# Patient Record
Sex: Female | Born: 1990 | Race: White | Hispanic: No | Smoking: Former smoker
Health system: Southern US, Community
[De-identification: ages and names within clinical notes are randomized; demographics above are authoritative.]

## PROBLEM LIST (undated history)

## (undated) ENCOUNTER — Inpatient Hospital Stay: Payer: Self-pay

## (undated) DIAGNOSIS — Q513 Bicornate uterus: Secondary | ICD-10-CM

## (undated) DIAGNOSIS — K219 Gastro-esophageal reflux disease without esophagitis: Secondary | ICD-10-CM

## (undated) DIAGNOSIS — O99210 Obesity complicating pregnancy, unspecified trimester: Secondary | ICD-10-CM

## (undated) DIAGNOSIS — Z8489 Family history of other specified conditions: Secondary | ICD-10-CM

## (undated) DIAGNOSIS — G43909 Migraine, unspecified, not intractable, without status migrainosus: Secondary | ICD-10-CM

## (undated) DIAGNOSIS — A6000 Herpesviral infection of urogenital system, unspecified: Secondary | ICD-10-CM

## (undated) DIAGNOSIS — T4145XA Adverse effect of unspecified anesthetic, initial encounter: Secondary | ICD-10-CM

## (undated) DIAGNOSIS — T8859XA Other complications of anesthesia, initial encounter: Secondary | ICD-10-CM

## (undated) HISTORY — PX: STENT PLACEMENT ILIAC (ARMC HX): HXRAD1735

## (undated) HISTORY — PX: TONSILLECTOMY: SUR1361

## (undated) HISTORY — PX: OTHER SURGICAL HISTORY: SHX169

## (undated) HISTORY — DX: Migraine, unspecified, not intractable, without status migrainosus: G43.909

---
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
## 2011-02-28 ENCOUNTER — Emergency Department: Payer: Self-pay | Admitting: *Deleted

## 2011-03-02 ENCOUNTER — Emergency Department: Payer: Self-pay | Admitting: Internal Medicine

## 2011-09-06 ENCOUNTER — Emergency Department: Payer: Self-pay | Admitting: Emergency Medicine

## 2011-09-06 LAB — URINALYSIS, COMPLETE
Bilirubin,UR: NEGATIVE
Ketone: NEGATIVE
Leukocyte Esterase: NEGATIVE
Nitrite: NEGATIVE
Ph: 6 (ref 4.5–8.0)
Protein: NEGATIVE

## 2011-09-06 LAB — CBC
HGB: 12.3 g/dL (ref 12.0–16.0)
MCH: 29.8 pg (ref 26.0–34.0)
Platelet: 190 10*3/uL (ref 150–440)
RBC: 4.12 10*6/uL (ref 3.80–5.20)
WBC: 5.8 10*3/uL (ref 3.6–11.0)

## 2011-09-06 LAB — PREGNANCY, URINE: Pregnancy Test, Urine: NEGATIVE m[IU]/mL

## 2013-09-17 DIAGNOSIS — R1031 Right lower quadrant pain: Secondary | ICD-10-CM | POA: Insufficient documentation

## 2013-11-04 ENCOUNTER — Emergency Department: Payer: Self-pay | Admitting: Emergency Medicine

## 2013-11-04 LAB — URINALYSIS, COMPLETE
Bilirubin,UR: NEGATIVE
GLUCOSE, UR: NEGATIVE mg/dL (ref 0–75)
Ketone: NEGATIVE
Leukocyte Esterase: NEGATIVE
Nitrite: NEGATIVE
Ph: 5 (ref 4.5–8.0)
RBC,UR: 1097 /HPF (ref 0–5)
Specific Gravity: 1.016 (ref 1.003–1.030)

## 2013-11-04 LAB — CBC
HCT: 37.7 % (ref 35.0–47.0)
HGB: 12.3 g/dL (ref 12.0–16.0)
MCH: 24.7 pg — AB (ref 26.0–34.0)
MCHC: 32.7 g/dL (ref 32.0–36.0)
MCV: 76 fL — ABNORMAL LOW (ref 80–100)
Platelet: 217 10*3/uL (ref 150–440)
RBC: 4.99 10*6/uL (ref 3.80–5.20)
RDW: 17.7 % — AB (ref 11.5–14.5)
WBC: 8.1 10*3/uL (ref 3.6–11.0)

## 2014-09-08 ENCOUNTER — Emergency Department
Admission: EM | Admit: 2014-09-08 | Discharge: 2014-09-08 | Disposition: A | Discharge status: Home / Self Care | Payer: 59 | Attending: Student | Admitting: Student

## 2014-09-08 ENCOUNTER — Emergency Department: Payer: 59

## 2014-09-08 ENCOUNTER — Encounter: Payer: Self-pay | Admitting: Urgent Care

## 2014-09-08 DIAGNOSIS — K148 Other diseases of tongue: Secondary | ICD-10-CM

## 2014-09-08 DIAGNOSIS — R220 Localized swelling, mass and lump, head: Secondary | ICD-10-CM | POA: Insufficient documentation

## 2014-09-08 DIAGNOSIS — J029 Acute pharyngitis, unspecified: Secondary | ICD-10-CM | POA: Diagnosis present

## 2014-09-08 LAB — POCT RAPID STREP A: STREPTOCOCCUS, GROUP A SCREEN (DIRECT): NEGATIVE

## 2014-09-08 MED ORDER — KETOROLAC TROMETHAMINE 60 MG/2ML IM SOLN
INTRAMUSCULAR | Status: AC
  Filled 2014-09-08: qty 2

## 2014-09-08 MED ORDER — DEXAMETHASONE SODIUM PHOSPHATE 10 MG/ML IJ SOLN
INTRAMUSCULAR | Status: AC
  Filled 2014-09-08: qty 1

## 2014-09-08 MED ORDER — METHYLPREDNISOLONE 4 MG PO TBPK: ORAL_TABLET | ORAL | Status: DC

## 2014-09-08 MED ORDER — MAGIC MOUTHWASH W/LIDOCAINE: 5.0000 mL | Freq: Four times a day (QID) | ORAL | Status: DC

## 2014-09-08 MED ORDER — DEXAMETHASONE SODIUM PHOSPHATE 10 MG/ML IJ SOLN
10.0000 mg | Freq: Once | INTRAMUSCULAR | Status: AC
  Administered 2014-09-08: 10 mg via INTRAMUSCULAR

## 2014-09-08 MED ORDER — MAGIC MOUTHWASH
10.0000 mL | Freq: Once | ORAL | Status: AC
  Administered 2014-09-08: 10 mL via ORAL
  Filled 2014-09-08: qty 10

## 2014-09-08 MED ORDER — KETOROLAC TROMETHAMINE 60 MG/2ML IM SOLN
60.0000 mg | Freq: Once | INTRAMUSCULAR | Status: AC
  Administered 2014-09-08: 60 mg via INTRAMUSCULAR

## 2014-09-08 NOTE — ED Notes (Signed)
Patient with sore throat and sensation of laryngeal fullness since yesterday. (+) fever with a tmax of 103.

## 2014-09-08 NOTE — ED Provider Notes (Signed)
[facility] Emergency Department Provider Note  ____________________________________________  Time seen: Approximately 7:44 AM  I have reviewed the triage vital signs and the nursing notes.   HISTORY  Chief Complaint Sore Throat    HPI Sandy [name] is a 24 y.o. female she complains of 2 days of sore throat and fever. He states today she is unable to open her mouth wide. Patient states that she is having trouble swallowing her saliva and she feels a fullness in her neck. Patient states her pain is a 9/10 which increased when she tries to swallow or open her mouth. Patient states no provocative palliative measures for her complaint.   History reviewed. No pertinent past medical history.  There are no active problems to display for this patient.   Past Surgical History  Procedure Laterality Date  . Cesarean section      No current outpatient prescriptions on file.  Allergies Percocet  No family history on file.  Social History History  Substance Use Topics  . Smoking status: Never Smoker   . Smokeless tobacco: Not on file  . Alcohol Use: No    Review of Systems Constitutional: No fever/chills Eyes: No visual changes. ENT: Sore throat Cardiovascular: Denies chest pain. Respiratory: Denies shortness of breath. Gastrointestinal: No abdominal pain.  No nausea, no vomiting.  No diarrhea.  No constipation. Genitourinary: Negative for dysuria. Musculoskeletal: Negative for back pain. Skin: Negative for rash. Neurological: Negative for headaches, focal weakness or numbness. {Allergic/Immunilogical: Percocets 10-point ROS otherwise negative.  ____________________________________________   PHYSICAL EXAM:  VITAL SIGNS: ED Triage Vitals  Enc Vitals Group     BP 09/08/14 0620 130/53 mmHg     Pulse Rate 09/08/14 0620 98     Resp 09/08/14 0620 22     Temp 09/08/14 0620 98.9 F (37.2 C)     Temp Source 09/08/14 0620 Oral     SpO2 09/08/14  0620 98 %     Weight 09/08/14 0620 250 lb (113.399 kg)     Height 09/08/14 0620 5\' 6"  (1.676 m)     Head Cir --      Peak Flow --      Pain Score 09/08/14 0634 9     Pain Loc --      Pain Edu? --      Excl. in GC? --     Constitutional: Alert and oriented. Well appearing and in no acute distress. Eyes: Conjunctivae are normal. PERRL. EOMI. Head: Atraumatic. Nose: No congestion/rhinnorhea. Mouth/Throat: Mucous membranes are moist.  Oropharynx non-erythematous. Decreased range of motion of the mouth secondary to complain of pain. Tonsils are absent. Neck: No stridor. No deformity. Nuchal range of motion nontender palpation Hematological/Lymphatic/Immunilogical: No cervical lymphadenopathy. Cardiovascular: Normal rate, regular rhythm. Grossly normal heart sounds.  Good peripheral circulation. Respiratory: Normal respiratory effort.  No retractions. Lungs CTAB. Gastrointestinal: Soft and nontender. No distention. No abdominal bruits. No CVA tenderness. Musculoskeletal: No lower extremity tenderness nor edema.  No joint effusions. Neurologic:  Normal speech and language. No gross focal neurologic deficits are appreciated. Speech is normal. No gait instability. Skin:  Skin is warm, dry and intact. No rash noted. Psychiatric: Mood and affect are normal. Speech and behavior are normal.  ____________________________________________   LABS (all labs ordered are listed, but only abnormal results are displayed)  Labs Reviewed  CULTURE, GROUP A STREP ([facility] ONLY)  POCT RAPID STREP A   ____________________________________________  EKG   ____________________________________________  RADIOLOGY  Soft tissue neck x-ray  unremarkable except for edema at the base of the tongue. ____________________________________________   PROCEDURES  Procedure(s) performed: None  Critical Care performed: No  ____________________________________________   INITIAL IMPRESSION / ASSESSMENT AND PLAN /  ED COURSE  Pertinent labs & imaging results that were available during my care of the patient were reviewed by me and considered in my medical decision making (see chart for details). Swollen tongue ____________________________________________   FINAL CLINICAL IMPRESSION(S) / ED DIAGNOSES  Final diagnoses:  Tongue edema      Joni Reining, PA-C 09/08/14 0845  Gayla Doss, MD 09/08/14 1453

## 2014-09-08 NOTE — ED Notes (Signed)
Past 2 days c/o sorethroat, pt unable to open mouth enough to visualize

## 2014-09-08 NOTE — Discharge Instructions (Signed)
Take medications as directed and follow up with ENT clinic if condition worsen.

## 2014-09-10 LAB — CULTURE, GROUP A STREP (THRC)

## 2014-09-19 ENCOUNTER — Emergency Department
Admission: EM | Admit: 2014-09-19 | Discharge: 2014-09-19 | Disposition: A | Discharge status: Home / Self Care | Payer: 59 | Attending: Emergency Medicine | Admitting: Emergency Medicine

## 2014-09-19 ENCOUNTER — Encounter: Payer: Self-pay | Admitting: *Deleted

## 2014-09-19 DIAGNOSIS — A6009 Herpesviral infection of other urogenital tract: Secondary | ICD-10-CM

## 2014-09-19 DIAGNOSIS — Z79899 Other long term (current) drug therapy: Secondary | ICD-10-CM | POA: Insufficient documentation

## 2014-09-19 DIAGNOSIS — A6004 Herpesviral vulvovaginitis: Secondary | ICD-10-CM | POA: Insufficient documentation

## 2014-09-19 DIAGNOSIS — Z3202 Encounter for pregnancy test, result negative: Secondary | ICD-10-CM | POA: Insufficient documentation

## 2014-09-19 DIAGNOSIS — N898 Other specified noninflammatory disorders of vagina: Secondary | ICD-10-CM | POA: Diagnosis present

## 2014-09-19 LAB — URINALYSIS COMPLETE WITH MICROSCOPIC (ARMC ONLY)
BILIRUBIN URINE: NEGATIVE
GLUCOSE, UA: NEGATIVE mg/dL
Hgb urine dipstick: NEGATIVE
Ketones, ur: NEGATIVE mg/dL
NITRITE: NEGATIVE
Protein, ur: NEGATIVE mg/dL
Specific Gravity, Urine: 1.018 (ref 1.005–1.030)
pH: 7 (ref 5.0–8.0)

## 2014-09-19 LAB — CHLAMYDIA/NGC RT PCR (ARMC ONLY)
Chlamydia Tr: NOT DETECTED
N gonorrhoeae: NOT DETECTED

## 2014-09-19 LAB — HCG, QUANTITATIVE, PREGNANCY: hCG, Beta Chain, Quant, S: <1 m[IU]/mL (ref <5)

## 2014-09-19 LAB — WET PREP, GENITAL
Clue Cells Wet Prep HPF POC: NONE SEEN
TRICH WET PREP: NONE SEEN
YEAST WET PREP: NONE SEEN

## 2014-09-19 LAB — POCT PREGNANCY, URINE: Preg Test, Ur: NEGATIVE

## 2014-09-19 MED ORDER — ACYCLOVIR 200 MG PO CAPS: 200.0000 mg | ORAL_CAPSULE | Freq: Three times a day (TID) | ORAL | Status: AC

## 2014-09-19 NOTE — ED Provider Notes (Signed)
[facility] Emergency Department Provider Note  ____________________________________________  Time seen: Approximately 8:40 PM  I have reviewed the triage vital signs and the nursing notes.   HISTORY  Chief Complaint Back Pain and Vaginal Discharge    HPI Sandy [name] is a 24 y.o. female who presents to the emergency department for lower back pain, vaginal discharge, and to confirm pregnancy. She states that a home pregnancy test was "barely positive". Last menstrual cycle was the end of April into beginning of May. She also states that she is herpes positive and is having an outbreak. She has burning to the lesions with urination.   History reviewed. No pertinent past medical history.  There are no active problems to display for this patient.   Past Surgical History  Procedure Laterality Date  . Cesarean section      Current Outpatient Rx  Name  Route  Sig  Dispense  Refill  . acyclovir (ZOVIRAX) 200 MG capsule   Oral   Take 1 capsule (200 mg total) by mouth 3 (three) times daily.   30 capsule   0   . Alum & Mag Hydroxide-Simeth (MAGIC MOUTHWASH W/LIDOCAINE) SOLN   Oral   Take 5 mLs by mouth 4 (four) times daily.   100 mL   0   . methylPREDNISolone (MEDROL DOSEPAK) 4 MG TBPK tablet      Take Tapered dose as directed   21 tablet   0     Allergies Percocet  No family history on file.  Social History History  Substance Use Topics  . Smoking status: Never Smoker   . Smokeless tobacco: Not on file  . Alcohol Use: No    Review of Systems Constitutional: No fever/chills Cardiovascular: Denies chest pain. Respiratory: Denies shortness of breath or cough. Gastrointestinal: Abdominal pain no., nausea no, vomitingno. Genitourinary: Dysuria no, vaginal discharge yes.. Musculoskeletal: Negative for back pain. Skin: Negative for rash. Neurological: Negative for headaches, focal weakness or numbness.  10-point ROS otherwise  negative.  ____________________________________________   PHYSICAL EXAM:  VITAL SIGNS: ED Triage Vitals  Enc Vitals Group     BP 09/19/14 1959 150/91 mmHg     Pulse Rate 09/19/14 1959 87     Resp 09/19/14 1959 20     Temp 09/19/14 1959 99.1 F (37.3 C)     Temp src --      SpO2 09/19/14 1959 98 %     Weight 09/19/14 1959 260 lb (117.935 kg)     Height 09/19/14 1959 5\' 6"  (1.676 m)     Head Cir --      Peak Flow --      Pain Score 09/19/14 2001 4     Pain Loc --      Pain Edu? --      Excl. in GC? --     Constitutional: Alert and oriented. Well appearing and in no acute distress. Eyes: Conjunctivae are normal. PERRL. EOMI. Head: Atraumatic. Nose: No congestion/rhinnorhea. Mouth/Throat: Mucous membranes are moist.  Oropharynx non-erythematous. Neck: No stridor. Cardiovascular: Good peripheral circulation. Respiratory: Normal respiratory effort.  No retractions. Gastrointestinal: Soft and nontender. No distention. No abdominal bruits. Genitourinary: Pelvic exam: 2 herpetic lesions present to right upper labia majora, thin clear discharge noted on exam. No bleeding in the vaginal vault or cervical bleeding. Cervix is closed. No adnexal tenderness present on exam. Musculoskeletal: No extremity tenderness nor edema.  Neurologic:  Normal speech and language. No gross focal neurologic deficits are appreciated.  Speech is normal. No gait instability. Skin:  Skin is warm, dry and intact. No rash noted. Psychiatric: Mood and affect are normal. Speech and behavior are normal.  ____________________________________________   LABS (all labs ordered are listed, but only abnormal results are displayed)  Labs Reviewed  WET PREP, GENITAL - Abnormal; Notable for the following:    WBC, Wet Prep HPF POC RARE (*)    All other components within normal limits  URINALYSIS COMPLETEWITH MICROSCOPIC ([facility] ONLY) - Abnormal; Notable for the following:    Color, Urine YELLOW (*)    APPearance  CLOUDY (*)    Leukocytes, UA TRACE (*)    Bacteria, UA RARE (*)    Squamous Epithelial / LPF 6-30 (*)    All other components within normal limits  CHLAMYDIA/NGC RT PCR ([facility] ONLY)  HCG, QUANTITATIVE, PREGNANCY  POC URINE PREG, ED  POCT PREGNANCY, URINE   ____________________________________________  RADIOLOGY  Not indicated ____________________________________________   PROCEDURES  Procedure(s) performed: Pelvic exam completed see assessment.  ____________________________________________   INITIAL IMPRESSION / ASSESSMENT AND PLAN / ED COURSE  Pertinent labs & imaging results that were available during my care of the patient were reviewed by me and considered in my medical decision making (see chart for details).  Beta hCG serum testing ordered to confirm pregnancy. GC and chlamydia specimens obtained as well as wet prep. Awaiting results.  Patient was discharged home after having a negative serum beta hCG and a negative wet prep. She was advised that she did have a small amount of bacteria and the wet prep. She was advised that the [facility] and Chlamydia screenings are still in progress and will not be available until tomorrow. She is to follow-up with her gynecologist to discuss management of her herpes outbreak. She received a prescription for acyclovir tonight. ____________________________________________   FINAL CLINICAL IMPRESSION(S) / ED DIAGNOSES  Final diagnoses:  Herpes genitalis in women      [name], FNP 09/19/14 2238  [name], MD 09/20/14 [phone]

## 2014-09-19 NOTE — ED Notes (Signed)
Pt has low back pain.  Pt reports vaginal discharge and positive home pregnancy test today.  No vag bleeding now.  Pt has dysuria.

## 2014-09-19 NOTE — Discharge Instructions (Signed)
Genital Herpes °Genital herpes is a sexually transmitted disease. This means that it is a disease passed by having sex with an infected person. There is no cure for genital herpes. The time between attacks can be months to years. The virus may live in a person but produce no problems (symptoms). This infection can be passed to a baby as it travels down the birth canal (vagina). In a newborn, this can cause central nervous system damage, eye damage, or even death. The virus that causes genital herpes is usually HSV-2 virus. The virus that causes oral herpes is usually HSV-1. The diagnosis (learning what is wrong) is made through culture results. °SYMPTOMS  °Usually symptoms of pain and itching begin a few days to a week after contact. It first appears as small blisters that progress to small painful ulcers which then scab over and heal after several days. It affects the outer genitalia, birth canal, cervix, penis, anal area, buttocks, and thighs. °HOME CARE INSTRUCTIONS  °· Keep ulcerated areas dry and clean. °· Take medications as directed. Antiviral medications can speed up healing. They will not prevent recurrences or cure this infection. These medications can also be taken for suppression if there are frequent recurrences. °· While the infection is active, it is contagious. Avoid all sexual contact during active infections. °· Condoms may help prevent spread of the herpes virus. °· Practice safe sex. °· Wash your hands thoroughly after touching the genital area. °· Avoid touching your eyes after touching your genital area. °· Inform your caregiver if you have had genital herpes and become pregnant. It is your responsibility to insure a safe outcome for your baby in this pregnancy. °· Only take over-the-counter or prescription medicines for pain, discomfort, or fever as directed by your caregiver. °SEEK MEDICAL CARE IF:  °· You have a recurrence of this infection. °· You do not respond to medications and are not  improving. °· You have new sources of pain or discharge which have changed from the original infection. °· You have an oral temperature above 102° F (38.9° C). °· You develop abdominal pain. °· You develop eye pain or signs of eye infection. °Document Released: 03/23/2000 Document Revised: 06/18/2011 Document Reviewed: 04/13/2009 °ExitCare® Patient Information ©2015 [employer]. This information is not intended to replace advice given to you by your health care provider. Make sure you discuss any questions you have with your health care provider. ° °

## 2014-09-19 NOTE — ED Notes (Signed)
Pt c/o low back pain, recent positive pregnancy test, dysuria and recent hx of allergic reaction to abx and uterine fibroids.

## 2015-03-01 ENCOUNTER — Encounter: Payer: Self-pay | Admitting: *Deleted

## 2015-03-01 ENCOUNTER — Emergency Department: Payer: 59

## 2015-03-01 ENCOUNTER — Emergency Department
Admission: EM | Admit: 2015-03-01 | Discharge: 2015-03-01 | Disposition: A | Discharge status: Home / Self Care | Payer: 59 | Attending: Emergency Medicine | Admitting: Emergency Medicine

## 2015-03-01 DIAGNOSIS — Z3202 Encounter for pregnancy test, result negative: Secondary | ICD-10-CM | POA: Insufficient documentation

## 2015-03-01 DIAGNOSIS — R1011 Right upper quadrant pain: Secondary | ICD-10-CM | POA: Diagnosis not present

## 2015-03-01 DIAGNOSIS — R1013 Epigastric pain: Secondary | ICD-10-CM | POA: Insufficient documentation

## 2015-03-01 DIAGNOSIS — R109 Unspecified abdominal pain: Secondary | ICD-10-CM | POA: Diagnosis present

## 2015-03-01 DIAGNOSIS — Z79899 Other long term (current) drug therapy: Secondary | ICD-10-CM | POA: Insufficient documentation

## 2015-03-01 DIAGNOSIS — R110 Nausea: Secondary | ICD-10-CM

## 2015-03-01 LAB — CBC
HCT: 40.2 % (ref 35.0–47.0)
Hemoglobin: 13.4 g/dL (ref 12.0–16.0)
MCH: 25.7 pg — AB (ref 26.0–34.0)
MCHC: 33.4 g/dL (ref 32.0–36.0)
MCV: 76.8 fL — AB (ref 80.0–100.0)
Platelets: 206 10*3/uL (ref 150–440)
RBC: 5.23 MIL/uL — ABNORMAL HIGH (ref 3.80–5.20)
RDW: 16.3 % — AB (ref 11.5–14.5)
WBC: 8 10*3/uL (ref 3.6–11.0)

## 2015-03-01 LAB — URINALYSIS COMPLETE WITH MICROSCOPIC (ARMC ONLY)
Bilirubin Urine: NEGATIVE
GLUCOSE, UA: NEGATIVE mg/dL
HGB URINE DIPSTICK: NEGATIVE
Leukocytes, UA: NEGATIVE
Nitrite: NEGATIVE
PH: 5 (ref 5.0–8.0)
Protein, ur: NEGATIVE mg/dL
Specific Gravity, Urine: 1.04 — ABNORMAL HIGH (ref 1.005–1.030)

## 2015-03-01 LAB — COMPREHENSIVE METABOLIC PANEL
ALBUMIN: 3.8 g/dL (ref 3.5–5.0)
ALT: 14 U/L (ref 14–54)
AST: 18 U/L (ref 15–41)
Alkaline Phosphatase: 89 U/L (ref 38–126)
Anion gap: 6 (ref 5–15)
BUN: 9 mg/dL (ref 6–20)
CHLORIDE: 105 mmol/L (ref 101–111)
CO2: 27 mmol/L (ref 22–32)
Calcium: 9.1 mg/dL (ref 8.9–10.3)
Creatinine, Ser: 0.7 mg/dL (ref 0.44–1.00)
GFR calc Af Amer: >60 mL/min (ref >60)
Glucose, Bld: 103 mg/dL — ABNORMAL HIGH (ref 65–99)
POTASSIUM: 3.5 mmol/L (ref 3.5–5.1)
SODIUM: 138 mmol/L (ref 135–145)
Total Bilirubin: 0.5 mg/dL (ref 0.3–1.2)
Total Protein: 7.3 g/dL (ref 6.5–8.1)

## 2015-03-01 LAB — LIPASE, BLOOD: LIPASE: 23 U/L (ref 11–51)

## 2015-03-01 LAB — POCT PREGNANCY, URINE: PREG TEST UR: NEGATIVE

## 2015-03-01 MED ORDER — PANTOPRAZOLE SODIUM 40 MG PO TBEC: 40.0000 mg | DELAYED_RELEASE_TABLET | Freq: Every day | ORAL | Status: DC

## 2015-03-01 MED ORDER — GI COCKTAIL ~~LOC~~
30.0000 mL | Freq: Once | ORAL | Status: AC
  Administered 2015-03-01: 30 mL via ORAL
  Filled 2015-03-01: qty 30

## 2015-03-01 MED ORDER — ONDANSETRON 4 MG PO TBDP
4.0000 mg | ORAL_TABLET | Freq: Once | ORAL | Status: AC
Start: 2015-03-01 — End: 2015-03-01
  Administered 2015-03-01: 4 mg via ORAL

## 2015-03-01 MED ORDER — GI COCKTAIL ~~LOC~~: 30.0000 mL | Freq: Once | ORAL | Status: DC

## 2015-03-01 MED ORDER — ONDANSETRON 4 MG PO TBDP
ORAL_TABLET | ORAL | Status: AC
  Administered 2015-03-01: 4 mg via ORAL
  Filled 2015-03-01: qty 1

## 2015-03-01 MED ORDER — ONDANSETRON HCL 4 MG PO TABS: 4.0000 mg | ORAL_TABLET | Freq: Every day | ORAL | Status: DC | PRN

## 2015-03-01 MED ORDER — SUCRALFATE 1 G PO TABS: 1.0000 g | ORAL_TABLET | Freq: Four times a day (QID) | ORAL | Status: DC

## 2015-03-01 NOTE — Discharge Instructions (Signed)
Please seek medical attention for any high fevers, chest pain, shortness of breath, change in behavior, persistent vomiting, bloody stool or any other new or concerning symptoms. ° ° °Abdominal Pain, Adult °Many things can cause abdominal pain. Usually, abdominal pain is not caused by a disease and will improve without treatment. It can often be observed and treated at home. Your health care provider will do a physical exam and possibly order blood tests and X-rays to help determine the seriousness of your pain. However, in many cases, more time must pass before a clear cause of the pain can be found. Before that point, your health care provider may not know if you need more testing or further treatment. °HOME CARE INSTRUCTIONS °Monitor your abdominal pain for any changes. The following actions may help to alleviate any discomfort you are experiencing: °· Only take over-the-counter or prescription medicines as directed by your health care provider. °· Do not take laxatives unless directed to do so by your health care provider. °· Try a clear liquid diet (broth, tea, or water) as directed by your health care provider. Slowly move to a bland diet as tolerated. °SEEK MEDICAL CARE IF: °· You have unexplained abdominal pain. °· You have abdominal pain associated with nausea or diarrhea. °· You have pain when you urinate or have a bowel movement. °· You experience abdominal pain that wakes you in the night. °· You have abdominal pain that is worsened or improved by eating food. °· You have abdominal pain that is worsened with eating fatty foods. °· You have a fever. °SEEK IMMEDIATE MEDICAL CARE IF: °· Your pain does not go away within 2 hours. °· You keep throwing up (vomiting). °· Your pain is felt only in portions of the abdomen, such as the right side or the left lower portion of the abdomen. °· You pass bloody or black tarry stools. °MAKE SURE YOU: °· Understand these instructions. °· Will watch your  condition. °· Will get help right away if you are not doing well or get worse. °  °This information is not intended to replace advice given to you by your health care provider. Make sure you discuss any questions you have with your health care provider. °  °Document Released: 01/03/2005 Document Revised: 12/15/2014 Document Reviewed: 12/03/2012 °Elsevier Interactive Patient Education ©2016 [employer] ° °

## 2015-03-01 NOTE — ED Provider Notes (Signed)
[facility] Emergency Department Provider Note    ____________________________________________  Time seen: 1950  I have reviewed the triage vital signs and the nursing notes.   HISTORY  Chief Complaint Abdominal Cramping   History limited by: Not Limited   HPI Sandy [name] is a 24 y.o. female who presents to the emergency department today with concerns for right upper quadrant and abdominal pain. The patient states that the pain has been present for the past 1-2 weeks. She describes it as starting in the epigastric before radiating into the right upper quadrant. I will then sometimes go down her stomach and towards her back. She states it is worse when she eats food in particular she mentioned fried chicken as a trigger. She states it has been intermittent. She denies similar pain prior to 2 weeks ago. She denies any recent alcohol ingestion or over-the-counter pain medication. States her uncle had a history of gallstones. She denies any fevers.   History reviewed. No pertinent past medical history.  There are no active problems to display for this patient.   Past Surgical History  Procedure Laterality Date  . Cesarean section    . Cesarean section      Current Outpatient Rx  Name  Route  Sig  Dispense  Refill  . Alum & Mag Hydroxide-Simeth (MAGIC MOUTHWASH W/LIDOCAINE) SOLN   Oral   Take 5 mLs by mouth 4 (four) times daily.   100 mL   0   . methylPREDNISolone (MEDROL DOSEPAK) 4 MG TBPK tablet      Take Tapered dose as directed   21 tablet   0     Allergies Percocet  No family history on file.  Social History Social History  Substance Use Topics  . Smoking status: Never Smoker   . Smokeless tobacco: None  . Alcohol Use: No    Review of Systems  Constitutional: Negative for fever. Cardiovascular: Negative for chest pain. Respiratory: Negative for shortness of breath. Gastrointestinal: Positive for epigastric right upper  quadrant pain Genitourinary: Negative for dysuria. Musculoskeletal: Negative for back pain. Skin: Negative for rash. Neurological: Negative for headaches, focal weakness or numbness.  10-point ROS otherwise negative.  ____________________________________________   PHYSICAL EXAM:  VITAL SIGNS: ED Triage Vitals  Enc Vitals Group     BP 03/01/15 1903 135/84 mmHg     Pulse Rate 03/01/15 1903 97     Resp 03/01/15 1903 20     Temp 03/01/15 1903 98.7 F (37.1 C)     Temp Source 03/01/15 1903 Oral     SpO2 03/01/15 1903 100 %     Weight 03/01/15 1903 240 lb (108.863 kg)     Height 03/01/15 1903  (1.676 m)     Head Cir --      Peak Flow --      Pain Score 03/01/15 1904 9   Constitutional: Alert and oriented. Well appearing and in no distress. Eyes: Conjunctivae are normal. PERRL. Normal extraocular movements. ENT   Head: Normocephalic and atraumatic.   Nose: No congestion/rhinnorhea.   Mouth/Throat: Mucous membranes are moist.   Neck: No stridor. Hematological/Lymphatic/Immunilogical: No cervical lymphadenopathy. Cardiovascular: Normal rate, regular rhythm.  No murmurs, rubs, or gallops. Respiratory: Normal respiratory effort without tachypnea nor retractions. Breath sounds are clear and equal bilaterally. No wheezes/rales/rhonchi. Gastrointestinal: Soft. Tender to palpation in the epigastric and right upper quadrant. Genitourinary: Deferred Musculoskeletal: Normal range of motion in all extremities. No joint effusions.  No lower extremity tenderness  nor edema. Neurologic:  Normal speech and language. No gross focal neurologic deficits are appreciated.  Skin:  Skin is warm, dry and intact. No rash noted. Psychiatric: Mood and affect are normal. Speech and behavior are normal. Patient exhibits appropriate insight and judgment.  ____________________________________________    LABS (pertinent positives/negatives)  Labs Reviewed  COMPREHENSIVE METABOLIC PANEL -  Abnormal; Notable for the following:    Glucose, Bld 103 (*)    All other components within normal limits  CBC - Abnormal; Notable for the following:    RBC 5.23 (*)    MCV 76.8 (*)    MCH 25.7 (*)    RDW 16.3 (*)    All other components within normal limits  URINALYSIS COMPLETEWITH MICROSCOPIC ([facility] ONLY) - Abnormal; Notable for the following:    Color, Urine YELLOW (*)    APPearance CLEAR (*)    Ketones, ur TRACE (*)    Specific Gravity, Urine 1.040 (*)    Bacteria, UA RARE (*)    Squamous Epithelial / LPF 6-30 (*)    All other components within normal limits  LIPASE, BLOOD  POC URINE PREG, ED  POCT PREGNANCY, URINE     ____________________________________________   EKG  I, Phineas SemenGraydon Brainard Highfill, attending physician, personally viewed and interpreted this EKG  EKG Time: 1930 Rate: 87 Rhythm: Normal sinus rhythm Axis: normal Intervals: qtc 423 QRS: narrow ST changes: no st elevation Impression: normal ekg ____________________________________________    RADIOLOGY  RUQ US  IMPRESSION: No evidence of cholelithiasis or cholecystitis. Probable fatty infiltration in the liver.   ____________________________________________   PROCEDURES  Procedure(s) performed: None  Critical Care performed: No  ____________________________________________   INITIAL IMPRESSION / ASSESSMENT AND PLAN / ED COURSE  Pertinent labs & imaging results that were available during my care of the patient were reviewed by me and considered in my medical decision making (see chart for details).  Patient presented to the emergency department today with right upper quadrant and epigastric pain that was worse after eating. Exam patient was tender in the epigastric right upper quadrant. An ultrasound was obtained however did not show any signs of gallstones. Additionally blood work including lipase and LFTs within normal limits. No leukocytosis. Patient afebrile. This point I think potentially  patient is suffering from gastritis. Will give patient an acids and sucralfate. Additionally did discuss with patient importance of following up with GI given that I do have some thought it could still be biliary in nature.  ____________________________________________   FINAL CLINICAL IMPRESSION(S) / ED DIAGNOSES  Final diagnoses:  RUQ pain  Abdominal pain, unspecified abdominal location  Nausea     Phineas SemenGraydon Zayne Draheim, MD 03/01/15 2141

## 2015-03-01 NOTE — ED Notes (Signed)
Pain across upper abd and down right flank

## 2015-03-28 ENCOUNTER — Encounter: Payer: Self-pay | Admitting: Emergency Medicine

## 2015-03-28 ENCOUNTER — Emergency Department
Admission: EM | Admit: 2015-03-28 | Discharge: 2015-03-28 | Disposition: A | Discharge status: Home / Self Care | Payer: 59 | Attending: Student | Admitting: Student

## 2015-03-28 ENCOUNTER — Emergency Department: Payer: 59

## 2015-03-28 DIAGNOSIS — O039 Complete or unspecified spontaneous abortion without complication: Secondary | ICD-10-CM | POA: Insufficient documentation

## 2015-03-28 DIAGNOSIS — R1031 Right lower quadrant pain: Secondary | ICD-10-CM | POA: Diagnosis not present

## 2015-03-28 DIAGNOSIS — O200 Threatened abortion: Secondary | ICD-10-CM

## 2015-03-28 DIAGNOSIS — R109 Unspecified abdominal pain: Secondary | ICD-10-CM

## 2015-03-28 DIAGNOSIS — Z3A01 Less than 8 weeks gestation of pregnancy: Secondary | ICD-10-CM | POA: Insufficient documentation

## 2015-03-28 DIAGNOSIS — O9989 Other specified diseases and conditions complicating pregnancy, childbirth and the puerperium: Secondary | ICD-10-CM | POA: Diagnosis present

## 2015-03-28 DIAGNOSIS — O26899 Other specified pregnancy related conditions, unspecified trimester: Secondary | ICD-10-CM

## 2015-03-28 DIAGNOSIS — Z79899 Other long term (current) drug therapy: Secondary | ICD-10-CM | POA: Insufficient documentation

## 2015-03-28 DIAGNOSIS — Z7952 Long term (current) use of systemic steroids: Secondary | ICD-10-CM | POA: Insufficient documentation

## 2015-03-28 DIAGNOSIS — R52 Pain, unspecified: Secondary | ICD-10-CM

## 2015-03-28 LAB — COMPREHENSIVE METABOLIC PANEL
ALT: 18 U/L (ref 14–54)
ANION GAP: 6 (ref 5–15)
AST: 16 U/L (ref 15–41)
Albumin: 3.6 g/dL (ref 3.5–5.0)
Alkaline Phosphatase: 66 U/L (ref 38–126)
BILIRUBIN TOTAL: 0.4 mg/dL (ref 0.3–1.2)
BUN: 9 mg/dL (ref 6–20)
CO2: 24 mmol/L (ref 22–32)
Calcium: 8.6 mg/dL — ABNORMAL LOW (ref 8.9–10.3)
Chloride: 107 mmol/L (ref 101–111)
Creatinine, Ser: 0.68 mg/dL (ref 0.44–1.00)
GFR calc Af Amer: >60 mL/min (ref >60)
Glucose, Bld: 91 mg/dL (ref 65–99)
POTASSIUM: 3.6 mmol/L (ref 3.5–5.1)
Sodium: 137 mmol/L (ref 135–145)
TOTAL PROTEIN: 7.1 g/dL (ref 6.5–8.1)

## 2015-03-28 LAB — URINALYSIS COMPLETE WITH MICROSCOPIC (ARMC ONLY)
Bilirubin Urine: NEGATIVE
GLUCOSE, UA: NEGATIVE mg/dL
Hgb urine dipstick: NEGATIVE
NITRITE: NEGATIVE
Protein, ur: NEGATIVE mg/dL
SPECIFIC GRAVITY, URINE: 1.033 — AB (ref 1.005–1.030)
pH: 5 (ref 5.0–8.0)

## 2015-03-28 LAB — CBC WITH DIFFERENTIAL/PLATELET
Basophils Absolute: 0 10*3/uL (ref 0–0.1)
Basophils Relative: 1 %
Eosinophils Absolute: 0.2 10*3/uL (ref 0–0.7)
Eosinophils Relative: 3 %
HEMATOCRIT: 38.5 % (ref 35.0–47.0)
Hemoglobin: 13 g/dL (ref 12.0–16.0)
LYMPHS ABS: 1.8 10*3/uL (ref 1.0–3.6)
LYMPHS PCT: 24 %
MCH: 26.3 pg (ref 26.0–34.0)
MCHC: 33.6 g/dL (ref 32.0–36.0)
MCV: 78.3 fL — AB (ref 80.0–100.0)
MONO ABS: 0.5 10*3/uL (ref 0.2–0.9)
MONOS PCT: 6 %
NEUTROS ABS: 5.1 10*3/uL (ref 1.4–6.5)
Neutrophils Relative %: 66 %
Platelets: 195 10*3/uL (ref 150–440)
RBC: 4.92 MIL/uL (ref 3.80–5.20)
RDW: 16.8 % — AB (ref 11.5–14.5)
WBC: 7.6 10*3/uL (ref 3.6–11.0)

## 2015-03-28 LAB — WET PREP, GENITAL
Clue Cells Wet Prep HPF POC: NONE SEEN
SPERM: NONE SEEN
Trich, Wet Prep: NONE SEEN
YEAST WET PREP: NONE SEEN

## 2015-03-28 LAB — HCG, QUANTITATIVE, PREGNANCY: hCG, Beta Chain, Quant, S: 17111 m[IU]/mL — ABNORMAL HIGH (ref <5)

## 2015-03-28 LAB — LIPASE, BLOOD: LIPASE: 21 U/L (ref 11–51)

## 2015-03-28 LAB — ABO/RH: ABO/RH(D): A POS

## 2015-03-28 LAB — PREGNANCY, URINE: Preg Test, Ur: POSITIVE — AB

## 2015-03-28 NOTE — ED Notes (Signed)
Pt presents with abdominal pain x 2 days. States she was seen here two weeks ago and was given medications for pain and nausea but is now out. Pt alert & oriented with warm, dry skin. NAD noted.

## 2015-03-28 NOTE — ED Notes (Signed)
Developed some abd cramping and some vaginal spotting this am. Positive preg

## 2015-03-28 NOTE — ED Notes (Addendum)
Pt reports being seen here a few weeks ago and given meds that help her abd pain and nausea. States now that the meds are gone the pain is back. States she took a pregnancy test today and it was positive. States she is worried that taking those meds have hurt her baby

## 2015-03-28 NOTE — ED Provider Notes (Signed)
[facility] Emergency Department Provider Note  ____________________________________________  Time seen: Approximately 5:26 PM  I have reviewed the triage vital signs and the nursing notes.   HISTORY  Chief Complaint Abdominal Pain    HPI Sandy [name] is a 24 y.o. female with history of ovarian cysts presents for evaluation of right lower quadrant abdominal pain today as well as some light vaginal spotting, gradual onset today, intermittent, currently mild, no modifying factors. Last menstrual period was in October and she took a pregnancy test today and it was positive. She denies any nausea, vomiting, diarrhea, fevers or chills. She was seen here on 03/01/2015 with right upper quadrant and epigastric pain with a normal ultrasound and was discharged with a diagnosis of gastritis as well as with medications to treat her symptoms. She reports that pain has resolved.   History reviewed. No pertinent past medical history.  There are no active problems to display for this patient.   Past Surgical History  Procedure Laterality Date  . Cesarean section    . Cesarean section      Current Outpatient Rx  Name  Route  Sig  Dispense  Refill  . Alum & Mag Hydroxide-Simeth (MAGIC MOUTHWASH W/LIDOCAINE) SOLN   Oral   Take 5 mLs by mouth 4 (four) times daily.   100 mL   0   . methylPREDNISolone (MEDROL DOSEPAK) 4 MG TBPK tablet      Take Tapered dose as directed   21 tablet   0   . ondansetron (ZOFRAN) 4 MG tablet   Oral   Take 1 tablet (4 mg total) by mouth daily as needed for nausea or vomiting.   20 tablet   0   . pantoprazole (PROTONIX) 40 MG tablet   Oral   Take 1 tablet (40 mg total) by mouth daily.   30 tablet   1   . sucralfate (CARAFATE) 1 G tablet   Oral   Take 1 tablet (1 g total) by mouth 4 (four) times daily.   60 tablet   0     Allergies Percocet  History reviewed. No pertinent family history.  Social History Social History   Substance Use Topics  . Smoking status: Never Smoker   . Smokeless tobacco: None  . Alcohol Use: No    Review of Systems Constitutional: No fever/chills Eyes: No visual changes. ENT: No sore throat. Cardiovascular: Denies chest pain. Respiratory: Denies shortness of breath. Gastrointestinal: + abdominal pain.  No nausea, no vomiting.  No diarrhea.  No constipation. Genitourinary: Negative for dysuria. Musculoskeletal: Negative for back pain. Skin: Negative for rash. Neurological: Negative for headaches, focal weakness or numbness.  10-point ROS otherwise negative.  ____________________________________________   PHYSICAL EXAM:  Filed Vitals:   03/28/15 1958 03/28/15 2056  BP: 118/78   Pulse: 83   Temp:  98.4 F (36.9 C)  TempSrc:  Oral  Resp: 20   SpO2: 98%     VITAL SIGNS: ED Triage Vitals  Enc Vitals Group     BP --      Pulse --      Resp --      Temp --      Temp src --      SpO2 --      Weight --      Height --      Head Cir --      Peak Flow --      Pain Score --  Pain Loc --      Pain Edu? --      Excl. in GC? --     Constitutional: Alert and oriented. Well appearing and in no acute distress. Eyes: Conjunctivae are normal. PERRL. EOMI. Head: Atraumatic. Nose: No congestion/rhinnorhea. Mouth/Throat: Mucous membranes are moist.  Oropharynx non-erythematous. Neck: No stridor. Cardiovascular: Normal rate, regular rhythm. Grossly normal heart sounds.  Good peripheral circulation. Respiratory: Normal respiratory effort.  No retractions. Lungs CTAB. Gastrointestinal: Soft and nontender. No distention.  No CVA tenderness. Pelvic: Scant white non-malodorous vaginal discharge from a closed os. No bimanual tenderness, no cervical motion tenderness. No blood. Musculoskeletal: No lower extremity tenderness nor edema.  No joint effusions. Neurologic:  Normal speech and language. No gross focal neurologic deficits are appreciated. No gait  instability. Skin:  Skin is warm, dry and intact. No rash noted. Psychiatric: Mood and affect are normal. Speech and behavior are normal.  ____________________________________________   LABS (all labs ordered are listed, but only abnormal results are displayed)  Labs Reviewed  WET PREP, GENITAL - Abnormal; Notable for the following:    WBC, Wet Prep HPF POC FEW (*)    All other components within normal limits  URINALYSIS COMPLETEWITH MICROSCOPIC ([facility] ONLY) - Abnormal; Notable for the following:    Color, Urine YELLOW (*)    APPearance HAZY (*)    Ketones, ur TRACE (*)    Specific Gravity, Urine 1.033 (*)    Leukocytes, UA TRACE (*)    Bacteria, UA RARE (*)    Squamous Epithelial / LPF 6-30 (*)    All other components within normal limits  PREGNANCY, URINE - Abnormal; Notable for the following:    Preg Test, Ur POSITIVE (*)    All other components within normal limits  CBC WITH DIFFERENTIAL/PLATELET - Abnormal; Notable for the following:    MCV 78.3 (*)    RDW 16.8 (*)    All other components within normal limits  COMPREHENSIVE METABOLIC PANEL - Abnormal; Notable for the following:    Calcium 8.6 (*)    All other components within normal limits  HCG, QUANTITATIVE, PREGNANCY - Abnormal; Notable for the following:    hCG, Beta Chain, Quant, S 17111 (*)    All other components within normal limits  CHLAMYDIA/NGC RT PCR ([facility] ONLY)  LIPASE, BLOOD  POC URINE PREG, ED  ABO/RH   ____________________________________________  EKG  none ____________________________________________  RADIOLOGY  TVUS  Pulsed Doppler evaluation of both ovaries demonstrates normal appearing low-resistance arterial and venous waveforms.  IMPRESSION: 1. Single intrauterine gestational sac containing a yolk sac and small fetal pole. While there is a suggestion of a flicker indicating cardiac activity, the fetal pole is too small to definitively measure a heart rate. By crown-rump  length, the estimated gestational age is 5 weeks 5 days. 2. 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 The gestational sac is in the left horn of the endometrium.  ____________________________________________   PROCEDURES  Procedure(s) performed: None  Critical Care performed: No  ____________________________________________   INITIAL IMPRESSION / ASSESSMENT AND PLAN / ED COURSE  Pertinent labs & imaging results that were available during my care of the patient were reviewed by me and considered in my medical decision making (see chart for details).  Reece LeaderJasmine M Sollars is a 24 y.o. female with history of ovarian cysts presents for evaluation of right lower quadrant abdominal pain today as well as some light vaginal spotting in the setting of positive pregnancy test.  On exam, she is very well-appearing and in no acute distress. Vital signs stable, she is afebrile. Abdominal exam is benign. Pelvic is unremarkable. She has a positive pregnancy test here. Plan for screening abdominal pain labs, we'll check Rh status, will obtain ultrasound to rule out ectopic.  ----------------------------------------- 8:52 PM on 03/28/2015 ----------------------------------------- Labs reviewed. Unremarkable CBC, CMP, lipase. Urinalysis has is a with infection. Wet prep negative. GC chlamydia pending. Beta hCG is pending but will not change management at this time. Single live intrauterine pregnancy is noted at 5 weeks and 5 days estimated gestational age. I discussed with the patient that she has an abnormal uterine shape which may be arcuate or bicornuate, she has had a successful pregnancy previously but I discussed the need for close OB/GYN follow-up. She is A+ blood type, no RhoGAM needed. We discussed return precautions, need for close follow-up, possibility of early miscarriage, she is comfort with the discharge  plan.  ____________________________________________   FINAL CLINICAL IMPRESSION(S) / ED DIAGNOSES  Final diagnoses:  Pain  Threatened miscarriage in early pregnancy  Abdominal pain in pregnancy      Gayla Doss, MD 03/28/15 2311

## 2015-03-30 ENCOUNTER — Telehealth: Payer: Self-pay | Admitting: Emergency Medicine

## 2015-03-30 NOTE — ED Notes (Signed)
Called patient to inform her that gc chlamydia test was not done due to invalid specimen.  She says she had visit with dr Tiburcio Peaharris already and they have her records, but she will inform them.

## 2015-08-07 ENCOUNTER — Observation Stay
Admission: EM | Admit: 2015-08-07 | Discharge: 2015-08-07 | Disposition: A | Discharge status: Home / Self Care | Payer: 59 | Attending: Certified Nurse Midwife | Admitting: Certified Nurse Midwife

## 2015-08-07 ENCOUNTER — Encounter: Payer: Self-pay | Admitting: Certified Nurse Midwife

## 2015-08-07 DIAGNOSIS — O9989 Other specified diseases and conditions complicating pregnancy, childbirth and the puerperium: Secondary | ICD-10-CM | POA: Diagnosis not present

## 2015-08-07 DIAGNOSIS — Z6841 Body Mass Index (BMI) 40.0 and over, adult: Secondary | ICD-10-CM | POA: Diagnosis not present

## 2015-08-07 DIAGNOSIS — S3991XA Unspecified injury of abdomen, initial encounter: Secondary | ICD-10-CM | POA: Diagnosis not present

## 2015-08-07 DIAGNOSIS — O34219 Maternal care for unspecified type scar from previous cesarean delivery: Secondary | ICD-10-CM | POA: Insufficient documentation

## 2015-08-07 DIAGNOSIS — Y998 Other external cause status: Secondary | ICD-10-CM | POA: Insufficient documentation

## 2015-08-07 DIAGNOSIS — Y9289 Other specified places as the place of occurrence of the external cause: Secondary | ICD-10-CM | POA: Diagnosis not present

## 2015-08-07 DIAGNOSIS — O99212 Obesity complicating pregnancy, second trimester: Secondary | ICD-10-CM | POA: Diagnosis not present

## 2015-08-07 DIAGNOSIS — Y9389 Activity, other specified: Secondary | ICD-10-CM | POA: Diagnosis not present

## 2015-08-07 DIAGNOSIS — E669 Obesity, unspecified: Secondary | ICD-10-CM | POA: Insufficient documentation

## 2015-08-07 DIAGNOSIS — O3402 Maternal care for unspecified congenital malformation of uterus, second trimester: Secondary | ICD-10-CM | POA: Diagnosis not present

## 2015-08-07 DIAGNOSIS — Z3A24 24 weeks gestation of pregnancy: Secondary | ICD-10-CM | POA: Diagnosis not present

## 2015-08-07 DIAGNOSIS — O9A212 Injury, poisoning and certain other consequences of external causes complicating pregnancy, second trimester: Secondary | ICD-10-CM

## 2015-08-07 DIAGNOSIS — B373 Candidiasis of vulva and vagina: Secondary | ICD-10-CM | POA: Diagnosis not present

## 2015-08-07 DIAGNOSIS — Q513 Bicornate uterus: Secondary | ICD-10-CM | POA: Insufficient documentation

## 2015-08-07 DIAGNOSIS — O98812 Other maternal infectious and parasitic diseases complicating pregnancy, second trimester: Secondary | ICD-10-CM | POA: Diagnosis not present

## 2015-08-07 DIAGNOSIS — O98312 Other infections with a predominantly sexual mode of transmission complicating pregnancy, second trimester: Secondary | ICD-10-CM | POA: Insufficient documentation

## 2015-08-07 DIAGNOSIS — A6000 Herpesviral infection of urogenital system, unspecified: Secondary | ICD-10-CM | POA: Diagnosis not present

## 2015-08-07 DIAGNOSIS — R109 Unspecified abdominal pain: Secondary | ICD-10-CM | POA: Diagnosis present

## 2015-08-07 HISTORY — DX: Herpesviral infection of urogenital system, unspecified: A60.00

## 2015-08-07 HISTORY — DX: Obesity complicating pregnancy, unspecified trimester: O99.210

## 2015-08-07 HISTORY — DX: Bicornate uterus: Q51.3

## 2015-08-07 LAB — CBC
HEMATOCRIT: 34.4 % — AB (ref 35.0–47.0)
Hemoglobin: 12.2 g/dL (ref 12.0–16.0)
MCH: 30.8 pg (ref 26.0–34.0)
MCHC: 35.3 g/dL (ref 32.0–36.0)
MCV: 87.2 fL (ref 80.0–100.0)
Platelets: 152 10*3/uL (ref 150–440)
RBC: 3.95 MIL/uL (ref 3.80–5.20)
RDW: 16.1 % — ABNORMAL HIGH (ref 11.5–14.5)
WBC: 7.6 10*3/uL (ref 3.6–11.0)

## 2015-08-07 LAB — KLEIHAUER-BETKE STAIN
# VIALS RHIG: 0
FETAL CELLS %: 0.2 %

## 2015-08-07 LAB — FIBRINOGEN: FIBRINOGEN: 383 mg/dL (ref 210–470)

## 2015-08-07 LAB — PROTIME-INR
INR: 1.11
Prothrombin Time: 14.5 seconds (ref 11.4–15.0)

## 2015-08-07 LAB — APTT: aPTT: 25 seconds (ref 24–36)

## 2015-08-07 LAB — FIBRIN DEGRADATION PROD.(ARMC ONLY): Fibrin Degradation Prod.: <10 (ref <10)
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
## 2015-08-08 ENCOUNTER — Observation Stay
Admission: EM | Admit: 2015-08-08 | Discharge: 2015-08-08 | Disposition: A | Discharge status: Home / Self Care | Payer: 59 | Source: Home / Self Care | Admitting: Obstetrics and Gynecology

## 2015-08-08 ENCOUNTER — Encounter: Payer: Self-pay | Admitting: *Deleted

## 2015-08-08 ENCOUNTER — Telehealth: Payer: Self-pay | Admitting: Obstetrics and Gynecology

## 2015-08-08 ENCOUNTER — Observation Stay
Admission: EM | Admit: 2015-08-08 | Discharge: 2015-08-08 | Disposition: A | Discharge status: Home / Self Care | Payer: 59 | Attending: Obstetrics and Gynecology | Admitting: Obstetrics and Gynecology

## 2015-08-08 DIAGNOSIS — Z3A24 24 weeks gestation of pregnancy: Secondary | ICD-10-CM | POA: Insufficient documentation

## 2015-08-08 DIAGNOSIS — O99212 Obesity complicating pregnancy, second trimester: Secondary | ICD-10-CM | POA: Insufficient documentation

## 2015-08-08 DIAGNOSIS — E669 Obesity, unspecified: Secondary | ICD-10-CM | POA: Diagnosis not present

## 2015-08-08 DIAGNOSIS — B373 Candidiasis of vulva and vagina: Secondary | ICD-10-CM | POA: Diagnosis not present

## 2015-08-08 DIAGNOSIS — N898 Other specified noninflammatory disorders of vagina: Secondary | ICD-10-CM | POA: Diagnosis present

## 2015-08-08 DIAGNOSIS — A6000 Herpesviral infection of urogenital system, unspecified: Secondary | ICD-10-CM | POA: Insufficient documentation

## 2015-08-08 DIAGNOSIS — Q513 Bicornate uterus: Secondary | ICD-10-CM | POA: Insufficient documentation

## 2015-08-08 DIAGNOSIS — O98812 Other maternal infectious and parasitic diseases complicating pregnancy, second trimester: Principal | ICD-10-CM | POA: Insufficient documentation

## 2015-08-08 DIAGNOSIS — Z885 Allergy status to narcotic agent status: Secondary | ICD-10-CM | POA: Insufficient documentation

## 2015-08-08 DIAGNOSIS — O23592 Infection of other part of genital tract in pregnancy, second trimester: Secondary | ICD-10-CM | POA: Diagnosis not present

## 2015-08-08 DIAGNOSIS — O34219 Maternal care for unspecified type scar from previous cesarean delivery: Secondary | ICD-10-CM | POA: Diagnosis not present

## 2015-08-08 DIAGNOSIS — B9689 Other specified bacterial agents as the cause of diseases classified elsewhere: Secondary | ICD-10-CM | POA: Insufficient documentation

## 2015-08-08 DIAGNOSIS — O3402 Maternal care for unspecified congenital malformation of uterus, second trimester: Secondary | ICD-10-CM | POA: Insufficient documentation

## 2015-08-08 DIAGNOSIS — O98312 Other infections with a predominantly sexual mode of transmission complicating pregnancy, second trimester: Secondary | ICD-10-CM | POA: Insufficient documentation

## 2015-08-08 DIAGNOSIS — O4690 Antepartum hemorrhage, unspecified, unspecified trimester: Secondary | ICD-10-CM | POA: Diagnosis present

## 2015-08-08 DIAGNOSIS — Z6841 Body Mass Index (BMI) 40.0 and over, adult: Secondary | ICD-10-CM | POA: Insufficient documentation

## 2015-08-08 MED ORDER — CITRIC ACID-SODIUM CITRATE 334-500 MG/5ML PO SOLN: 30.0000 mL | ORAL | Status: DC | PRN

## 2015-08-08 MED ORDER — LIDOCAINE HCL (PF) 1 % IJ SOLN: 30.0000 mL | INTRAMUSCULAR | Status: DC | PRN
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
## 2015-08-08 NOTE — Discharge Instructions (Signed)
Pt to return or call office/ [facility] [phone]) if continues to develop period like bleeding and/or cramping, or if further concerns/question develop. Pt to keep next appt- which is Monday April 2nd 2017. Pt to further observe c/o's of sore throat re pt questioning strep throat- no other symptoms presently. Pt may take Generic brand of Metamucil 1-2 x a day in regards to constipation.

## 2015-08-08 NOTE — Final Progress Note (Signed)
Obstetrics Admission History & Physical  08/08/2015 - 8:57 PM Primary OBGYN: [facility]  Chief Complaint: pink vagina discharge  History of Present Illness  25 y.o. G2P1001 @ [redacted]w[redacted]d, with the above CC.  See prior notes. Pt asked to come back for formal speculum evaluation. Still sore on the right side but not worse than before and nothing that's regular or consistent or with characteristics like uterine cramping or contractions. Patient notes some light pink discharge with wiping. She has not filled her Rx for terazol for yeast or the flagyl for BV  Review of Systems:  her 12 point review of systems is negative or as noted in the History of Present Illness.   PMHx:  Past Medical History  Diagnosis Date  . Bicornate uterus   . Genital HSV   . Obesity affecting pregnancy     BMI>40   PSHx:  Past Surgical History  Procedure Laterality Date  . Cesarean section      for HSV outbreak   Medications:  Prescriptions prior to admission  Medication Sig Dispense Refill Last Dose  . acetaminophen (TYLENOL) 500 MG tablet Take 1,000 mg by mouth every 6 (six) hours as needed for moderate pain (reports sore throat, similiar to strep upon awakening today).   08/08/2015 at 1600  . Alum & Mag Hydroxide-Simeth (MAGIC MOUTHWASH W/LIDOCAINE) SOLN Take 5 mLs by mouth 4 (four) times daily. (Patient not taking: Reported on 08/07/2015) 100 mL 0 Not Taking at Unknown time  . methylPREDNISolone (MEDROL DOSEPAK) 4 MG TBPK tablet Take Tapered dose as directed (Patient not taking: Reported on 08/07/2015) 21 tablet 0 Not Taking at Unknown time  . ondansetron (ZOFRAN) 4 MG tablet Take 1 tablet (4 mg total) by mouth daily as needed for nausea or vomiting. (Patient not taking: Reported on 08/07/2015) 20 tablet 0 Not Taking at Unknown time  . pantoprazole (PROTONIX) 40 MG tablet Take 1 tablet (40 mg total) by mouth daily. (Patient not taking: Reported on 08/07/2015) 30 tablet 1 Not Taking at Unknown time  . sucralfate (CARAFATE)  1 G tablet Take 1 tablet (1 g total) by mouth 4 (four) times daily. (Patient not taking: Reported on 08/07/2015) 60 tablet 0 Not Taking at Unknown time     Allergies: is allergic to percocet. OBHx:  OB History  Gravida Para Term Preterm AB SAB TAB Ectopic Multiple Living  # Outcome Date GA Lbr Len/2nd Weight Sex Delivery Anes PTL Lv  2 Current           1 Term 06/12/10    M CS-LTranv   Y                 FHx:  Family History  Problem Relation Age of Onset  . Diabetes Maternal Aunt   . Hyperlipidemia Maternal Aunt    Soc Hx:  Social History   Social History  . Marital Status: Married    Spouse Name: N/A  . Number of Children: N/A  . Years of Education: N/A   Occupational History  . Not on file.   Social History Main Topics  . Smoking status: Never Smoker   . Smokeless tobacco: Not on file  . Alcohol Use: No  . Drug Use: No  . Sexual Activity: Yes   Other Topics Concern  . Not on file   Social History Narrative    Objective  AF VS normal from earlier today  FHTs: normal  Toco: negative  General: Well nourished, well developed female in no acute distress.  Skin:  Warm and dry.  Respiratory:   Normal respiratory effort Abdomen: obese, nttp, ND, gravid. Slight 4cm bruise on right side of belly but no uterine tenderness Neuro/Psych:  Normal mood and affect.   SSE: slight pink d/c in vault. No active bleeding. cx visually closed SVE: cl/long/high    Assessment & Plan   25 y.o. G2P1001 @ 7057w6d with pink vaginal discharge; pt doing well S/s, exam and EFM not consistent with abruption. Likely discharge from multiple SSEs and SVEs and yeast and BV infection. Pt advised to let us know if s/s worsen but okay to have pink discharge. She has appt tomorrow. D/c to home.   Cornelia Copaharlie Kelwin Gibler, Jr. MD [facility] Pager [phone]

## 2015-08-08 NOTE — Telephone Encounter (Signed)
Telephone Note Ms. Jacqlyn LarsenSaul called me back and she still has some belly discomfort and the pink/red discharge. I told her that I'd recommend coming back into L&D for evaluation, which she is amenable to. L&D aware.  Cornelia Copaharlie Pavle Wiler, Jr MD [facility]  Pager: [phone]

## 2015-08-09 NOTE — Discharge Summary (Signed)
See FPN  Sandy [name], Jr MD [facility]  Pager: [phone]
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
## 2015-09-19 ENCOUNTER — Encounter: Payer: Self-pay | Admitting: *Deleted

## 2015-09-19 ENCOUNTER — Observation Stay
Admission: EM | Admit: 2015-09-19 | Discharge: 2015-09-19 | Disposition: A | Discharge status: Home / Self Care | Payer: Medicaid Other | Attending: Obstetrics & Gynecology | Admitting: Obstetrics & Gynecology

## 2015-09-19 DIAGNOSIS — O9989 Other specified diseases and conditions complicating pregnancy, childbirth and the puerperium: Secondary | ICD-10-CM | POA: Insufficient documentation

## 2015-09-19 DIAGNOSIS — R109 Unspecified abdominal pain: Secondary | ICD-10-CM

## 2015-09-19 DIAGNOSIS — K529 Noninfective gastroenteritis and colitis, unspecified: Secondary | ICD-10-CM | POA: Diagnosis not present

## 2015-09-19 DIAGNOSIS — O99613 Diseases of the digestive system complicating pregnancy, third trimester: Principal | ICD-10-CM | POA: Insufficient documentation

## 2015-09-19 DIAGNOSIS — Z3A30 30 weeks gestation of pregnancy: Secondary | ICD-10-CM | POA: Diagnosis not present

## 2015-09-19 DIAGNOSIS — E860 Dehydration: Secondary | ICD-10-CM | POA: Insufficient documentation

## 2015-09-19 DIAGNOSIS — O26899 Other specified pregnancy related conditions, unspecified trimester: Secondary | ICD-10-CM

## 2015-09-19 LAB — URINALYSIS COMPLETE WITH MICROSCOPIC (ARMC ONLY)
BACTERIA UA: NONE SEEN
Bilirubin Urine: NEGATIVE
GLUCOSE, UA: NEGATIVE mg/dL
HGB URINE DIPSTICK: NEGATIVE
Ketones, ur: NEGATIVE mg/dL
Nitrite: NEGATIVE
PH: 6 (ref 5.0–8.0)
Protein, ur: 30 mg/dL — AB
Specific Gravity, Urine: 1.025 (ref 1.005–1.030)

## 2015-09-19 MED ORDER — NITROFURANTOIN MONOHYD MACRO 100 MG PO CAPS: 100.0000 mg | ORAL_CAPSULE | Freq: Two times a day (BID) | ORAL | Status: DC

## 2015-09-19 MED ORDER — ACETAMINOPHEN 325 MG PO TABS: 650.0000 mg | ORAL_TABLET | ORAL | Status: DC | PRN

## 2015-09-19 MED ORDER — ONDANSETRON HCL 4 MG/2ML IJ SOLN: 4.0000 mg | Freq: Four times a day (QID) | INTRAMUSCULAR | Status: DC | PRN

## 2015-09-19 MED ORDER — NITROFURANTOIN MONOHYD MACRO 100 MG PO CAPS
100.0000 mg | ORAL_CAPSULE | Freq: Two times a day (BID) | ORAL | Status: DC
  Administered 2015-09-19: 100 mg via ORAL
  Filled 2015-09-19: qty 1

## 2015-09-19 NOTE — Discharge Instructions (Signed)
Finish antibiotic as prescribed.  Call MD office with any concerns.

## 2015-09-19 NOTE — Discharge Summary (Signed)
  See FPN 

## 2015-09-19 NOTE — OB Triage Note (Signed)
Complaints of abdominal cramps at 0400 this morning. Woke patient from sleep. Slight leaking of fluid when she woke up, but thinks that it may be urine. Diarrhea started around 1230 with 3 BM since, but feeling ok now. 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

## 2015-09-19 NOTE — Final Progress Note (Addendum)
Physician Final Progress Note  Patient ID: Sandy LeaderJasmine M Murley MRN: [id number] DOB/AGE: Aug 14, 1990 25 y.o.  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  Today noted lower abd pain, followed by nausea and 3 episodes of diarrhea.  Min po today.  Good FM.  No VB or VD.    Exam- AF, VSS       Chest clear       Heart reg       Abd gravid, Vtx, NT, ND, NABS       FHT 140s       Extr no edema       SVE CL/TH/HI cervix       SSE no pool or nitrazine Findings c/w dehydration likely related to a mild gastroenteritis  Procedures: A NST procedure was performed with FHR monitoring and a normal baseline established, appropriate time of 20-40 minutes of evaluation, and accels >2 seen w 15x15 characteristics.  Results show a REACTIVE NST.   Discharge Condition: good  Disposition: 01-Home or Self Care  Diet: Regular diet  Discharge Activity: Activity as tolerated     Medication List    ASK your doctor about these medications        multivitamin-prenatal 27-0.8 MG Tabs tablet  Take 1 tablet by mouth daily at 12 noon.      Also, Macrobid twice daily for 5 days for antibiotic     Follow-up Information    Follow up with Letitia Libraobert Paul Aleicia Kenagy, MD. Schedule an appointment as soon as possible for a visit in 1 week.   Specialty:  Obstetrics and Gynecology   [facility]  [facility]   Contact information:   [address] [place] [address] [phone]       Total time spent taking care of this patient: 15 minutes  Signed: Letitia Libraobert Paul Anelisse Jacobson 09/19/2015, 12:42 PM

## 2015-09-27 ENCOUNTER — Observation Stay
Admission: EM | Admit: 2015-09-27 | Discharge: 2015-09-27 | Disposition: A | Discharge status: Home / Self Care | Payer: Medicaid Other | Attending: Obstetrics & Gynecology | Admitting: Obstetrics & Gynecology

## 2015-09-27 ENCOUNTER — Encounter: Payer: Self-pay | Admitting: *Deleted

## 2015-09-27 DIAGNOSIS — E860 Dehydration: Secondary | ICD-10-CM | POA: Diagnosis not present

## 2015-09-27 DIAGNOSIS — R109 Unspecified abdominal pain: Secondary | ICD-10-CM

## 2015-09-27 DIAGNOSIS — M549 Dorsalgia, unspecified: Secondary | ICD-10-CM | POA: Diagnosis present

## 2015-09-27 DIAGNOSIS — O2343 Unspecified infection of urinary tract in pregnancy, third trimester: Principal | ICD-10-CM | POA: Insufficient documentation

## 2015-09-27 DIAGNOSIS — O9989 Other specified diseases and conditions complicating pregnancy, childbirth and the puerperium: Secondary | ICD-10-CM | POA: Insufficient documentation

## 2015-09-27 DIAGNOSIS — O26899 Other specified pregnancy related conditions, unspecified trimester: Secondary | ICD-10-CM

## 2015-09-27 DIAGNOSIS — Z3A33 33 weeks gestation of pregnancy: Secondary | ICD-10-CM | POA: Insufficient documentation

## 2015-09-27 DIAGNOSIS — N390 Urinary tract infection, site not specified: Secondary | ICD-10-CM | POA: Diagnosis present

## 2015-09-27 LAB — URINALYSIS COMPLETE WITH MICROSCOPIC (ARMC ONLY)
Bilirubin Urine: NEGATIVE
Glucose, UA: NEGATIVE mg/dL
HGB URINE DIPSTICK: NEGATIVE
NITRITE: NEGATIVE
PROTEIN: 30 mg/dL — AB
SPECIFIC GRAVITY, URINE: 1.023 (ref 1.005–1.030)
pH: 6 (ref 5.0–8.0)

## 2015-09-27 LAB — FETAL FIBRONECTIN: Fetal Fibronectin: NEGATIVE

## 2015-09-27 MED ORDER — CEPHALEXIN 500 MG PO CAPS: 500.0000 mg | ORAL_CAPSULE | Freq: Four times a day (QID) | ORAL | Status: DC

## 2015-09-27 MED ORDER — ACETAMINOPHEN 325 MG PO TABS: 650.0000 mg | ORAL_TABLET | ORAL | Status: DC | PRN

## 2015-09-27 MED ORDER — ONDANSETRON HCL 4 MG/2ML IJ SOLN: 4.0000 mg | Freq: Four times a day (QID) | INTRAMUSCULAR | Status: DC | PRN

## 2015-09-27 NOTE — OB Triage Note (Signed)
Presents with complaint of constant pressure in vaginal area and some pain on R side that radiates into back. Pt recently finished antibiotic for UTI. Denies and burning with urination. Also denies any vaginal bleeding or cramping.

## 2015-09-27 NOTE — Final Progress Note (Signed)
Physician Final Progress Note  Patient ID: Sandy [name] MRN: [id number] DOB/AGE: 09/12/1990 25 y.o.  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  Today noted lower abd pain. Good FM.  No VB or VD.    Exam- AF, VSS       Chest clear       Heart reg       Abd gravid, Vtx, NT, ND, NABS       FHT 140s       Extr no edema       SVE CL/TH/HI cervix       SSE no pool or nitrazine Findings c/w dehydration  Also, UA c/w UTI (recent Macrobid yet no change in sx's or UA results)  Procedures: A NST procedure was performed with FHR monitoring and a normal baseline established, appropriate time of 20-40 minutes of evaluation, and accels >2 seen w 15x15 characteristics.  Results show a REACTIVE NST.   Discharge Condition: good  Disposition: 01-Home or Self Care  Diet: Regular diet  Discharge Activity: Activity as tolerated     Medication List    ASK your doctor about these medications        multivitamin-prenatal 27-0.8 MG Tabs tablet  Take 1 tablet by mouth daily at 12 noon.    CHANGE TO KEFLEX 500 mg po 4 times daily for UTI.  Total time spent taking care of this patient: 15 minutes  Signed: Letitia Libraobert Paul Griffin Dewilde 09/27/2015, 2:16 PM

## 2015-10-16 ENCOUNTER — Observation Stay
Admission: EM | Admit: 2015-10-16 | Discharge: 2015-10-16 | Disposition: A | Discharge status: Home / Self Care | Payer: Medicaid Other | Attending: Obstetrics & Gynecology | Admitting: Obstetrics & Gynecology

## 2015-10-16 ENCOUNTER — Encounter: Payer: Self-pay | Admitting: Emergency Medicine

## 2015-10-16 DIAGNOSIS — Q513 Bicornate uterus: Secondary | ICD-10-CM | POA: Insufficient documentation

## 2015-10-16 DIAGNOSIS — Z6839 Body mass index (BMI) 39.0-39.9, adult: Secondary | ICD-10-CM | POA: Diagnosis not present

## 2015-10-16 DIAGNOSIS — R42 Dizziness and giddiness: Secondary | ICD-10-CM

## 2015-10-16 DIAGNOSIS — R197 Diarrhea, unspecified: Secondary | ICD-10-CM | POA: Diagnosis not present

## 2015-10-16 DIAGNOSIS — R55 Syncope and collapse: Secondary | ICD-10-CM | POA: Insufficient documentation

## 2015-10-16 DIAGNOSIS — Z3A35 35 weeks gestation of pregnancy: Secondary | ICD-10-CM | POA: Diagnosis not present

## 2015-10-16 DIAGNOSIS — O99213 Obesity complicating pregnancy, third trimester: Secondary | ICD-10-CM | POA: Insufficient documentation

## 2015-10-16 DIAGNOSIS — E669 Obesity, unspecified: Secondary | ICD-10-CM | POA: Diagnosis not present

## 2015-10-16 DIAGNOSIS — A6000 Herpesviral infection of urogenital system, unspecified: Secondary | ICD-10-CM | POA: Insufficient documentation

## 2015-10-16 DIAGNOSIS — O98313 Other infections with a predominantly sexual mode of transmission complicating pregnancy, third trimester: Secondary | ICD-10-CM | POA: Insufficient documentation

## 2015-10-16 DIAGNOSIS — O3403 Maternal care for unspecified congenital malformation of uterus, third trimester: Secondary | ICD-10-CM | POA: Insufficient documentation

## 2015-10-16 DIAGNOSIS — Z885 Allergy status to narcotic agent status: Secondary | ICD-10-CM | POA: Insufficient documentation

## 2015-10-16 DIAGNOSIS — O9989 Other specified diseases and conditions complicating pregnancy, childbirth and the puerperium: Secondary | ICD-10-CM | POA: Diagnosis not present

## 2015-10-16 LAB — COMPREHENSIVE METABOLIC PANEL
ALBUMIN: 2.9 g/dL — AB (ref 3.5–5.0)
ALT: 15 U/L (ref 14–54)
ANION GAP: 6 (ref 5–15)
AST: 15 U/L (ref 15–41)
Alkaline Phosphatase: 86 U/L (ref 38–126)
BUN: 7 mg/dL (ref 6–20)
CHLORIDE: 108 mmol/L (ref 101–111)
CO2: 22 mmol/L (ref 22–32)
Calcium: 8.6 mg/dL — ABNORMAL LOW (ref 8.9–10.3)
Creatinine, Ser: 0.46 mg/dL (ref 0.44–1.00)
GFR calc Af Amer: >60 mL/min (ref >60)
GFR calc non Af Amer: >60 mL/min (ref >60)
GLUCOSE: 78 mg/dL (ref 65–99)
POTASSIUM: 3.6 mmol/L (ref 3.5–5.1)
SODIUM: 136 mmol/L (ref 135–145)
TOTAL PROTEIN: 6 g/dL — AB (ref 6.5–8.1)
Total Bilirubin: 0.7 mg/dL (ref 0.3–1.2)

## 2015-10-16 LAB — URINALYSIS COMPLETE WITH MICROSCOPIC (ARMC ONLY)
Bilirubin Urine: NEGATIVE
Glucose, UA: NEGATIVE mg/dL
Hgb urine dipstick: NEGATIVE
KETONES UR: NEGATIVE mg/dL
NITRITE: NEGATIVE
PH: 6 (ref 5.0–8.0)
PROTEIN: NEGATIVE mg/dL
SPECIFIC GRAVITY, URINE: 1.017 (ref 1.005–1.030)

## 2015-10-16 LAB — URINE DRUG SCREEN, QUALITATIVE (ARMC ONLY)
Amphetamines, Ur Screen: NOT DETECTED
BARBITURATES, UR SCREEN: NOT DETECTED
BENZODIAZEPINE, UR SCRN: NOT DETECTED
CANNABINOID 50 NG, UR ~~LOC~~: NOT DETECTED
COCAINE METABOLITE, UR ~~LOC~~: NOT DETECTED
MDMA (Ecstasy)Ur Screen: NOT DETECTED
METHADONE SCREEN, URINE: NOT DETECTED
OPIATE, UR SCREEN: NOT DETECTED
Phencyclidine (PCP) Ur S: NOT DETECTED
Tricyclic, Ur Screen: NOT DETECTED

## 2015-10-16 LAB — CBC
HEMATOCRIT: 34.2 % — AB (ref 35.0–47.0)
HEMOGLOBIN: 12.1 g/dL (ref 12.0–16.0)
MCH: 30.2 pg (ref 26.0–34.0)
MCHC: 35.4 g/dL (ref 32.0–36.0)
MCV: 85.3 fL (ref 80.0–100.0)
Platelets: 158 10*3/uL (ref 150–440)
RBC: 4.01 MIL/uL (ref 3.80–5.20)
RDW: 15 % — AB (ref 11.5–14.5)
WBC: 7.5 10*3/uL (ref 3.6–11.0)

## 2015-10-16 LAB — TYPE AND SCREEN
ABO/RH(D): A POS
ANTIBODY SCREEN: NEGATIVE

## 2015-10-16 LAB — TSH: TSH: 4.106 u[IU]/mL (ref 0.350–4.500)

## 2015-10-16 LAB — MAGNESIUM: MAGNESIUM: 1.9 mg/dL (ref 1.7–2.4)

## 2015-10-16 MED ORDER — LACTATED RINGERS IV SOLN
Freq: Once | INTRAVENOUS | Status: AC
  Administered 2015-10-16: 1000 mL/h via INTRAVENOUS

## 2015-10-16 NOTE — ED Notes (Signed)
Pt going to LD floor- OBS 1 Dr. Elesa MassedWard.

## 2015-10-16 NOTE — ED Notes (Signed)
Patient was using restroom with her 24 year old son when she sat down to use the restroom. Patient recalls "waking up" to her son saying "mommy, you cant sleep in here". Patient can not definitively state that she lost consciousness.  Patient states that she has not been feeling well and has had recent episodes of dizziness and weakness. Pt contacted OBGYN and was instructed to report to ER

## 2015-10-16 NOTE — Progress Notes (Addendum)
Pt. resting with IV bolus going at 999 for one hour, bolus almost complete. Pt, to receive one liter of LR.

## 2015-10-16 NOTE — Progress Notes (Signed)
Rn to the bedside to review discharge instructions with patient.  Pt. Will follow up on Wednesday at regular scheduled appt. (ultrasound appt.). Copy of discharge instructions signed and given to patient.

## 2015-10-16 NOTE — ED Provider Notes (Signed)
[facility] Emergency Department Provider Note  ____________________________________________  Time seen: 6:45 PM  I have reviewed the triage vital signs and the nursing notes.   HISTORY  Chief Complaint Loss of Consciousness    HPI Sandy [name] is a 25 y.o. female who is [redacted] weeks pregnant and reports having a episode of dizziness while at the [employer] today with her 24-year-old son. She does report that she's had diarrhea for the past 2 days, and yesterday did not take a lot of fluids. May be dehydrated. After walking around the [place] aquarium, she suddenly felt very lightheaded and hot and flushed. She went to the bathroom female would help, but while she was on the toilet she leaned to the side and close her eyes. She doesn't think she passed out. Her son told her to wake up and had said that she had fallen asleep. She did not fall off the toilet, no head injury or other injuries. She did not hit her abdomen. Report that yesterday it appeared that she passed her cervical mucous plug. No contractions vaginal bleeding or leakage of fluid since then. Normal fetal movements.  Patient reports that she has spoken with her obstetrician who told her to come to labor and delivery for monitoring and fetal evaluation. However, they did want her to be cleared by the ER first.     Past Medical History  Diagnosis Date  . Bicornate uterus   . Genital HSV   . Obesity affecting pregnancy     BMI>40     Patient Active Problem List   Diagnosis Date Noted  . Back pain 09/27/2015  . UTI (lower urinary tract infection) 09/27/2015  . Abdominal pain affecting pregnancy 09/19/2015  . Vaginal discharge 08/08/2015  . Vaginal bleeding in pregnancy 08/08/2015  . Traumatic injury during pregnancy in second trimester 08/07/2015     Past Surgical History  Procedure Laterality Date  . Cesarean section      for HSV outbreak  . Tonsillectomy    . Ears Bilateral    tubes     Current Outpatient Rx  Name  Route  Sig  Dispense  Refill  . cephALEXin (KEFLEX) 500 MG capsule   Oral   Take 1 capsule (500 mg total) by mouth 4 (four) times daily.   28 capsule   0   . Prenatal Vit-Fe Fumarate-FA (MULTIVITAMIN-PRENATAL) 27-0.8 MG TABS tablet   Oral   Take 1 tablet by mouth daily at 12 noon.            Allergies Percocet   Family History  Problem Relation Age of Onset  . Diabetes Maternal Aunt   . Hyperlipidemia Maternal Aunt     Social History Social History  Substance Use Topics  . Smoking status: Never Smoker   . Smokeless tobacco: None  . Alcohol Use: No    Review of Systems  Constitutional:   No fever or chills.  ENT:   No sore throat. No rhinorrhea. Cardiovascular:   No chest pain. Respiratory:   No dyspnea or cough. Gastrointestinal:   Negative for abdominal pain, vomiting And positive diarrhea 2 days  Genitourinary:   Negative for dysuria or difficulty urinating. Musculoskeletal:   Negative for focal pain or swelling Neurological:   Negative for headaches 10-point ROS otherwise negative.  ____________________________________________   PHYSICAL EXAM:  VITAL SIGNS: ED Triage Vitals  Enc Vitals Group     BP 10/16/15 1733 126/59 mmHg     Pulse --  Resp 10/16/15 1733 18     Temp 10/16/15 1733 98.2 F (36.8 C)     Temp Source 10/16/15 1733 Oral     SpO2 10/16/15 1733 98 %     Weight 10/16/15 1733 236 lb (107.049 kg)     Height 10/16/15 1733  (1.651 m)     Head Cir --      Peak Flow --      Pain Score 10/16/15 1823 0     Pain Loc --      Pain Edu? --      Excl. in GC? --     Vital signs reviewed, nursing assessments reviewed.   Constitutional:   Alert and oriented. Well appearing and in no distress. Eyes:   No scleral icterus. No conjunctival pallor. PERRL. EOMI.  No nystagmus. ENT   Head:   Normocephalic and atraumatic.   Nose:   No congestion/rhinnorhea. No septal hematoma    Mouth/Throat:   MMM, no pharyngeal erythema. No peritonsillar mass.    Neck:   No stridor. No SubQ emphysema. No meningismus. Hematological/Lymphatic/Immunilogical:   No cervical lymphadenopathy. Cardiovascular:   RRR. Symmetric bilateral radial and DP pulses.  No murmurs.  Respiratory:   Normal respiratory effort without tachypnea nor retractions. Breath sounds are clear and equal bilaterally. No wheezes/rales/rhonchi. Gastrointestinal:   Soft And gravid size consistent with dates, nontender. No rigidity or guarding. Genitourinary:   deferred Musculoskeletal:   Nontender with normal range of motion in all extremities. No joint effusions.  No lower extremity tenderness.  No edema. Neurologic:   Normal speech and language.  CN 2-10 normal. Motor grossly intact. No gross focal neurologic deficits are appreciated.  Skin:    Skin is warm, dry and intact. No rash noted.  No petechiae, purpura, or bullae.  ____________________________________________    LABS (pertinent positives/negatives) (all labs ordered are listed, but only abnormal results are displayed) Labs Reviewed - No data to display ____________________________________________   EKG  Interpreted by me  Date: 10/16/2015  Rate: 76  Rhythm: normal sinus rhythm  QRS Axis: normal  Intervals: normal  ST/T Wave abnormalities: normal  Conduction Disutrbances: none  Narrative Interpretation: unremarkable      ____________________________________________    RADIOLOGY    ____________________________________________   PROCEDURES   ____________________________________________   INITIAL IMPRESSION / ASSESSMENT AND PLAN / ED COURSE  Pertinent labs & imaging results that were available during my care of the patient were reviewed by me and considered in my medical decision making (see chart for details).  Patient presents with presyncope. Well appearing no acute distress. EKG unremarkable. Likely related to  decreased oral intake and mild dehydration in the setting of the chemistry pregnancy. Per patient Dr. Elesa Massed from obstetrics is expecting her to come to labor and delivery after his being cleared from emergency Department for fetal NST. Stable for discharge.   ____________________________________________   FINAL CLINICAL IMPRESSION(S) / ED DIAGNOSES  Final diagnoses:  Dizziness       Portions of this note were generated with dragon dictation software. Dictation errors may occur despite best attempts at proofreading.   Sharman Cheek, MD 10/16/15 [phone]

## 2015-10-16 NOTE — ED Notes (Signed)
Pt is [redacted] weeks pregnant.  Had possible syncopal episode while in the [facility] at [facility].  Pt c/o feeling dizzy.  EDD  11/22/15.  Pt states she called Dr. Elesa MassedWard who is on call and was told to come in and have a stress test on the baby.

## 2015-10-16 NOTE — OB Triage Note (Signed)
Pt. here in BP with c/o of loss of consciousness, 1st episode yesterday around 6:00 p.m. or 7:00 p.m. and today around 1:00 p.m. or 2:00 p.m. while at [facility] ; pt. States she's feeling a little "off". Positive for fetal movement, feels little she loss her mucus plug on Thursday. Denies vaginal bleeding and denies gush of flush.

## 2015-10-16 NOTE — Discharge Summary (Signed)
Sandy [name] is a 25 y.o. female. She is at [redacted]w[redacted]d gestation.  Chief Complaint: syncope, general unwell feeling  S: Resting comfortably. no CTX, no VB.no LOF,  Active fetal movement.   Yesterday patient says she was shopping in an air-conditioned store and suddenly became hot and flushed/clammy and felt lightheaded/dizzy with tracers in her vision.  She sat down and was able to recover.  She felt off the rest of the day, and then today went to an aquarium with her son, went to the bathroom while suddenly feeling the same as the day before, and then next thing she knew her 5yo son was telling her she wasn't supposed to sleep in the bathroom.  She did not lose her bladder function, wasn't shaking per her son, and did not hit her head or fall.  She recovered, drove herself home, and although didn't feel 100%, still felt better.  She called the triage line and was advised to come in for evaluation. She was first seen by the ED, and and EKG was performed that was WNL.   Patient endorses recent diarrhea over the last few days.  No nausea or vomiting.  Denies LOF, VB, CTX, +FM   Maternal Medical History:   Past Medical History  Diagnosis Date  . Bicornate uterus   . Genital HSV   . Obesity affecting pregnancy     BMI>40    Past Surgical History  Procedure Laterality Date  . Cesarean section      for HSV outbreak  . Tonsillectomy    . Ears Bilateral     tubes    Allergies  Allergen Reactions  . Percocet [Oxycodone-Acetaminophen] Itching    Prior to Admission medications   Medication Sig Start Date End Date Taking? Authorizing Provider  Prenatal Vit-Fe Fumarate-FA (MULTIVITAMIN-PRENATAL) 27-0.8 MG TABS tablet Take 1 tablet by mouth daily at 12 noon.   Yes Historical Provider, MD  cephALEXin (KEFLEX) 500 MG capsule Take 1 capsule (500 mg total) by mouth 4 (four) times daily. Patient not taking: Reported on 10/16/2015 09/27/15   Nadara Mustard, MD     Prenatal care site: [facility]   Social History: She  reports that she has never smoked. She does not have any smokeless tobacco history on file. She reports that she does not drink alcohol or use illicit drugs.  Family History: family history includes Diabetes in her maternal aunt; Hyperlipidemia in her maternal aunt.   Review of Systems: A full review of systems was performed and negative except as noted in the HPI.     O:  BP 120/68 mmHg  Pulse 74  Temp(Src) 98.1 F (36.7 C) (Oral)  Resp 18  Ht 5\' 5"  (1.651 m)  Wt 107.049 kg (236 lb)  BMI 39.27 kg/m2  SpO2 98%  LMP  (LMP Unknown) Results for orders placed or performed during the hospital encounter of 10/16/15 (from the past 48 hour(s))  Type and screen [facility]   Collection Time: 10/16/15  8:34 PM  Result Value Ref Range   ABO/RH(D) A POS    Antibody Screen NEG    Sample Expiration 10/19/2015   Comprehensive metabolic panel   Collection Time: 10/16/15  8:35 PM  Result Value Ref Range   Sodium 136 135 - 145 mmol/L   Potassium 3.6 3.5 - 5.1 mmol/L   Chloride 108 101 - 111 mmol/L   CO2 22 22 - 32 mmol/L   Glucose, Bld 78 65 - 99 mg/dL  BUN 7 6 - 20 mg/dL   Creatinine, Ser 1.61 0.44 - 1.00 mg/dL   Calcium 8.6 (L) 8.9 - 10.3 mg/dL   Total Protein 6.0 (L) 6.5 - 8.1 g/dL   Albumin 2.9 (L) 3.5 - 5.0 g/dL   AST 15 15 - 41 U/L   ALT 15 14 - 54 U/L   Alkaline Phosphatase 86 38 - 126 U/L   Total Bilirubin 0.7 0.3 - 1.2 mg/dL   GFR calc non Af Amer >60 >60 mL/min   GFR calc Af Amer >60 >60 mL/min   Anion gap 6 5 - 15  CBC   Collection Time: 10/16/15  8:35 PM  Result Value Ref Range   WBC 7.5 3.6 - 11.0 K/uL   RBC 4.01 3.80 - 5.20 MIL/uL   Hemoglobin 12.1 12.0 - 16.0 g/dL   HCT 09.6 (L) 04.5 - 40.9 %   MCV 85.3 80.0 - 100.0 fL   MCH 30.2 26.0 - 34.0 pg   MCHC 35.4 32.0 - 36.0 g/dL   RDW 81.1 (H) 91.4 - 78.2 %   Platelets 158 150 - 440 K/uL  Urinalysis complete, with microscopic ([facility] only)   Collection Time: 10/16/15   8:35 PM  Result Value Ref Range   Color, Urine YELLOW (A) YELLOW   APPearance CLEAR (A) CLEAR   Glucose, UA NEGATIVE NEGATIVE mg/dL   Bilirubin Urine NEGATIVE NEGATIVE   Ketones, ur NEGATIVE NEGATIVE mg/dL   Specific Gravity, Urine 1.017 1.005 - 1.030   Hgb urine dipstick NEGATIVE NEGATIVE   pH 6.0 5.0 - 8.0   Protein, ur NEGATIVE NEGATIVE mg/dL   Nitrite NEGATIVE NEGATIVE   Leukocytes, UA TRACE (A) NEGATIVE   RBC / HPF 0-5 0 - 5 RBC/hpf   WBC, UA 0-5 0 - 5 WBC/hpf   Bacteria, UA RARE (A) NONE SEEN   Squamous Epithelial / LPF 0-5 (A) NONE SEEN   Mucous PRESENT    Ca Oxalate Crys, UA PRESENT   Magnesium   Collection Time: 10/16/15  8:35 PM  Result Value Ref Range   Magnesium 1.9 1.7 - 2.4 mg/dL  TSH   Collection Time: 10/16/15  8:35 PM  Result Value Ref Range   TSH 4.106 0.350 - 4.500 uIU/mL  Urine Drug Screen, Qualitative ([facility] only)   Collection Time: 10/16/15  8:35 PM  Result Value Ref Range   Tricyclic, Ur Screen NONE DETECTED NONE DETECTED   Amphetamines, Ur Screen NONE DETECTED NONE DETECTED   MDMA (Ecstasy)Ur Screen NONE DETECTED NONE DETECTED   Cocaine Metabolite,Ur [place] NONE DETECTED NONE DETECTED   Opiate, Ur Screen NONE DETECTED NONE DETECTED   Phencyclidine (PCP) Ur S NONE DETECTED NONE DETECTED   Cannabinoid 50 Ng, Ur [place] NONE DETECTED NONE DETECTED   Barbiturates, Ur Screen NONE DETECTED NONE DETECTED   Benzodiazepine, Ur Scrn NONE DETECTED NONE DETECTED   Methadone Scn, Ur NONE DETECTED NONE DETECTED     Constitutional: NAD, AAOx3  HE/ENT: extraocular movements grossly intact, moist mucous membranes CV: RRR PULM: nl respiratory effort, CTABL     Abd: gravid, non-tender, non-distended, soft      Ext: Non-tender, Nonedmeatous   Psych: mood appropriate, speech normal Pelvic:  FHT: 140 mod + accels no decels TOCO: quiet    A/P:  25yo G2P1001 @ 34.5 with syncopal or near-syncopal episode.  Labor: not present.   Fetal Wellbeing: Reassuring Cat 1  tracing.  Syncope: unrelated to drug use, likely due to dehydration.  Patient feels better after fluid bolus.  Suggested she notify someone if she again has prodromal symptoms. Gatorade for hydration/electrolytes/sugar throughout the day.  Increase fluid intake altogether.    D/c home stable, precautions reviewed, follow-up as scheduled.  Has appointment Wednesday.  ----- Ranae Plumberhelsea Dani Danis, MD Attending Obstetrician and Gynecologist [facility]

## 2015-10-16 NOTE — Discharge Instructions (Signed)
Go directly to labor and delivery upon leaving the emergency department for further evaluation.  Dizziness Dizziness is a common problem. It is a feeling of unsteadiness or light-headedness. You may feel like you are about to faint. Dizziness can lead to injury if you stumble or fall. Anyone can become dizzy, but dizziness is more common in older adults. This condition can be caused by a number of things, including medicines, dehydration, or illness. HOME CARE INSTRUCTIONS Taking these steps may help with your condition: Eating and Drinking  Drink enough fluid to keep your urine clear or pale yellow. This helps to keep you from becoming dehydrated. Try to drink more clear fluids, such as water.  Do not drink alcohol.  Limit your caffeine intake if directed by your health care provider.  Limit your salt intake if directed by your health care provider. Activity  Avoid making quick movements.  Rise slowly from chairs and steady yourself until you feel okay.  In the morning, first sit up on the side of the bed. When you feel okay, stand slowly while you hold onto something until you know that your balance is fine.  Move your legs often if you need to stand in one place for a long time. Tighten and relax your muscles in your legs while you are standing.  Do not drive or operate heavy machinery if you feel dizzy.  Avoid bending down if you feel dizzy. Place items in your home so that they are easy for you to reach without leaning over. Lifestyle  Do not use any tobacco products, including cigarettes, chewing tobacco, or electronic cigarettes. If you need help quitting, ask your health care provider.  Try to reduce your stress level, such as with yoga or meditation. Talk with your health care provider if you need help. General Instructions  Watch your dizziness for any changes.  Take medicines only as directed by your health care provider. Talk with your health care provider if you  think that your dizziness is caused by a medicine that you are taking.  Tell a friend or a family member that you are feeling dizzy. If he or she notices any changes in your behavior, have this person call your health care provider.  Keep all follow-up visits as directed by your health care provider. This is important. SEEK MEDICAL CARE IF:  Your dizziness does not go away.  Your dizziness or light-headedness gets worse.  You feel nauseous.  You have reduced hearing.  You have new symptoms.  You are unsteady on your feet or you feel like the room is spinning. SEEK IMMEDIATE MEDICAL CARE IF:  You vomit or have diarrhea and are unable to eat or drink anything.  You have problems talking, walking, swallowing, or using your arms, hands, or legs.  You feel generally weak.  You are not thinking clearly or you have trouble forming sentences. It may take a friend or family member to notice this.  You have chest pain, abdominal pain, shortness of breath, or sweating.  Your vision changes.  You notice any bleeding.  You have a headache.  You have neck pain or a stiff neck.  You have a fever.   This information is not intended to replace advice given to you by your health care provider. Make sure you discuss any questions you have with your health care provider.   Document Released: 09/19/2000 Document Revised: 08/10/2014 Document Reviewed: 03/22/2014 Elsevier Interactive Patient Education 2016 [employer]  Near-Syncope Near-syncope (commonly  known as near fainting) is sudden weakness, dizziness, or feeling like you might pass out. During an episode of near-syncope, you may also develop pale skin, have tunnel vision, or feel sick to your stomach (nauseous). Near-syncope may occur when getting up after sitting or while standing for a long time. It is caused by a sudden decrease in blood flow to the brain. This decrease can result from various causes or triggers, most of which are  not serious. However, because near-syncope can sometimes be a sign of something serious, a medical evaluation is required. The specific cause is often not determined. HOME CARE INSTRUCTIONS  Monitor your condition for any changes. The following actions may help to alleviate any discomfort you are experiencing:  Have someone stay with you until you feel stable.  Lie down right away and prop your feet up if you start feeling like you might faint. Breathe deeply and steadily. Wait until all the symptoms have passed. Most of these episodes last only a few minutes. You may feel tired for several hours.   Drink enough fluids to keep your urine clear or pale yellow.   If you are taking blood pressure or heart medicine, get up slowly when seated or lying down. Take several minutes to sit and then stand. This can reduce dizziness.  Follow up with your health care provider as directed. SEEK IMMEDIATE MEDICAL CARE IF:   You have a severe headache.   You have unusual pain in the chest, abdomen, or back.   You are bleeding from the mouth or rectum, or you have black or tarry stool.   You have an irregular or very fast heartbeat.   You have repeated fainting or have seizure-like jerking during an episode.   You faint when sitting or lying down.   You have confusion.   You have difficulty walking.   You have severe weakness.   You have vision problems.  MAKE SURE YOU:   Understand these instructions.  Will watch your condition.  Will get help right away if you are not doing well or get worse.   This information is not intended to replace advice given to you by your health care provider. Make sure you discuss any questions you have with your health care provider.   Document Released: 03/26/2005 Document Revised: 03/31/2013 Document Reviewed: 08/29/2012 Elsevier Interactive Patient Education [employer]

## 2015-10-16 NOTE — ED Notes (Signed)
Pt states she constantly feels pressure in her lower abdomen from the baby. Pt states she lost her mucus plug yesterday and started leaking breastmilk.

## 2015-11-11 ENCOUNTER — Observation Stay
Admission: EM | Admit: 2015-11-11 | Discharge: 2015-11-11 | Disposition: A | Discharge status: Home / Self Care | Payer: Medicaid Other | Attending: Obstetrics and Gynecology | Admitting: Obstetrics and Gynecology

## 2015-11-11 ENCOUNTER — Encounter: Payer: Self-pay | Admitting: *Deleted

## 2015-11-11 DIAGNOSIS — R109 Unspecified abdominal pain: Secondary | ICD-10-CM

## 2015-11-11 DIAGNOSIS — R1030 Lower abdominal pain, unspecified: Secondary | ICD-10-CM | POA: Diagnosis not present

## 2015-11-11 DIAGNOSIS — Z3A39 39 weeks gestation of pregnancy: Secondary | ICD-10-CM | POA: Insufficient documentation

## 2015-11-11 DIAGNOSIS — O9989 Other specified diseases and conditions complicating pregnancy, childbirth and the puerperium: Principal | ICD-10-CM | POA: Insufficient documentation

## 2015-11-11 DIAGNOSIS — O26899 Other specified pregnancy related conditions, unspecified trimester: Secondary | ICD-10-CM

## 2015-11-11 NOTE — Discharge Summary (Signed)
Pt presented for c/o low abdominal pressure. No c/o ctx, LOF, VB or decreased FM.  NST reactive with accelerations and no decelerations No ctx per toco. Cervix evaluated by RN, 1 cm.  Pt discharged home with labor precautions, to f/u at office as scheduled

## 2015-11-11 NOTE — OB Triage Note (Signed)
Pt recvd by ED. Feeling baby move ok. Pt C/o one episode of abdominal discomfort several hours ago and her boyfriend wanted her to get checked out. Pt states she is fine and in no pain. No vaginal bleeding, discharge and does not feel any contractions.

## 2015-11-11 NOTE — Discharge Instructions (Signed)
Come back if:  Big gush of fluids Temp over 100.4 Heavy vaginal bleeding Decreased fetal movement Contractions every 3-5 min lasting at least one hour  Get plenty of rest and drink plenty of fluids!

## 2015-11-28 ENCOUNTER — Encounter
Admission: RE | Admit: 2015-11-28 | Discharge: 2015-11-28 | Disposition: A | Discharge status: Home / Self Care | Payer: Medicaid Other | Source: Ambulatory Visit | Attending: Obstetrics and Gynecology | Admitting: Obstetrics and Gynecology

## 2015-11-28 HISTORY — DX: Family history of other specified conditions: Z84.89

## 2015-11-28 LAB — CBC
HCT: 35.7 % (ref 35.0–47.0)
HEMOGLOBIN: 12.5 g/dL (ref 12.0–16.0)
MCH: 28.9 pg (ref 26.0–34.0)
MCHC: 35.1 g/dL (ref 32.0–36.0)
MCV: 82.4 fL (ref 80.0–100.0)
Platelets: 167 10*3/uL (ref 150–440)
RBC: 4.33 MIL/uL (ref 3.80–5.20)
RDW: 15.7 % — AB (ref 11.5–14.5)
WBC: 7.2 10*3/uL (ref 3.6–11.0)

## 2015-11-28 LAB — DIFFERENTIAL
BASOS ABS: 0 10*3/uL (ref 0–0.1)
BASOS PCT: 1 %
EOS ABS: 0.1 10*3/uL (ref 0–0.7)
Eosinophils Relative: 1 %
Lymphocytes Relative: 19 %
Lymphs Abs: 1.4 10*3/uL (ref 1.0–3.6)
Monocytes Absolute: 0.3 10*3/uL (ref 0.2–0.9)
Monocytes Relative: 4 %
NEUTROS PCT: 75 %
Neutro Abs: 5.4 10*3/uL (ref 1.4–6.5)

## 2015-11-28 LAB — TYPE AND SCREEN
ABO/RH(D): A POS
ANTIBODY SCREEN: NEGATIVE
Extend sample reason: UNDETERMINED

## 2015-11-28 MED ORDER — LACTATED RINGERS IV SOLN: INTRAVENOUS | Status: DC

## 2015-11-28 MED ORDER — LACTATED RINGERS IV SOLN
Freq: Once | INTRAVENOUS | Status: DC
Start: 2015-11-28 — End: 2015-11-29
  Filled 2015-11-28: qty 1000

## 2015-11-28 NOTE — Pre-Procedure Instructions (Signed)
Pt reports a suicide attempt at age 25 after both grandparents died and pt went to live with her aunt.

## 2015-11-28 NOTE — Patient Instructions (Addendum)
  Your procedure is scheduled ZO:XWRUEAVon:Tuesday August 22 , 2017. Report to [facility] thru Emergency at 5:00 am.   Remember: Instructions that are not followed completely may result in serious medical risk, up to and including death, or upon the discretion of your surgeon and anesthesiologist your surgery may need to be rescheduled.    _x___ 1. Do not eat food or drink liquids after midnight. No gum chewing or hard candies.     ____ 2. No Alcohol for 24 hours before or after surgery.   ____ 3. Bring all medications with you on the day of surgery if instructed.    __x__ 4. Notify your doctor if there is any change in your medical condition     (cold, fever, infections).     Do not wear jewelry, make-up, hairpins, clips or nail polish.  Do not wear lotions, powders, or perfumes. You may wear deodorant.  Do not shave 48 hours prior to surgery. Men may shave face and neck.  Do not bring valuables to the hospital.    [facility] any belongings or valuables.               Contacts, dentures or bridgework may not be worn into surgery.  Leave your suitcase in the car. After surgery it may be brought to your room.  For patients admitted to the hospital, discharge time is determined by your treatment team.   Patients discharged the day of surgery will not be allowed to drive home.    Please read over the following fact sheets that you were given:   [facility]  ____ Take these medicines the morning of surgery with A SIP OF WATER: NONE    ____ Fleet Enema (as directed)   __x__ Use SAGE wipes as directed on instruction sheet  ____ Use inhalers on the day of surgery and bring to hospital day of surgery  ____ Stop metformin 2 days prior to surgery    ____ Take 1/2 of usual insulin dose the night before surgery and none on the morning of  surgery.   ____ Stop Coumadin/Plavix/aspirin on does not apply.  ____ Stop Anti-inflammatories such as Advil,  Aleve, Ibuprofen, Motrin, Naproxen,  Naprosyn, Goodies powders or aspirin products. OK to take Tylenol.   ____ Stop supplements until after surgery.    ____ Bring C-Pap to the hospital.

## 2015-11-28 NOTE — Pre-Procedure Instructions (Signed)
Patient and chart sent with volunteer to [facility].

## 2015-11-29 ENCOUNTER — Inpatient Hospital Stay: Payer: Medicaid Other | Admitting: Anesthesiology

## 2015-11-29 ENCOUNTER — Inpatient Hospital Stay
Admission: RE | Admit: 2015-11-29 | Discharge: 2015-12-01 | DRG: 766 | Disposition: A | Discharge status: Home / Self Care | Payer: Medicaid Other | Source: Ambulatory Visit | Attending: Obstetrics and Gynecology | Admitting: Obstetrics and Gynecology

## 2015-11-29 ENCOUNTER — Encounter: Payer: Self-pay | Admitting: *Deleted

## 2015-11-29 ENCOUNTER — Encounter
Admission: RE | Disposition: A | Discharge status: Home / Self Care | Payer: Self-pay | Source: Ambulatory Visit | Attending: Obstetrics and Gynecology

## 2015-11-29 DIAGNOSIS — O480 Post-term pregnancy: Secondary | ICD-10-CM | POA: Diagnosis present

## 2015-11-29 DIAGNOSIS — O34593 Maternal care for other abnormalities of gravid uterus, third trimester: Secondary | ICD-10-CM | POA: Diagnosis present

## 2015-11-29 DIAGNOSIS — Z98891 History of uterine scar from previous surgery: Secondary | ICD-10-CM

## 2015-11-29 DIAGNOSIS — Q513 Bicornate uterus: Secondary | ICD-10-CM

## 2015-11-29 DIAGNOSIS — O99214 Obesity complicating childbirth: Secondary | ICD-10-CM | POA: Diagnosis present

## 2015-11-29 DIAGNOSIS — Z888 Allergy status to other drugs, medicaments and biological substances status: Secondary | ICD-10-CM

## 2015-11-29 DIAGNOSIS — O34211 Maternal care for low transverse scar from previous cesarean delivery: Secondary | ICD-10-CM | POA: Diagnosis present

## 2015-11-29 DIAGNOSIS — Z79899 Other long term (current) drug therapy: Secondary | ICD-10-CM | POA: Diagnosis not present

## 2015-11-29 DIAGNOSIS — Z3A41 41 weeks gestation of pregnancy: Secondary | ICD-10-CM | POA: Diagnosis not present

## 2015-11-29 LAB — RPR: RPR Ser Ql: NONREACTIVE

## 2015-11-29 LAB — CHLAMYDIA/NGC RT PCR (ARMC ONLY)
CHLAMYDIA TR: NOT DETECTED
N GONORRHOEAE: NOT DETECTED

## 2015-11-29 LAB — HIV ANTIBODY (ROUTINE TESTING W REFLEX): HIV Screen 4th Generation wRfx: NONREACTIVE

## 2015-11-29 SURGERY — Surgical Case
Anesthesia: Spinal

## 2015-11-29 MED ORDER — MORPHINE SULFATE-NACL 0.5-0.9 MG/ML-% IV SOSY
PREFILLED_SYRINGE | INTRAVENOUS | Status: DC | PRN
  Administered 2015-11-29: .1 mg via INTRATHECAL

## 2015-11-29 MED ORDER — NALBUPHINE HCL 10 MG/ML IJ SOLN
5.0000 mg | INTRAMUSCULAR | Status: DC | PRN
  Administered 2015-11-29 (×3): 5 mg via INTRAVENOUS
  Filled 2015-11-29 (×2): qty 1

## 2015-11-29 MED ORDER — NALBUPHINE HCL 10 MG/ML IJ SOLN
INTRAMUSCULAR | Status: AC
  Administered 2015-11-29: 5 mg via INTRAVENOUS
  Filled 2015-11-29: qty 1

## 2015-11-29 MED ORDER — SENNOSIDES-DOCUSATE SODIUM 8.6-50 MG PO TABS
2.0000 | ORAL_TABLET | ORAL | Status: DC
  Administered 2015-11-29 – 2015-11-30 (×2): 2 via ORAL
  Filled 2015-11-29 (×2): qty 2

## 2015-11-29 MED ORDER — PRENATAL MULTIVITAMIN CH
1.0000 | ORAL_TABLET | Freq: Every day | ORAL | Status: DC
  Administered 2015-11-30 – 2015-12-01 (×2): 1 via ORAL
  Filled 2015-11-29 (×2): qty 1

## 2015-11-29 MED ORDER — NALBUPHINE HCL 10 MG/ML IJ SOLN: 5.0000 mg | INTRAMUSCULAR | Status: DC | PRN

## 2015-11-29 MED ORDER — MENTHOL 3 MG MT LOZG
1.0000 | LOZENGE | OROMUCOSAL | Status: DC | PRN
  Filled 2015-11-29: qty 9

## 2015-11-29 MED ORDER — SIMETHICONE 80 MG PO CHEW: 80.0000 mg | CHEWABLE_TABLET | ORAL | Status: DC | PRN

## 2015-11-29 MED ORDER — DEXTROSE 5 % IV SOLN
1.0000 ug/kg/h | INTRAVENOUS | Status: DC | PRN
  Filled 2015-11-29: qty 2

## 2015-11-29 MED ORDER — SCOPOLAMINE 1 MG/3DAYS TD PT72: 1.0000 | MEDICATED_PATCH | Freq: Once | TRANSDERMAL | Status: DC

## 2015-11-29 MED ORDER — SIMETHICONE 80 MG PO CHEW
80.0000 mg | CHEWABLE_TABLET | Freq: Three times a day (TID) | ORAL | Status: DC
  Administered 2015-11-30 – 2015-12-01 (×5): 80 mg via ORAL
  Filled 2015-11-29 (×5): qty 1

## 2015-11-29 MED ORDER — OXYTOCIN BOLUS FROM INFUSION: 500.0000 mL | Freq: Once | INTRAVENOUS | Status: DC

## 2015-11-29 MED ORDER — ACETAMINOPHEN 325 MG PO TABS
650.0000 mg | ORAL_TABLET | Freq: Four times a day (QID) | ORAL | Status: AC
  Administered 2015-11-29 – 2015-11-30 (×4): 650 mg via ORAL
  Filled 2015-11-29: qty 2

## 2015-11-29 MED ORDER — BUPIVACAINE 0.25 % ON-Q PUMP DUAL CATH 400 ML: 400.0000 mL | INJECTION | Status: DC

## 2015-11-29 MED ORDER — DIPHENHYDRAMINE HCL 50 MG/ML IJ SOLN: 12.5000 mg | INTRAMUSCULAR | Status: DC | PRN

## 2015-11-29 MED ORDER — BUPIVACAINE IN DEXTROSE 0.75-8.25 % IT SOLN
INTRATHECAL | Status: DC | PRN
  Administered 2015-11-29: 1.8 mL via INTRATHECAL

## 2015-11-29 MED ORDER — LACTATED RINGERS IV SOLN
INTRAVENOUS | Status: DC | PRN
Start: 2015-11-29 — End: 2015-11-29
  Administered 2015-11-29: 08:00:00 via INTRAVENOUS

## 2015-11-29 MED ORDER — IBUPROFEN 600 MG PO TABS
600.0000 mg | ORAL_TABLET | Freq: Four times a day (QID) | ORAL | Status: DC
  Administered 2015-11-30 – 2015-12-01 (×5): 600 mg via ORAL
  Filled 2015-11-29 (×5): qty 1

## 2015-11-29 MED ORDER — KETOROLAC TROMETHAMINE 30 MG/ML IJ SOLN
30.0000 mg | Freq: Four times a day (QID) | INTRAMUSCULAR | Status: AC
  Administered 2015-11-29 – 2015-11-30 (×4): 30 mg via INTRAVENOUS
  Filled 2015-11-29 (×3): qty 1

## 2015-11-29 MED ORDER — FENTANYL CITRATE (PF) 100 MCG/2ML IJ SOLN
INTRAMUSCULAR | Status: DC | PRN
  Administered 2015-11-29: 15 ug via INTRATHECAL

## 2015-11-29 MED ORDER — BUPIVACAINE HCL 0.5 % IJ SOLN
INTRAMUSCULAR | Status: DC | PRN
  Administered 2015-11-29: 10 mL

## 2015-11-29 MED ORDER — BUPIVACAINE HCL (PF) 0.5 % IJ SOLN: 10.0000 mL | Freq: Once | INTRAMUSCULAR | Status: DC

## 2015-11-29 MED ORDER — SOD CITRATE-CITRIC ACID 500-334 MG/5ML PO SOLN: 30.0000 mL | ORAL | Status: DC

## 2015-11-29 MED ORDER — OXYTOCIN 40 UNITS IN LACTATED RINGERS INFUSION - SIMPLE MED
2.5000 [IU]/h | INTRAVENOUS | Status: DC
  Administered 2015-11-29: 1000 mL via INTRAVENOUS

## 2015-11-29 MED ORDER — NALBUPHINE HCL 10 MG/ML IJ SOLN: 5.0000 mg | Freq: Once | INTRAMUSCULAR | Status: DC | PRN

## 2015-11-29 MED ORDER — LACTATED RINGERS IV SOLN
INTRAVENOUS | Status: DC
  Administered 2015-11-29: 10:00:00 via INTRAVENOUS

## 2015-11-29 MED ORDER — OXYCODONE HCL 5 MG PO TABS: 5.0000 mg | ORAL_TABLET | ORAL | Status: DC | PRN

## 2015-11-29 MED ORDER — SOD CITRATE-CITRIC ACID 500-334 MG/5ML PO SOLN
ORAL | Status: AC
  Administered 2015-11-29: 30 mL
  Filled 2015-11-29: qty 15

## 2015-11-29 MED ORDER — OXYTOCIN 40 UNITS IN LACTATED RINGERS INFUSION - SIMPLE MED
2.5000 [IU]/h | INTRAVENOUS | Status: AC
  Administered 2015-11-29: 2.5 [IU]/h via INTRAVENOUS

## 2015-11-29 MED ORDER — ACETAMINOPHEN 325 MG PO TABS
650.0000 mg | ORAL_TABLET | ORAL | Status: DC | PRN
  Filled 2015-11-29 (×2): qty 2

## 2015-11-29 MED ORDER — LACTATED RINGERS IV SOLN
INTRAVENOUS | Status: DC | PRN
Start: 2015-11-29 — End: 2015-11-29

## 2015-11-29 MED ORDER — LACTATED RINGERS IV SOLN: 500.0000 mL | INTRAVENOUS | Status: DC | PRN

## 2015-11-29 MED ORDER — KETOROLAC TROMETHAMINE 30 MG/ML IJ SOLN
INTRAMUSCULAR | Status: AC
  Administered 2015-11-29: 30 mg via INTRAVENOUS
  Filled 2015-11-29: qty 1

## 2015-11-29 MED ORDER — MEPERIDINE HCL 25 MG/ML IJ SOLN: 6.2500 mg | INTRAMUSCULAR | Status: DC | PRN

## 2015-11-29 MED ORDER — CHLORHEXIDINE GLUCONATE CLOTH 2 % EX PADS: 6.0000 | MEDICATED_PAD | Freq: Every day | CUTANEOUS | Status: DC

## 2015-11-29 MED ORDER — LACTATED RINGERS IV SOLN: INTRAVENOUS | Status: DC

## 2015-11-29 MED ORDER — NALOXONE HCL 0.4 MG/ML IJ SOLN: 0.4000 mg | INTRAMUSCULAR | Status: DC | PRN

## 2015-11-29 MED ORDER — KETOROLAC TROMETHAMINE 30 MG/ML IJ SOLN: 30.0000 mg | Freq: Four times a day (QID) | INTRAMUSCULAR | Status: AC

## 2015-11-29 MED ORDER — DIPHENHYDRAMINE HCL 25 MG PO CAPS
25.0000 mg | ORAL_CAPSULE | Freq: Four times a day (QID) | ORAL | Status: DC | PRN
  Filled 2015-11-29: qty 1

## 2015-11-29 MED ORDER — CEFAZOLIN SODIUM-DEXTROSE 2-4 GM/100ML-% IV SOLN
INTRAVENOUS | Status: AC
  Filled 2015-11-29: qty 100

## 2015-11-29 MED ORDER — SIMETHICONE 80 MG PO CHEW
80.0000 mg | CHEWABLE_TABLET | ORAL | Status: DC
  Administered 2015-11-29 – 2015-11-30 (×2): 80 mg via ORAL
  Filled 2015-11-29 (×2): qty 1

## 2015-11-29 MED ORDER — CEFAZOLIN SODIUM-DEXTROSE 2-4 GM/100ML-% IV SOLN: 2.0000 g | INTRAVENOUS | Status: DC

## 2015-11-29 MED ORDER — COCONUT OIL OIL: 1.0000 "application " | TOPICAL_OIL | Status: DC | PRN

## 2015-11-29 MED ORDER — ACETAMINOPHEN 325 MG PO TABS
ORAL_TABLET | ORAL | Status: AC
  Administered 2015-11-29: 650 mg via ORAL
  Filled 2015-11-29: qty 2

## 2015-11-29 MED ORDER — ONDANSETRON HCL 4 MG/2ML IJ SOLN: 4.0000 mg | Freq: Four times a day (QID) | INTRAMUSCULAR | Status: DC | PRN

## 2015-11-29 MED ORDER — DIBUCAINE 1 % RE OINT: 1.0000 "application " | TOPICAL_OINTMENT | RECTAL | Status: DC | PRN

## 2015-11-29 MED ORDER — PHENYLEPHRINE HCL 10 MG/ML IJ SOLN
INTRAMUSCULAR | Status: DC | PRN
  Administered 2015-11-29: 50 ug via INTRAVENOUS

## 2015-11-29 MED ORDER — HYDROMORPHONE HCL 2 MG PO TABS
2.0000 mg | ORAL_TABLET | ORAL | Status: DC | PRN
  Administered 2015-11-30 (×3): 2 mg via ORAL
  Administered 2015-11-30: 4 mg via ORAL
  Administered 2015-12-01 (×3): 2 mg via ORAL
  Filled 2015-11-29 (×2): qty 1
  Filled 2015-11-29: qty 2
  Filled 2015-11-29 (×4): qty 1

## 2015-11-29 MED ORDER — WITCH HAZEL-GLYCERIN EX PADS: 1.0000 "application " | MEDICATED_PAD | CUTANEOUS | Status: DC | PRN

## 2015-11-29 MED ORDER — DIPHENHYDRAMINE HCL 25 MG PO CAPS
25.0000 mg | ORAL_CAPSULE | ORAL | Status: DC | PRN
  Administered 2015-11-29 – 2015-11-30 (×3): 25 mg via ORAL
  Filled 2015-11-29 (×2): qty 1

## 2015-11-29 MED ORDER — ONDANSETRON HCL 4 MG/2ML IJ SOLN: 4.0000 mg | Freq: Three times a day (TID) | INTRAMUSCULAR | Status: DC | PRN

## 2015-11-29 MED ORDER — BUPIVACAINE 0.25 % ON-Q PUMP DUAL CATH 400 ML
INJECTION | Status: AC
  Filled 2015-11-29: qty 400

## 2015-11-29 MED ORDER — BUPIVACAINE HCL (PF) 0.5 % IJ SOLN
INTRAMUSCULAR | Status: AC
  Filled 2015-11-29: qty 30

## 2015-11-29 MED ORDER — SODIUM CHLORIDE 0.9% FLUSH: 3.0000 mL | INTRAVENOUS | Status: DC | PRN

## 2015-11-29 SURGICAL SUPPLY — 28 items
BAG COUNTER SPONGE EZ (MISCELLANEOUS) ×2 IMPLANT
CANISTER SUCT 3000ML (MISCELLANEOUS) ×3 IMPLANT
CATH KIT ON-Q SILVERSOAK 5IN (CATHETERS) ×6 IMPLANT
CHLORAPREP W/TINT 26ML (MISCELLANEOUS) ×6 IMPLANT
CLOSURE WOUND 1/2 X4 (GAUZE/BANDAGES/DRESSINGS) ×1
COUNTER SPONGE BAG EZ (MISCELLANEOUS) ×1
DRSG TELFA 3X8 NADH (GAUZE/BANDAGES/DRESSINGS) ×3 IMPLANT
ELECT CAUTERY BLADE 6.4 (BLADE) ×3 IMPLANT
ELECT REM PT RETURN 9FT ADLT (ELECTROSURGICAL) ×3
ELECTRODE REM PT RTRN 9FT ADLT (ELECTROSURGICAL) ×1 IMPLANT
GAUZE SPONGE 4X4 12PLY STRL (GAUZE/BANDAGES/DRESSINGS) ×3 IMPLANT
GLOVE BIO SURGEON STRL SZ7 (GLOVE) ×12 IMPLANT
GLOVE INDICATOR 7.5 STRL GRN (GLOVE) ×12 IMPLANT
GOWN STRL REUS W/ TWL LRG LVL3 (GOWN DISPOSABLE) ×3 IMPLANT
GOWN STRL REUS W/TWL LRG LVL3 (GOWN DISPOSABLE) ×6
LIQUID BAND (GAUZE/BANDAGES/DRESSINGS) ×3 IMPLANT
NS IRRIG 1000ML POUR BTL (IV SOLUTION) ×3 IMPLANT
PACK C SECTION AR (MISCELLANEOUS) ×3 IMPLANT
PAD OB MATERNITY 4.3X12.25 (PERSONAL CARE ITEMS) ×3 IMPLANT
PAD PREP 24X41 OB/GYN DISP (PERSONAL CARE ITEMS) ×3 IMPLANT
STAPLER INSORB 30 2030 C-SECTI (MISCELLANEOUS) ×3 IMPLANT
STRIP CLOSURE SKIN 1/2X4 (GAUZE/BANDAGES/DRESSINGS) ×2 IMPLANT
SUT MNCRL AB 4-0 PS2 18 (SUTURE) ×3 IMPLANT
SUT PDS AB 1 TP1 96 (SUTURE) IMPLANT
SUT PLAIN 2 0 XLH (SUTURE) ×3 IMPLANT
SUT VIC AB 0 CTX 36 (SUTURE) ×4
SUT VIC AB 0 CTX36XBRD ANBCTRL (SUTURE) ×2 IMPLANT
SUT VIC AB 2-0 CT1 36 (SUTURE) ×3 IMPLANT

## 2015-11-29 NOTE — Discharge Summary (Signed)
Obstetric Discharge Summary Reason for Admission: cesarean section Prenatal Procedures: none Intrapartum Procedures: cesarean: low cervical, transverse Postpartum Procedures: none Complications-Operative and Postpartum: none Hemoglobin  Date Value Ref Range Status  11/28/2015 12.5 12.0 - 16.0 g/dL Final   HGB  Date Value Ref Range Status  11/04/2013 12.3 12.0 - 16.0 g/dL Final   HCT  Date Value Ref Range Status  11/28/2015 35.7 35.0 - 47.0 % Final  11/04/2013 37.7 35.0 - 47.0 % Final    PP HCT: 32.7  Physical Exam:  Vitals:   11/30/15 2350 12/01/15 0421 12/01/15 0820 12/01/15 1118  BP:   119/72   Pulse:   90   Resp:   20   Temp: 98.1 F (36.7 C) 98.6 F (37 C) 98.4 F (36.9 C) 98.5 F (36.9 C)  TempSrc: Oral Oral Oral Oral  SpO2:   98%   Weight:      Height:       General: alert, appears stated age and no distress Lochia: appropriate Uterine Fundus: firm Incision: healing well DVT Evaluation: No evidence of DVT seen on physical exam.  Discharge Diagnoses: Term Pregnancy-delivered  Discharge Information: Date: 11/29/2015 Activity: pelvic rest and no lifting >10lbs x 6 weeks, no driving first two weeks, no intercourse for 6 weeks Diet: routine Medications: PNV, Ibuprofen and Dilauded Condition: stable Discharge to: home Follow-up Information    Lorrene ReidSTAEBLER, ANDREAS M, MD. Go in 1 week(s).   Specialty:  Obstetrics and Gynecology Why:  wound re-check on Thursday December 08, 2015 at 1:30 PM.  Contact information: [address] [place] [id number] [phone]           Newborn Data: Live born female  Birth Weight: 7 lb 6.2 oz (3350 g) APGAR: 8, 9  Feeding: bottle Contraception: probably OCP, will discuss at 6 wk PP visit  Home with mother.  Lorrene ReidSTAEBLER, ANDREAS M 11/29/2015, 9:14 AM   Updated 12/01/15 11:33 AM Imajean Mcdermid, Delaney Meigsamara, CNM

## 2015-11-29 NOTE — Op Note (Signed)
Preoperative Diagnosis: 1) 25 y.o. [id number] at 5323w0d 2) History of prior cesarean section 3) Bicornuate uterus  Postoperative Diagnosis: 1) 25 y.o. [id number] at 3223w0d 2) History of prior cesarean section 3) Bicornuate uterus  Operation Performed: Repeat low transverse C-section via pfannenstiel skin incision  Indiciation: 41 weeks did not achieve spontaneous labor, history of prior cesarean  Anesthesia: .Choice  Primary Surgeon: Vena AustriaAndreas Tamar Miano, MD   Assistant:Colleen Guitierez, CNM, Tammy Brothers CNM  Preoperative Antibiotics: 2g ancef  Estimated Blood Loss: 700mL  IV Fluids: 1800mL  Urine Output:: 175mL  Drains or Tubes: Foley to gravity drainage, ON-Q catheter system  Implants: none  Specimens Removed: none  Complications: none  Intraoperative Findings:  Normal tubes ovaries and uterus, significant scarring of the rectus fascia.  Delivery resulted in the birth of a liveborn female, APGAR (1 MIN): 8   APGAR (5 MINS): 9, weight pending  Patient Condition:stable  Procedure in Detail:  Patient was taken to the operating room were she was administered regional anesthesia.  She was positioned in the supine position, prepped and draped in the  Usual sterile fashion.  Prior to proceeding with the case a time out was performed and the level of anesthetic was checked and noted to be adequate.  Utilizing the scalpel a pfannenstiel skin incision was made 2cm above the pubic symphysis utilizing the patient's pre-existing scar and carried down sharply to the the level of the rectus fascia.  The fascia was incised in the midline using the scalpel and then extended using mayo scissors.  The superior border of the rectus fascia was grasped with two Kocher clamps and the underlying rectus muscles were dissected of the fascia using blunt dissection.  The median raphae was incised using Mayo scissors.   The inferior border of the rectus fascia was dissected of the rectus muscles in a similar  fashion.  The midline was identified and freed up using Metzenbaum scissors, the peritoneum was entered bluntly and expanded using manual tractions.  The uterus was noted to be in a none rotated position.  Next the bladder blade was placed retracting the bladder caudally.  A bladder flap was created.  The bladder reflection was grasped with a pickup, and Metzenbaum scissors were then used the undermine the bladder reflection.  The bladder flap was developed using digital dissection.  The bladder blade was replaced retracting the bladder caudally out of the operative field. A low transverse incision was scored on the lower uterine segment.  The hysterotomy was entered bluntly using the operators finger.  The hysterotomy incision was extended using manual traction.  The operators hand was placed within the hysterotomy position noting the fetus to be within the OA position.  The vertex was grasped, flexed, brought to the incision, and delivered a traumatically using fundal pressure.  The right arm was splinted across the fetal chest and delivered to allow delivery of the posterior shoulder.  The remainder of the body delivered with ease.  The infant was suctioned, cord was clamped and cut before handing off to the awaiting neonatologist.  The placenta was delivered using manual extraction.  The uterus was exteriorized, wiped clean of clots and debris using two moist laps.  The hysterotomy was closed using a two layer closure of 0 Vicryl, with the first being a running locked, the second a vertical imbricating.  The uterus was returned to the abdomen.  The peritoneal gutters were wiped clean of clots and debris using two moist laps.  The hysterotomy incision was  re-inspected noted to be hemostatic.  TThe superior border of the rectus fascia was grasped with a Kocher clamp.  The ON-Q trocars were then placed 4cm above the superior border of the incision and tunneled subfascially.  The introducers were removed and the  catheters were threaded through the sleeves after which the sleeves were removed.  The fascia was closed using a looped #1 PDS in a running fashion taking 1cm by 1cm bites.  The subcutaneous tissue was irrigated using warm saline, hemostasis achieved using the bovie.  The subcutaneous dead space was greater than 3cm and was closed.  The subcutaneous dead space was obliterated by using a 53-T 0 Chromic in a running fashion.   The sking was closed using 4-0 Monocryl in a subcuticular fashion.  Sponge needle and instrument counts were corrects times two.  The patient tolerated the procedure well and was taken to the recovery room in stable condition.

## 2015-11-29 NOTE — Anesthesia Preprocedure Evaluation (Signed)
Anesthesia Evaluation  Patient identified by MRN, date of birth, ID band Patient awake    Reviewed: Allergy & Precautions, NPO status , Patient's Chart, lab work & pertinent test results  History of Anesthesia Complications Negative for: history of anesthetic complications  Airway Mallampati: III  TM Distance: >3 FB Neck ROM: Full    Dental no notable dental hx.    Pulmonary neg pulmonary ROS, neg sleep apnea, neg COPD,    breath sounds clear to auscultation- rhonchi (-) wheezing      Cardiovascular Exercise Tolerance: Good (-) hypertension(-) CAD and (-) Past MI  Rhythm:Regular Rate:Normal - Systolic murmurs and - Diastolic murmurs    Neuro/Psych negative neurological ROS  negative psych ROS   GI/Hepatic negative GI ROS, Neg liver ROS,   Endo/Other  negative endocrine ROS  Renal/GU negative Renal ROS     Musculoskeletal   Abdominal (+) + obese, Gravid abdomen   Peds  Hematology negative hematology ROS (+)   Anesthesia Other Findings   Reproductive/Obstetrics (+) Pregnancy                             Anesthesia Physical Anesthesia Plan  ASA: II  Anesthesia Plan: Spinal   Post-op Pain Management:    Induction:   Airway Management Planned:   Additional Equipment:   Intra-op Plan:   Post-operative Plan:   Informed Consent: I have reviewed the patients History and Physical, chart, labs and discussed the procedure including the risks, benefits and alternatives for the proposed anesthesia with the patient or authorized representative who has indicated his/her understanding and acceptance.     Plan Discussed with: CRNA and Anesthesiologist  Anesthesia Plan Comments:         Lab Results  Component Value Date   WBC 7.2 11/28/2015   HGB 12.5 11/28/2015   HCT 35.7 11/28/2015   MCV 82.4 11/28/2015   PLT 167 11/28/2015    Anesthesia Quick Evaluation

## 2015-11-29 NOTE — Transfer of Care (Signed)
Immediate Anesthesia Transfer of Care Note  Patient: Sandy LeaderJasmine M Achee  Procedure(s) Performed: Procedure(s): CESAREAN SECTION Baby Girl 7lb., 6oz. (N/A)  Patient Location: PACU  Anesthesia Type:Spinal  Level of Consciousness: awake, alert  and oriented  Airway & Oxygen Therapy: Patient Spontanous Breathing  Post-op Assessment: Report given to RN and Post -op Vital signs reviewed and stable  Post vital signs: Reviewed and stable  Last Vitals:  Vitals:   11/29/15 0926 11/29/15 0930  BP: (!) 104/53 (!) 104/53  Pulse: (!) 54   Resp: 14 16  Temp: 36.8 C     Last Pain:  Vitals:   11/29/15 0926  TempSrc:   PainSc: 0-No pain         Complications: No apparent anesthesia complications

## 2015-11-29 NOTE — Anesthesia Procedure Notes (Signed)
Spinal  Patient location during procedure: OB Start time: 11/29/2015 7:45 AM End time: 11/29/2015 7:56 AM Staffing Anesthesiologist: Randa Lynn AMY Resident/CRNA: Jonna Clark Performed: resident/CRNA  Preanesthetic Checklist Completed: patient identified, site marked, surgical consent, pre-op evaluation, timeout performed, IV checked, risks and benefits discussed and monitors and equipment checked Spinal Block Patient position: sitting Prep: ChloraPrep Patient monitoring: heart rate, continuous pulse ox, blood pressure and cardiac monitor Approach: midline Location: L4-5 Injection technique: single-shot Needle Needle type: Whitacre and Introducer  Needle gauge: 24 G Needle length: 9 cm Assessment Sensory level: T4 Additional Notes Negative paresthesia. Negative blood return. Positive free-flowing CSF. Expiration date of kit checked and confirmed. Patient tolerated procedure well, without complications.

## 2015-11-29 NOTE — Anesthesia Postprocedure Evaluation (Signed)
Anesthesia Post Note  Patient: Sandy [name]  Procedure(s) Performed: Procedure(s) (LRB): CESAREAN SECTION Baby Girl 7lb., 6oz. (N/A)  Patient location during evaluation: PACU Anesthesia Type: Spinal Level of consciousness: oriented and awake and alert Pain management: pain level controlled Vital Signs Assessment: post-procedure vital signs reviewed and stable Respiratory status: spontaneous breathing, respiratory function stable and nonlabored ventilation Cardiovascular status: blood pressure returned to baseline and stable Postop Assessment: no headache, no backache and spinal receding Anesthetic complications: no    Last Vitals:  Vitals:   11/29/15 0930 11/29/15 0956  BP: (!) 104/53 112/68  Pulse:  64  Resp: 16 16  Temp:      Last Pain:  Vitals:   11/29/15 0956  TempSrc:   PainSc: 0-No pain                 Anand Tejada

## 2015-11-29 NOTE — H&P (Signed)
Date of Initial H&P:  11/23/15  History reviewed, patient examined, no change in status, stable for surgery.

## 2015-11-30 LAB — CBC
HCT: 32.7 % — ABNORMAL LOW (ref 35.0–47.0)
HEMOGLOBIN: 11.6 g/dL — AB (ref 12.0–16.0)
MCH: 29.6 pg (ref 26.0–34.0)
MCHC: 35.6 g/dL (ref 32.0–36.0)
MCV: 83.3 fL (ref 80.0–100.0)
Platelets: 122 10*3/uL — ABNORMAL LOW (ref 150–440)
RBC: 3.93 MIL/uL (ref 3.80–5.20)
RDW: 15.8 % — ABNORMAL HIGH (ref 11.5–14.5)
WBC: 7 10*3/uL (ref 3.6–11.0)

## 2015-12-01 ENCOUNTER — Encounter: Payer: Self-pay | Admitting: Advanced Practice Midwife

## 2015-12-01 MED ORDER — IBUPROFEN 600 MG PO TABS: 600.0000 mg | ORAL_TABLET | Freq: Four times a day (QID) | ORAL | 0 refills | Status: DC

## 2015-12-01 MED ORDER — HYDROMORPHONE HCL 2 MG PO TABS: 2.0000 mg | ORAL_TABLET | ORAL | 0 refills | Status: DC | PRN

## 2015-12-01 NOTE — Discharge Instructions (Signed)
Discharge instructions:  ° °Call office if you have any of the following: headache, visual changes, fever >100 F, chills, breast concerns, excessive vaginal bleeding, incision drainage or problems, leg pain or redness, depression or any other concerns.  ° °Activity: Do not lift > 10 lbs for 6 weeks.  °No intercourse or tampons for 6 weeks.  °No driving for 1-2 weeks.  ° °

## 2015-12-01 NOTE — Progress Notes (Signed)
Patient understands all discharge instructions and the need to make follow up appointments. Patient discharge via wheelchair with auxillary. 

## 2015-12-01 NOTE — Progress Notes (Signed)
Discharge instructions, prescriptions and follow up appointment given to and reviewed with patient. Patient verbalized understanding. 

## 2015-12-02 NOTE — Progress Notes (Signed)
Late entry note from visit on 8/23   Subjective:   Pt is doing well on Post Op Day 1. She is ambulating and voiding without difficulty. She is tolerating PO intake and her pain is controlled with PO meds and On Q pump.   Objective:  Blood pressure 119/72, pulse 90, temperature 98.5 F (36.9 C), temperature source Oral, resp. rate 20, height 5\' 5"  (1.651 m), weight 111.1 kg (245 lb), SpO2 98 %.  General: NAD Pulmonary: no increased work of breathing Abdomen: non-distended, non-tender, fundus firm at level of umbilicus Incision: Dressing is C/D/I Extremities: no edema, no erythema, no tenderness   Assessment:   25 y.o. [id number] postoperativeday # 1   Plan:  1) Acute blood loss anemia - hemodynamically stable and asymptomatic - po ferrous sulfate  2) A+, Rubella Immune, Varicella Immune  3) TDAP status: UTD   4) Bottle/Contraception: considering OCPs  5) Disposition: Day of discharge depends on progress   Sandy [name], CNM

## 2015-12-03 ENCOUNTER — Encounter: Payer: Self-pay | Admitting: Emergency Medicine

## 2015-12-03 ENCOUNTER — Emergency Department
Admission: EM | Admit: 2015-12-03 | Discharge: 2015-12-03 | Disposition: A | Discharge status: Home / Self Care | Payer: Medicaid Other | Attending: Emergency Medicine | Admitting: Emergency Medicine

## 2015-12-03 DIAGNOSIS — O9089 Other complications of the puerperium, not elsewhere classified: Secondary | ICD-10-CM | POA: Diagnosis present

## 2015-12-03 DIAGNOSIS — O909 Complication of the puerperium, unspecified: Secondary | ICD-10-CM

## 2015-12-03 NOTE — ED Provider Notes (Signed)
[facility] Emergency Department Provider Note  ____________________________________________  Time seen: Approximately 8:52 PM  I have reviewed the triage vital signs and the nursing notes.   HISTORY  Chief Complaint Post-op Problem    HPI Sandy [name] is a 25 y.o. female 4d s/p c/s presenting for inability to remove a pain pump tube and concern for surrounding infection due to erythema on the surrounding skin. The patient has early been seen by Dr. Tiburcio PeaHarris, her OB/GYN, who has successfully remove the pain pump and evaluated the patient stating that she had some contact reaction to the tape that no evidence of infection at this time. Overall, the patient states that she is feeling well, she does not have any pain, has not had any fever.   Past Medical History:  Diagnosis Date  . Bicornate uterus   . Family history of adverse reaction to anesthesia    uncle has a hard time waking up  . Genital HSV   . Obesity affecting pregnancy    BMI>40    Patient Active Problem List   Diagnosis Date Noted  . H/O: cesarean section 11/29/2015  . Labor and delivery, indication for care 10/16/2015  . Back pain 09/27/2015  . UTI (lower urinary tract infection) 09/27/2015  . Abdominal pain affecting pregnancy 09/19/2015  . Vaginal discharge 08/08/2015  . Vaginal bleeding in pregnancy 08/08/2015  . Traumatic injury during pregnancy in second trimester 08/07/2015    Past Surgical History:  Procedure Laterality Date  . CESAREAN SECTION     for HSV outbreak  . CESAREAN SECTION N/A 11/29/2015   Procedure: CESAREAN SECTION Baby Girl 7lb.Jerrilyn Cairo, 6oz.;  Surgeon: Vena AustriaAndreas Staebler, MD;  Location: [facility] ORS;  Service: Obstetrics;  Laterality: N/A;  . ears Bilateral    tubes  . TONSILLECTOMY      Current Outpatient Rx  . Order #: [id number] Class: Print  . Order #: [id number] Class: Print  . Order #: [id number] Class: Historical Med    Allergies Percocet  [oxycodone-acetaminophen]  Family History  Problem Relation Age of Onset  . Diabetes Maternal Aunt   . Hyperlipidemia Maternal Aunt     Social History Social History  Substance Use Topics  . Smoking status: Never Smoker  . Smokeless tobacco: Never Used  . Alcohol use No    Review of Systems Constitutional: No fever/chills.No lightheadedness or syncope. Eyes: No visual changes. Cardiovascular: Denies chest pain.  Respiratory: Denies shortness of breath.   Gastrointestinal: Mild abdominal discomfort at her low transverse surgical site.  No nausea, no vomiting.  No diarrhea.  No constipation. Genitourinary: Negative for dysuria. Musculoskeletal: Negative for back pain. Skin: Positive for rash. Neurological: Negative for headaches. No difficulty walking   10-point ROS otherwise negative.  ____________________________________________   PHYSICAL EXAM:  VITAL SIGNS: ED Triage Vitals  Enc Vitals Group     BP 12/03/15 1934 119/76     Pulse Rate 12/03/15 1934 83     Resp 12/03/15 1934 18     Temp 12/03/15 1934 98.1 F (36.7 C)     Temp Source 12/03/15 1934 Oral     SpO2 12/03/15 1934 99 %     Weight 12/03/15 1944 225 lb (102.1 kg)     Height 12/03/15 1944 5\' 5"  (1.651 m)     Head Circumference --      Peak Flow --      Pain Score 12/03/15 1944 7     Pain Loc --      Pain Edu? --  Excl. in GC? --     Constitutional: Alert and oriented. Well appearing and in no acute distress. Answers questions appropriately. Eyes: Conjunctivae are normal.  EOMI. No scleral icterus. Head: Atraumatic. Nose: No congestion/rhinnorhea. Mouth/Throat: Mucous membranes are moist.  Neck: No stridor.  Supple.   Cardiovascular: Normal rate Respiratory: Normal respiratory effort.  Gastrointestinal: Obese and postpartum. Soft, minimal tenderness diffusely especially in the lower abdomen and nondistended.  No guarding or rebound.  No peritoneal signs. Low transverse surgical incision which is  well healing without any swelling, erythema or purulent drainage. The patient has a punctate wound in the suprapubic area where her pain pump was that does not have any purulent drainage or focal swelling. The overlying area does have some mild erythema that is in the shape of the overlying tape that was there before. Musculoskeletal: Moves all extremities without pain Neurologic:  A&Ox3.  Speech is clear.  Face and smile are symmetric.  EOMI.  Moves all extremities well. Skin:  Skin is warm, dry. Psychiatric: Mood and affect are normal. Speech and behavior are normal.  Normal judgement.  ____________________________________________   LABS (all labs ordered are listed, but only abnormal results are displayed)  Labs Reviewed - No data to display ____________________________________________  EKG  Not indicated ____________________________________________  RADIOLOGY  No results found.  ____________________________________________   PROCEDURES  Procedure(s) performed: None  Procedures  Critical Care performed: No ____________________________________________   INITIAL IMPRESSION / ASSESSMENT AND PLAN / ED COURSE  Pertinent labs & imaging results that were available during my care of the patient were reviewed by me and considered in my medical decision making (see chart for details).  25 y.o. female 4 days after C-section presenting for retained pain pump and concern for infection. The patient's pain pump has been removed by her primary OB/GYN and she does not have evidence of infection at this time. We did discuss return precautions as well as follow-up instructions. The patient will be discharged in stable condition.  ____________________________________________  FINAL CLINICAL IMPRESSION(S) / ED DIAGNOSES  Final diagnoses:  Cesarean section wound complication    Clinical Course      NEW MEDICATIONS STARTED DURING THIS VISIT:  New Prescriptions   No medications  on file      Rockne Menghini, MD 12/03/15 2056

## 2015-12-03 NOTE — H&P (Signed)
Obstetrics & Gynecology ER/Consult/H&P Note  Date of Consultation: 12/03/2015   [facility]  [facility] [place]  Reason for Consultation: Pain pump concerns  History of Present Illness: Sandy [name] is a 25 y.o. G3P3 s/p CS 4 days ago, with the above CC. She had OnQ pain pump that is due out today, but husband unable (scared) to remove.  Bruising of skin.  No fever, discharge, pain, bleeding (other than lochia).  ROS: A 12-point review of systems was performed and negative, except as stated in the above HPI.  OBGYN History: As per HPI. OB History    Gravida Para Term Preterm AB Living   3 2 2     2    SAB TAB Ectopic Multiple Live Births         0 2      Past Medical History: Past Medical History:  Diagnosis Date  . Bicornate uterus   . Family history of adverse reaction to anesthesia    uncle has a hard time waking up  . Genital HSV   . Obesity affecting pregnancy    BMI>40    Past Surgical History: Past Surgical History:  Procedure Laterality Date  . CESAREAN SECTION     for HSV outbreak  . CESAREAN SECTION N/A 11/29/2015   Procedure: CESAREAN SECTION Baby Girl 7lb.Jerrilyn Cairo, 6oz.;  Surgeon: Vena AustriaAndreas Staebler, MD;  Location: [facility] ORS;  Service: Obstetrics;  Laterality: N/A;  . ears Bilateral    tubes  . TONSILLECTOMY      Family History:  Family History  Problem Relation Age of Onset  . Diabetes Maternal Aunt   . Hyperlipidemia Maternal Aunt    She denies any female cancers, bleeding or blood clotting disorders.   Social History:  Social History   Social History  . Marital status: Legally Separated    Spouse name: N/A  . Number of children: N/A  . Years of education: N/A   Occupational History  . Not on file.   Social History Main Topics  . Smoking status: Never Smoker  . Smokeless tobacco: Never Used  . Alcohol use No  . Drug use: No  . Sexual activity: Yes   Other Topics Concern  . Not on  file   Social History Narrative  . No narrative on file   Allergy: Allergies  Allergen Reactions  . Percocet [Oxycodone-Acetaminophen] Itching    Current Outpatient Medications: none  Hospital Medications: No current facility-administered medications for this encounter.    Current Outpatient Prescriptions  Medication Sig Dispense Refill  . HYDROmorphone (DILAUDID) 2 MG tablet Take 1-2 tablets (2-4 mg total) by mouth every 4 (four) hours as needed for moderate pain or severe pain. 30 tablet 0  . ibuprofen (ADVIL,MOTRIN) 600 MG tablet Take 1 tablet (600 mg total) by mouth every 6 (six) hours. 30 tablet 0  . Prenatal Vit-Fe Fumarate-FA (MULTIVITAMIN-PRENATAL) 27-0.8 MG TABS tablet Take 1 tablet by mouth daily at 12 noon.       Physical Exam: Vitals:   12/03/15 1934 12/03/15 1944  BP: 119/76   Pulse: 83   Resp: 18   Temp: 98.1 F (36.7 C)   TempSrc: Oral   SpO2: 99%   Weight:  225 lb (102.1 kg)  Height:  5\' 5"  (1.651 m)    Temp:  [98.1 F (36.7 C)] 98.1 F (36.7 C) (08/26 1934) Pulse Rate:  [83] 83 (08/26 1934) Resp:  [18] 18 (08/26 1934) BP: (119)/(76)  119/76 (08/26 1934) SpO2:  [99 %] 99 % (08/26 1934) Weight:  [225 lb (102.1 kg)] 225 lb (102.1 kg) (08/26 1944) No intake/output data recorded. No intake/output data recorded. No intake or output data in the 24 hours ending 12/03/15 2029   Current Vital Signs 24h Vital Sign Ranges  T 98.1 F (36.7 C) Temp  Avg: 98.1 F (36.7 C)  Min: 98.1 F (36.7 C)  Max: 98.1 F (36.7 C)  BP 119/76 BP  Min: 119/76  Max: 119/76  HR 83 Pulse  Avg: 83  Min: 83  Max: 83  RR 18 Resp  Avg: 18  Min: 18  Max: 18  SaO2 99 % Not Delivered SpO2  Avg: 99 %  Min: 99 %  Max: 99 %       24 Hour I/O Current Shift I/O  Time Ins Outs No intake/output data recorded. No intake/output data recorded.   Patient Vitals for the past 8 hrs:  BP Temp Temp src Pulse Resp SpO2 Height Weight  12/03/15 1944 - - - - - - 5\' 5"  (1.651 m) 225 lb (102.1  kg)  12/03/15 1934 119/76 98.1 F (36.7 C) Oral 83 18 99 % - -    Body mass index is 37.44 kg/m. General appearance: Well nourished, well developed female in no acute distress.  Neck:  Supple, normal appearance, and no thyromegaly  Cardiovascular:Regular rate and rhythm.  No murmurs, rubs or gallops. Respiratory:  Clear to auscultation bilateral. Normal respiratory effort Abdomen: positive bowel sounds and no masses, hernias; diffusely non tender to palpation, non distended Neuro/Psych:  Normal mood and affect.  Skin:  Warm and dry.      OnQ cathaters easily removed.  Skin inc intact.  Bruising to skin, no erythema, no discharge. Lymphatic:  No inguinal lymphadenopathy.   Assessment: [name] is a 25 y.o. G3P3 who presented to the ED with complaints of difficulty with OnQ Pain Pump and its removal.  Plan: I removed On Q pain pump.  Counseled on care of skin. Pt discharged from ER.  Annamarie Major, MD [facility] OBGYN Pager [phone]

## 2015-12-03 NOTE — Discharge Instructions (Signed)
Please keep your follow-up appointment. Continue to monitor your surgical sites for redness, swelling, or pus; if he develop any of these or fever, seeking immediate medical attention.

## 2015-12-03 NOTE — ED Triage Notes (Signed)
Pt states that she had baby via c-section Tuesday. Pt states that her On-Q pump has lodged in her belly and was supposed to be gently pulled out by this time. Pt is alert and oriented at this time with NAD.

## 2016-03-26 ENCOUNTER — Ambulatory Visit
Admission: EM | Admit: 2016-03-26 | Discharge: 2016-03-26 | Disposition: A | Discharge status: Home / Self Care | Payer: Medicaid Other | Attending: Family Medicine | Admitting: Family Medicine

## 2016-03-26 ENCOUNTER — Encounter: Payer: Self-pay | Admitting: *Deleted

## 2016-03-26 DIAGNOSIS — H1031 Unspecified acute conjunctivitis, right eye: Secondary | ICD-10-CM | POA: Diagnosis not present

## 2016-03-26 MED ORDER — GENTAMICIN SULFATE 0.3 % OP SOLN: 2.0000 [drp] | Freq: Three times a day (TID) | OPHTHALMIC | 0 refills | Status: DC

## 2016-03-26 NOTE — ED Provider Notes (Signed)
[facility]    CSN: [id number] Arrival date & time: 03/26/16  1201     History   Chief Complaint Chief Complaint  Patient presents with  . Eye Pain    HPI Sandy [name] is a 25 y.o. female.   Patient reports having a GI bug Friday this report 11 Sunday she notes the right eye was matted and irritated. It was matted more this morning so she came in to be seen and evaluated. She states she's had pinkeye before and knows the routine. She reports some swelling and pressure around the right eye but otherwise no nasal congestion or cough. Past smoking history she has a bicornate uterus and history of obesity. She also has history of genital HSV only surgery was C-section and tonsillectomy in the past. Family history is positive for diabetes and hyperlipidemia and she's never smoked. She is allergic to Percocet.   The history is provided by the patient. No language interpreter was used.  Eye Pain  This is a new problem. The current episode started yesterday. The problem occurs constantly. The problem has been gradually worsening. Pertinent negatives include no chest pain, no abdominal pain, no headaches and no shortness of breath. Nothing aggravates the symptoms. Nothing relieves the symptoms. She has tried acetaminophen for the symptoms. The treatment provided no relief.    Past Medical History:  Diagnosis Date  . Bicornate uterus   . Family history of adverse reaction to anesthesia    uncle has a hard time waking up  . Genital HSV   . Obesity affecting pregnancy    BMI>40    Patient Active Problem List   Diagnosis Date Noted  . H/O: cesarean section 11/29/2015  . Labor and delivery, indication for care 10/16/2015  . Back pain 09/27/2015  . UTI (lower urinary tract infection) 09/27/2015  . Abdominal pain affecting pregnancy 09/19/2015  . Vaginal discharge 08/08/2015  . Vaginal bleeding in pregnancy 08/08/2015  . Traumatic injury during pregnancy in second  trimester 08/07/2015    Past Surgical History:  Procedure Laterality Date  . CESAREAN SECTION     for HSV outbreak  . CESAREAN SECTION N/A 11/29/2015   Procedure: CESAREAN SECTION Baby Girl 7lb.Jerrilyn Cairo, 6oz.;  Surgeon: Vena AustriaAndreas Staebler, MD;  Location: [facility] ORS;  Service: Obstetrics;  Laterality: N/A;  . ears Bilateral    tubes  . TONSILLECTOMY      OB History    Gravida Para Term Preterm AB Living   3 2 2     2    SAB TAB Ectopic Multiple Live Births         0 2       Home Medications    Prior to Admission medications   Medication Sig Start Date End Date Taking? Authorizing Provider  gentamicin (GARAMYCIN) 0.3 % ophthalmic solution Place 2 drops into the right eye 3 (three) times daily. Next 3-5 days 03/26/16   Hassan RowanEugene Dashanna Kinnamon, MD  HYDROmorphone (DILAUDID) 2 MG tablet Take 1-2 tablets (2-4 mg total) by mouth every 4 (four) hours as needed for moderate pain or severe pain. 12/01/15   Marta Antuamara Brothers, CNM  ibuprofen (ADVIL,MOTRIN) 600 MG tablet Take 1 tablet (600 mg total) by mouth every 6 (six) hours. 12/01/15   Marta Antuamara Brothers, CNM  Prenatal Vit-Fe Fumarate-FA (MULTIVITAMIN-PRENATAL) 27-0.8 MG TABS tablet Take 1 tablet by mouth daily at 12 noon.    Historical Provider, MD    Family History Family History  Problem Relation Age of Onset  .  Diabetes Maternal Aunt   . Hyperlipidemia Maternal Aunt     Social History Social History  Substance Use Topics  . Smoking status: Never Smoker  . Smokeless tobacco: Never Used  . Alcohol use No     Allergies   Percocet [oxycodone-acetaminophen]   Review of Systems Review of Systems  Eyes: Positive for pain, discharge, redness and itching.  Respiratory: Negative for shortness of breath.   Cardiovascular: Negative for chest pain.  Gastrointestinal: Negative for abdominal pain.  Neurological: Negative for headaches.  All other systems reviewed and are negative.    Physical Exam Triage Vital Signs ED Triage Vitals  Enc Vitals Group      BP 03/26/16 1457 122/64     Pulse Rate 03/26/16 1457 85     Resp 03/26/16 1457 16     Temp 03/26/16 1457 98.2 F (36.8 C)     Temp Source 03/26/16 1457 Oral     SpO2 03/26/16 1457 100 %     Weight 03/26/16 1458 220 lb (99.8 kg)     Height 03/26/16 1458 5\' 5"  (1.651 m)     Head Circumference --      Peak Flow --      Pain Score 03/26/16 1503 8     Pain Loc --      Pain Edu? --      Excl. in GC? --    No data found.   Updated Vital Signs BP 122/64 (BP Location: Left Arm)   Pulse 85   Temp 98.2 F (36.8 C) (Oral)   Resp 16   Ht 5\' 5"  (1.651 m)   Wt 220 lb (99.8 kg)   LMP 03/06/2016   SpO2 100%   BMI 36.61 kg/m   Visual Acuity Right Eye Distance:   Left Eye Distance:   Bilateral Distance:    Right Eye Near:   Left Eye Near:    Bilateral Near:     Physical Exam  Constitutional: She is oriented to person, place, and time. She appears well-developed and well-nourished.  HENT:  Head: Normocephalic and atraumatic.  Eyes: EOM and lids are normal. Pupils are equal, round, and reactive to light. Right eye exhibits discharge. Left eye exhibits no discharge and no exudate. No foreign body present in the left eye. Right conjunctiva is injected. Left conjunctiva is not injected.  Neck: Normal range of motion. Neck supple.  Pulmonary/Chest: Effort normal.  Musculoskeletal: Normal range of motion.  Neurological: She is alert and oriented to person, place, and time.  Skin: Skin is warm.  Psychiatric: She has a normal mood and affect.  Vitals reviewed.    UC Treatments / Results  Labs (all labs ordered are listed, but only abnormal results are displayed) Labs Reviewed - No data to display  EKG  EKG Interpretation None       Radiology No results found.  Procedures Procedures (including critical care time)  Medications Ordered in UC Medications - No data to display   Initial Impression / Assessment and Plan / UC Course  I have reviewed the triage vital signs  and the nursing notes.  Pertinent labs & imaging results that were available during my care of the patient were reviewed by me and considered in my medical decision making (see chart for details).  Clinical Course    We'll place patient on gentamicin eyedrops 2 drops in right eye 3 times a day for the next 3-5 days follow-up PCP of choice ophthalmologist choice the problem still persists.  Final Clinical Impressions(s) / UC Diagnoses   Final diagnoses:  Acute bacterial conjunctivitis of right eye    New Prescriptions Discharge Medication List as of 03/26/2016  3:36 PM       Note: This dictation was prepared with Dragon dictation along with smaller phrase technology. Any transcriptional errors that result from this process are unintentional.   Hassan RowanEugene Marline Morace, MD 03/26/16 [phone]

## 2016-03-26 NOTE — ED Triage Notes (Signed)
Patient started having symptom of pink eye yesterday AM which has progressed to right side facial swelling and pain. Patient has tried using eye drops with no resolution of symptoms.

## 2016-04-25 ENCOUNTER — Emergency Department: Payer: Medicaid Other

## 2016-04-25 ENCOUNTER — Emergency Department
Admission: EM | Admit: 2016-04-25 | Discharge: 2016-04-26 | Disposition: A | Discharge status: Home / Self Care | Payer: Medicaid Other | Attending: Student in an Organized Health Care Education/Training Program | Admitting: Student in an Organized Health Care Education/Training Program

## 2016-04-25 ENCOUNTER — Encounter: Payer: Self-pay | Admitting: Emergency Medicine

## 2016-04-25 DIAGNOSIS — R112 Nausea with vomiting, unspecified: Secondary | ICD-10-CM

## 2016-04-25 DIAGNOSIS — R17 Unspecified jaundice: Secondary | ICD-10-CM

## 2016-04-25 DIAGNOSIS — R1013 Epigastric pain: Secondary | ICD-10-CM | POA: Diagnosis not present

## 2016-04-25 DIAGNOSIS — R1011 Right upper quadrant pain: Secondary | ICD-10-CM | POA: Diagnosis present

## 2016-04-25 LAB — COMPREHENSIVE METABOLIC PANEL
ALT: 16 U/L (ref 14–54)
ANION GAP: 7 (ref 5–15)
AST: 24 U/L (ref 15–41)
Albumin: 4.1 g/dL (ref 3.5–5.0)
Alkaline Phosphatase: 80 U/L (ref 38–126)
BUN: 13 mg/dL (ref 6–20)
CO2: 22 mmol/L (ref 22–32)
Calcium: 9 mg/dL (ref 8.9–10.3)
Chloride: 108 mmol/L (ref 101–111)
Creatinine, Ser: 0.7 mg/dL (ref 0.44–1.00)
GFR calc non Af Amer: >60 mL/min (ref >60)
Glucose, Bld: 88 mg/dL (ref 65–99)
POTASSIUM: 4.6 mmol/L (ref 3.5–5.1)
SODIUM: 137 mmol/L (ref 135–145)
Total Bilirubin: 2.8 mg/dL — ABNORMAL HIGH (ref 0.3–1.2)
Total Protein: 7.4 g/dL (ref 6.5–8.1)

## 2016-04-25 LAB — URINALYSIS, COMPLETE (UACMP) WITH MICROSCOPIC
BACTERIA UA: NONE SEEN
Bilirubin Urine: NEGATIVE
GLUCOSE, UA: NEGATIVE mg/dL
Hgb urine dipstick: NEGATIVE
KETONES UR: NEGATIVE mg/dL
Leukocytes, UA: NEGATIVE
Nitrite: NEGATIVE
PROTEIN: NEGATIVE mg/dL
Specific Gravity, Urine: 1.026 (ref 1.005–1.030)
pH: 5 (ref 5.0–8.0)

## 2016-04-25 LAB — CBC
HEMATOCRIT: 43.1 % (ref 35.0–47.0)
HEMOGLOBIN: 14.8 g/dL (ref 12.0–16.0)
MCH: 28.4 pg (ref 26.0–34.0)
MCHC: 34.4 g/dL (ref 32.0–36.0)
MCV: 82.4 fL (ref 80.0–100.0)
Platelets: 172 10*3/uL (ref 150–440)
RBC: 5.23 MIL/uL — AB (ref 3.80–5.20)
RDW: 15.1 % — ABNORMAL HIGH (ref 11.5–14.5)
WBC: 8.2 10*3/uL (ref 3.6–11.0)

## 2016-04-25 LAB — LIPASE, BLOOD: Lipase: 18 U/L (ref 11–51)

## 2016-04-25 LAB — POCT PREGNANCY, URINE: PREG TEST UR: NEGATIVE

## 2016-04-25 MED ORDER — DICYCLOMINE HCL 10 MG PO CAPS
10.0000 mg | ORAL_CAPSULE | Freq: Once | ORAL | Status: AC
  Administered 2016-04-25: 10 mg via ORAL
  Filled 2016-04-25: qty 1

## 2016-04-25 MED ORDER — PROMETHAZINE HCL 25 MG/ML IJ SOLN
12.5000 mg | Freq: Once | INTRAMUSCULAR | Status: AC
  Administered 2016-04-25: 12.5 mg via INTRAVENOUS
  Filled 2016-04-25: qty 1

## 2016-04-25 MED ORDER — METOCLOPRAMIDE HCL 5 MG/ML IJ SOLN
10.0000 mg | Freq: Once | INTRAMUSCULAR | Status: AC
  Administered 2016-04-25: 10 mg via INTRAVENOUS
  Filled 2016-04-25: qty 2

## 2016-04-25 MED ORDER — MORPHINE SULFATE (PF) 4 MG/ML IV SOLN
4.0000 mg | INTRAVENOUS | Status: DC | PRN
  Administered 2016-04-25: 4 mg via INTRAVENOUS
  Filled 2016-04-25: qty 1

## 2016-04-25 MED ORDER — GI COCKTAIL ~~LOC~~
30.0000 mL | Freq: Once | ORAL | Status: AC
  Administered 2016-04-25: 30 mL via ORAL
  Filled 2016-04-25: qty 30

## 2016-04-25 MED ORDER — PROMETHAZINE HCL 12.5 MG PO TABS: 12.5000 mg | ORAL_TABLET | Freq: Four times a day (QID) | ORAL | 0 refills | Status: DC | PRN

## 2016-04-25 MED ORDER — SODIUM CHLORIDE 0.9 % IV BOLUS (SEPSIS)
1000.0000 mL | Freq: Once | INTRAVENOUS | Status: AC
  Administered 2016-04-25: 1000 mL via INTRAVENOUS

## 2016-04-25 MED ORDER — ONDANSETRON HCL 4 MG/2ML IJ SOLN
4.0000 mg | Freq: Once | INTRAMUSCULAR | Status: DC | PRN
  Filled 2016-04-25: qty 2

## 2016-04-25 MED ORDER — DICYCLOMINE HCL 10 MG PO CAPS: 10.0000 mg | ORAL_CAPSULE | Freq: Three times a day (TID) | ORAL | 0 refills | Status: DC | PRN

## 2016-04-25 MED ORDER — RANITIDINE HCL 150 MG PO TABS: 150.0000 mg | ORAL_TABLET | Freq: Two times a day (BID) | ORAL | 0 refills | Status: DC

## 2016-04-25 NOTE — ED Notes (Signed)
Patient transported to Ultrasound 

## 2016-04-25 NOTE — Discharge Instructions (Signed)

## 2016-04-25 NOTE — ED Triage Notes (Signed)
Patient to ER for c/o right upper abd pain. States she has had same issue for last couple of months, but much worse last 3 days. +N/V/D. States pain is much more severe after eating anything.

## 2016-04-25 NOTE — ED Provider Notes (Signed)
[facility] Emergency Department Provider Note    First MD Initiated Contact with Patient 04/25/16 1247     (approximate)  I have reviewed the triage vital signs and the nursing notes.   HISTORY  Chief Complaint Abdominal Pain    HPI Sandy [name] is a 26 y.o. female presents with right upper quadrant pain and epigastric pain that has been intermittent for the past several months. States that it became acutely worse over the past 3 days. States that the pain is worse with eating. States that she has been having nausea vomiting and diarrhea. No blood in her vomit. No blood in her stools. No fevers. States the pain after eating is 1010 in severity but currently is 4 out of 10 in severity. Denies any radiation, chest pain or shortness of breath.   Past Medical History:  Diagnosis Date  . Bicornate uterus   . Family history of adverse reaction to anesthesia    uncle has a hard time waking up  . Genital HSV   . Obesity affecting pregnancy    BMI>40   Family History  Problem Relation Age of Onset  . Diabetes Maternal Aunt   . Hyperlipidemia Maternal Aunt    Past Surgical History:  Procedure Laterality Date  . CESAREAN SECTION     for HSV outbreak  . CESAREAN SECTION N/A 11/29/2015   Procedure: CESAREAN SECTION Baby Girl 7lb.[name];  Surgeon: Vena Austria, MD;  Location: [facility] ORS;  Service: Obstetrics;  Laterality: N/A;  . ears Bilateral    tubes  . TONSILLECTOMY     Patient Active Problem List   Diagnosis Date Noted  . H/O: cesarean section 11/29/2015  . Labor and delivery, indication for care 10/16/2015  . Back pain 09/27/2015  . UTI (lower urinary tract infection) 09/27/2015  . Abdominal pain affecting pregnancy 09/19/2015  . Vaginal discharge 08/08/2015  . Vaginal bleeding in pregnancy 08/08/2015  . Traumatic injury during pregnancy in second trimester 08/07/2015      Prior to Admission medications   Medication Sig Start Date End  Date Taking? Authorizing Provider  dicyclomine (BENTYL) 10 MG capsule Take 1 capsule (10 mg total) by mouth 3 (three) times daily as needed for spasms. 04/25/16 05/09/16  Willy Eddy, MD  HYDROmorphone (DILAUDID) 2 MG tablet Take 1-2 tablets (2-4 mg total) by mouth every 4 (four) hours as needed for moderate pain or severe pain. Patient not taking: Reported on 04/25/2016 12/01/15   Marta Antu, CNM  ibuprofen (ADVIL,MOTRIN) 600 MG tablet Take 1 tablet (600 mg total) by mouth every 6 (six) hours. Patient not taking: Reported on 04/25/2016 12/01/15   Marta Antu, CNM  promethazine (PHENERGAN) 12.5 MG tablet Take 1 tablet (12.5 mg total) by mouth every 6 (six) hours as needed for nausea or vomiting. 04/25/16   Willy Eddy, MD  ranitidine (ZANTAC) 150 MG tablet Take 1 tablet (150 mg total) by mouth 2 (two) times daily. 04/25/16 05/09/16  Willy Eddy, MD    Allergies Percocet [oxycodone-acetaminophen]    Social History Social History  Substance Use Topics  . Smoking status: Never Smoker  . Smokeless tobacco: Never Used  . Alcohol use No    Review of Systems Patient denies headaches, rhinorrhea, blurry vision, numbness, shortness of breath, chest pain, edema, cough, abdominal pain, nausea, vomiting, diarrhea, dysuria, fevers, rashes or hallucinations unless otherwise stated above in HPI. ____________________________________________   PHYSICAL EXAM:  VITAL SIGNS: Vitals:   04/25/16 1226 04/25/16 1754  BP:  131/72 (!) 117/52  Pulse: (!) 118 100  Resp: 18 18  Temp: 97.4 F (36.3 C)     Constitutional: Alert and oriented. Well appearing and in no acute distress. Eyes: Conjunctivae are normal. PERRL. EOMI. Head: Atraumatic. Nose: No congestion/rhinnorhea. Mouth/Throat: Mucous membranes are moist.  Oropharynx non-erythematous. Neck: No stridor. Painless ROM. No cervical spine tenderness to palpation Hematological/Lymphatic/Immunilogical: No cervical  lymphadenopathy. Cardiovascular: Normal rate, regular rhythm. Grossly normal heart sounds.  Good peripheral circulation. Respiratory: Normal respiratory effort.  No retractions. Lungs CTAB. Gastrointestinal: Soft, no peritonitis, RUQ tenderness with mid epigastric ttp. No distention. No abdominal bruits. No CVA tenderness. Genitourinary:  Musculoskeletal: No lower extremity tenderness nor edema.  No joint effusions. Neurologic:  Normal speech and language. No gross focal neurologic deficits are appreciated. No gait instability. Skin:  Skin is warm, dry and intact. No rash noted. Psychiatric: Mood and affect are normal. Speech and behavior are normal.  ____________________________________________   LABS (all labs ordered are listed, but only abnormal results are displayed)  Results for orders placed or performed during the hospital encounter of 04/25/16 (from the past 24 hour(s))  Lipase, blood     Status: None   Collection Time: 04/25/16 12:36 PM  Result Value Ref Range   Lipase 18 11 - 51 U/L  Comprehensive metabolic panel     Status: Abnormal   Collection Time: 04/25/16 12:36 PM  Result Value Ref Range   Sodium 137 135 - 145 mmol/L   Potassium 4.6 3.5 - 5.1 mmol/L   Chloride 108 101 - 111 mmol/L   CO2 22 22 - 32 mmol/L   Glucose, Bld 88 65 - 99 mg/dL   BUN 13 6 - 20 mg/dL   Creatinine, Ser 4.540.70 0.44 - 1.00 mg/dL   Calcium 9.0 8.9 - 09.810.3 mg/dL   Total Protein 7.4 6.5 - 8.1 g/dL   Albumin 4.1 3.5 - 5.0 g/dL   AST 24 15 - 41 U/L   ALT 16 14 - 54 U/L   Alkaline Phosphatase 80 38 - 126 U/L   Total Bilirubin 2.8 (H) 0.3 - 1.2 mg/dL   GFR calc non Af Amer >60 >60 mL/min   GFR calc Af Amer >60 >60 mL/min   Anion gap 7 5 - 15  CBC     Status: Abnormal   Collection Time: 04/25/16 12:36 PM  Result Value Ref Range   WBC 8.2 3.6 - 11.0 K/uL   RBC 5.23 (H) 3.80 - 5.20 MIL/uL   Hemoglobin 14.8 12.0 - 16.0 g/dL   HCT 11.943.1 14.735.0 - 82.947.0 %   MCV 82.4 80.0 - 100.0 fL   MCH 28.4 26.0 -  34.0 pg   MCHC 34.4 32.0 - 36.0 g/dL   RDW 56.215.1 (H) 13.011.5 - 86.514.5 %   Platelets 172 150 - 440 K/uL  Urinalysis, Complete w Microscopic     Status: Abnormal   Collection Time: 04/25/16  4:35 PM  Result Value Ref Range   Color, Urine YELLOW (A) YELLOW   APPearance CLEAR (A) CLEAR   Specific Gravity, Urine 1.026 1.005 - 1.030   pH 5.0 5.0 - 8.0   Glucose, UA NEGATIVE NEGATIVE mg/dL   Hgb urine dipstick NEGATIVE NEGATIVE   Bilirubin Urine NEGATIVE NEGATIVE   Ketones, ur NEGATIVE NEGATIVE mg/dL   Protein, ur NEGATIVE NEGATIVE mg/dL   Nitrite NEGATIVE NEGATIVE   Leukocytes, UA NEGATIVE NEGATIVE   RBC / HPF 0-5 0 - 5 RBC/hpf   WBC, UA 0-5 0 - 5  WBC/hpf   Bacteria, UA NONE SEEN NONE SEEN   Squamous Epithelial / LPF 0-5 (A) NONE SEEN   Mucous PRESENT   Pregnancy, urine POC     Status: None   Collection Time: 04/25/16  4:44 PM  Result Value Ref Range   Preg Test, Ur NEGATIVE NEGATIVE   ____________________________________________  EKG____________________________________________  RADIOLOGY  I personally reviewed all radiographic images ordered to evaluate for the above acute complaints and reviewed radiology reports and findings.  These findings were personally discussed with the patient.  Please see medical record for radiology report.  ____________________________________________   PROCEDURES  Procedure(s) performed:  Procedures    Critical Care performed: no ____________________________________________   INITIAL IMPRESSION / ASSESSMENT AND PLAN / ED COURSE  Pertinent labs & imaging results that were available during my care of the patient were reviewed by me and considered in my medical decision making (see chart for details).  DDX: cholecystitis, cholelithiasis, gastritis, sbo, constipation, biliary colic  Lawren Sexson Dolin is a 26 y.o. who presents to the ED with epigastric and right upper quadrant pain associated with eating as described above. Patient is afebrile,  mildly tachycardic but otherwise well perfused. Her abdominal exam has some tenderness but no peritonitis. Blood work ordered to evaluate for acute process shows no evidence of leukocytosis. She does have a mildly elevated bilirubin but no transaminitis and no acidosis. Based on her pain will order a right upper quadrant ultrasound to evaluate for any biliary obstruction. Patient does have risk factors given multiple family members with cholecystitis and cholelithiasis. We'll provide IV fluids for dehydration.  Clinical Course as of Apr 25 2016  Wed Apr 25, 2016  1525 Patient reassessed to the pain has improved. Abdominal exam benign. Discussed results of ultrasound patient.  [PR]  1708 Ua clear.  U preg negative.    [PR]  1803 KUB without evidnece of obstructive process.  PAtient in no acute distress.  Possibly biliary colick.  Patient tolerating gingerale.  Will DC with antiemetics, pain medications and follow up with surgery.  [PR]    Clinical Course User Index [PR] Willy Eddy, MD     ____________________________________________   FINAL CLINICAL IMPRESSION(S) / ED DIAGNOSES  Final diagnoses:  RUQ pain  Acute epigastric pain  Serum total bilirubin elevated  Non-intractable vomiting with nausea, unspecified vomiting type      NEW MEDICATIONS STARTED DURING THIS VISIT:  New Prescriptions   DICYCLOMINE (BENTYL) 10 MG CAPSULE    Take 1 capsule (10 mg total) by mouth 3 (three) times daily as needed for spasms.   PROMETHAZINE (PHENERGAN) 12.5 MG TABLET    Take 1 tablet (12.5 mg total) by mouth every 6 (six) hours as needed for nausea or vomiting.   RANITIDINE (ZANTAC) 150 MG TABLET    Take 1 tablet (150 mg total) by mouth 2 (two) times daily.     Note:  This document was prepared using Dragon voice recognition software and may include unintentional dictation errors.    Willy Eddy, MD 04/25/16 2018

## 2016-05-01 ENCOUNTER — Other Ambulatory Visit: Payer: Self-pay

## 2016-05-01 ENCOUNTER — Encounter: Payer: Self-pay | Admitting: Gastroenterology

## 2016-05-01 ENCOUNTER — Ambulatory Visit (INDEPENDENT_AMBULATORY_CARE_PROVIDER_SITE_OTHER): Payer: Medicaid Other | Admitting: Gastroenterology

## 2016-05-01 VITALS — BP 114/59 | HR 82 | Temp 98.9°F | Ht 65.5 in | Wt 224.0 lb

## 2016-05-01 DIAGNOSIS — G8929 Other chronic pain: Secondary | ICD-10-CM | POA: Diagnosis not present

## 2016-05-01 DIAGNOSIS — R112 Nausea with vomiting, unspecified: Secondary | ICD-10-CM

## 2016-05-01 DIAGNOSIS — R1013 Epigastric pain: Secondary | ICD-10-CM

## 2016-05-01 MED ORDER — ONDANSETRON 4 MG PO TBDP: 4.0000 mg | ORAL_TABLET | Freq: Three times a day (TID) | ORAL | 1 refills | Status: DC | PRN

## 2016-05-01 NOTE — Progress Notes (Addendum)
Gastroenterology Consultation  Referring Provider:     Dr. Roxan Hockeyobinson Primary Care Physician:  No PCP Per Patient Primary Gastroenterologist:  Dr. Servando SnareWohl     Reason for Consultation:     Nausea and vomiting with abdominal pain        HPI:   Sandy [name] is a 26 y.o. y/o female referred for consultation & management of Nausea vomiting abdominal pain by Dr. Bonnetta BarryNo PCP Per Patient.  This patient comes in today after being seen in the emergency room for nausea vomiting abdominal pain.  The patient states she was given pain medication and she states it made her feel more nauseous.  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  She also reports that the abdominal pain is worse when she strains to have a bowel movement.  She does have nausea and vomiting when she eats and she reports that eating makes her pain worse.  The patient was also started on Zantac which she states has not helped her symptoms at all.  There is no report of any unexplained weight loss because the patient states she has not checked her weight.  There is no report of any black stools or bloody stools.  Past Medical History:  Diagnosis Date  . Bicornate uterus   . Family history of adverse reaction to anesthesia    uncle has a hard time waking up  . Genital HSV   . Obesity affecting pregnancy    BMI>40    Past Surgical History:  Procedure Laterality Date  . CESAREAN SECTION     for HSV outbreak  . CESAREAN SECTION N/A 11/29/2015   Procedure: CESAREAN SECTION Baby Girl 7lb.Jerrilyn Cairo, 6oz.;  Surgeon: Vena AustriaAndreas Staebler, MD;  Location: [facility] ORS;  Service: Obstetrics;  Laterality: N/A;  . ears Bilateral    tubes  . TONSILLECTOMY      Prior to Admission medications   Medication Sig Start Date End Date Taking? Authorizing Provider  ranitidine (ZANTAC)  150 MG tablet Take 1 tablet (150 mg total) by mouth 2 (two) times daily. 04/25/16 05/09/16 Yes Willy EddyPatrick Robinson, MD  acetaminophen (TYLENOL) 500 MG tablet Take by mouth.    Historical Provider, MD  dicyclomine (BENTYL) 10 MG capsule Take 1 capsule (10 mg total) by mouth 3 (three) times daily as needed for spasms. Patient not taking: Reported on 05/01/2016 04/25/16 05/09/16  Willy EddyPatrick Robinson, MD  HYDROmorphone (DILAUDID) 2 MG tablet Take 1-2 tablets (2-4 mg total) by mouth every 4 (four) hours as needed for moderate pain or severe pain. Patient not taking: Reported on 04/25/2016 12/01/15   Marta Antuamara Brothers, CNM  ibuprofen (ADVIL,MOTRIN) 600 MG tablet Take 1 tablet (600 mg total) by mouth every 6 (six) hours. Patient not taking: Reported on 04/25/2016 12/01/15   Marta Antuamara Brothers, CNM  IRON PO Take by mouth.    Historical Provider, MD  metFORMIN (GLUCOPHAGE) 500 MG tablet Take by mouth. 02/18/14   Historical Provider, MD  ondansetron (ZOFRAN ODT) 4 MG disintegrating tablet Take 1 tablet (4 mg total) by mouth every 8 (eight) hours as needed for nausea or vomiting. 05/01/16   Midge Miniumarren Deaisa Merida, MD  promethazine (PHENERGAN) 12.5 MG tablet Take 1 tablet (12.5 mg total) by mouth every 6 (six) hours as needed for nausea or vomiting. Patient  not taking: Reported on 05/01/2016 04/25/16   Willy Eddy, MD  tobramycin (TOBREX) 0.3 % ophthalmic solution PLACE 1 DROP INTO BOTH EYES EVERY 4 HOURS 02/12/16   Historical Provider, MD    Family History  Problem Relation Age of Onset  . Diabetes Maternal Aunt   . Hyperlipidemia Maternal Aunt      Social History  Substance Use Topics  . Smoking status: Never Smoker  . Smokeless tobacco: Never Used  . Alcohol use No    Allergies as of 05/01/2016 - Review Complete 05/01/2016  Allergen Reaction Noted  . Oxycodone-acetaminophen Itching 09/17/2013  . Fluticasone Swelling 05/08/2014  . Percocet [oxycodone-acetaminophen] Itching 09/08/2014    Review of Systems:    All  systems reviewed and negative except where noted in HPI.   Physical Exam:  BP (!) 114/59   Pulse 82   Temp 98.9 F (37.2 C) (Oral)   Ht 5' 5.5" (1.664 m)   Wt 224 lb (101.6 kg)   LMP 04/03/2016 (Exact Date)   BMI 36.71 kg/m  Patient's last menstrual period was 04/03/2016 (exact date). Psych:  Alert and cooperative. Normal mood and affect. General:   Alert,  Well-developed, Morbidly obese, well-nourished, pleasant and cooperative in NAD Head:  Normocephalic and atraumatic. Eyes:  Sclera clear, no icterus.   Conjunctiva pink. Ears:  Normal auditory acuity. Nose:  No deformity, discharge, or lesions. Mouth:  No deformity or lesions,oropharynx pink & moist. Neck:  Supple; no masses or thyromegaly. Lungs:  Respirations even and unlabored.  Clear throughout to auscultation.   No wheezes, crackles, or rhonchi. No acute distress. Heart:  Regular rate and rhythm; no murmurs, clicks, rubs, or gallops. Abdomen:  Normal bowel sounds.  No bruits.  Soft, Positive tenderness to one finger palpation while raising the leg 6 inches above the exam table and non-distended without masses, hepatosplenomegaly or hernias noted.  No guarding or rebound tenderness.  Positive Carnett sign.   Rectal:  Deferred.  Msk:  Symmetrical without gross deformities.  Good, equal movement & strength bilaterally. Pulses:  Normal pulses noted. Extremities:  No clubbing or edema.  No cyanosis. Neurologic:  Alert and oriented x3;  grossly normal neurologically. Skin:  Intact without significant lesions or rashes.  No jaundice. Lymph Nodes:  No significant cervical adenopathy. Psych:  Alert and cooperative. Normal mood and affect.  Imaging Studies: Dg Abdomen 1 View  Result Date: 04/25/2016 CLINICAL DATA:  RIGHT upper quadrant pain, similar symptoms a few months ago, worse for 3 days. Nausea, vomiting and diarrhea. EXAM: ABDOMEN - 1 VIEW COMPARISON:  None. FINDINGS: The bowel gas pattern is normal. No radio-opaque calculi  or other significant radiographic abnormality are seen. Phleboliths project in the pelvis. IMPRESSION: Negative. Electronically Signed   By: Awilda Metro M.D.   On: 04/25/2016 17:59   US Abdomen Limited Ruq  Result Date: 04/25/2016 CLINICAL DATA:  Right upper quadrant pain for 1 week. EXAM: US ABDOMEN LIMITED - RIGHT UPPER QUADRANT COMPARISON:  None. FINDINGS: Gallbladder: No gallstones or wall thickening visualized. No sonographic Murphy sign noted by sonographer. Common bile duct: Diameter: 4 mm, within normal limits. Liver: No focal lesion identified. Within normal limits in parenchymal echogenicity. IMPRESSION: Negative.  No evidence of gallstones or biliary dilatation. Electronically Signed   By: Myles Rosenthal M.D.   On: 04/25/2016 14:07    Assessment and Plan:   Sandy [name] is a 26 y.o. y/o female who comes in for follow-up after being seen in the emergency room for  abdominal pain with nausea vomiting and dyspepsia.  The patient's abdominal pain is clearly musculoskeletal and reproducible with one finger palpation while flexing the abdominal wall muscles.  The patient does have nausea and vomiting for which she will be set up for an upper endoscopy. She will also be given samples of Dexilant to replace her Zantac and she will also be given Zofran to replace her Phenergan. I have discussed risks & benefits which include, but are not limited to, bleeding, infection, perforation & drug reaction.  The patient agrees with this plan & written consent will be obtained.       Midge Minium, MD. Clementeen Graham   Note: This dictation was prepared with Dragon dictation along with smaller phrase technology. Any transcriptional errors that result from this process are unintentional.

## 2016-05-02 ENCOUNTER — Encounter: Payer: Self-pay | Admitting: *Deleted

## 2016-05-03 NOTE — Discharge Instructions (Signed)

## 2016-05-04 ENCOUNTER — Ambulatory Visit: Payer: Medicaid Other | Admitting: Anesthesiology

## 2016-05-04 ENCOUNTER — Ambulatory Visit
Admission: RE | Admit: 2016-05-04 | Discharge: 2016-05-04 | Disposition: A | Discharge status: Home / Self Care | Payer: Medicaid Other | Source: Ambulatory Visit | Attending: Gastroenterology | Admitting: Gastroenterology

## 2016-05-04 ENCOUNTER — Encounter
Admission: RE | Disposition: A | Discharge status: Home / Self Care | Payer: Self-pay | Source: Ambulatory Visit | Attending: Gastroenterology

## 2016-05-04 DIAGNOSIS — Z79899 Other long term (current) drug therapy: Secondary | ICD-10-CM | POA: Insufficient documentation

## 2016-05-04 DIAGNOSIS — Z87891 Personal history of nicotine dependence: Secondary | ICD-10-CM | POA: Insufficient documentation

## 2016-05-04 DIAGNOSIS — K2950 Unspecified chronic gastritis without bleeding: Secondary | ICD-10-CM | POA: Insufficient documentation

## 2016-05-04 DIAGNOSIS — K219 Gastro-esophageal reflux disease without esophagitis: Secondary | ICD-10-CM | POA: Insufficient documentation

## 2016-05-04 DIAGNOSIS — R1013 Epigastric pain: Secondary | ICD-10-CM | POA: Diagnosis not present

## 2016-05-04 DIAGNOSIS — R112 Nausea with vomiting, unspecified: Secondary | ICD-10-CM | POA: Diagnosis not present

## 2016-05-04 DIAGNOSIS — K3189 Other diseases of stomach and duodenum: Secondary | ICD-10-CM | POA: Diagnosis not present

## 2016-05-04 HISTORY — DX: Gastro-esophageal reflux disease without esophagitis: K21.9

## 2016-05-04 HISTORY — DX: Adverse effect of unspecified anesthetic, initial encounter: T41.45XA

## 2016-05-04 HISTORY — PX: ESOPHAGOGASTRODUODENOSCOPY (EGD) WITH PROPOFOL: SHX5813

## 2016-05-04 HISTORY — DX: Other complications of anesthesia, initial encounter: T88.59XA

## 2016-05-04 SURGERY — ESOPHAGOGASTRODUODENOSCOPY (EGD) WITH PROPOFOL
Anesthesia: Monitor Anesthesia Care | Wound class: Clean Contaminated

## 2016-05-04 MED ORDER — LACTATED RINGERS IV SOLN
INTRAVENOUS | Status: DC
  Administered 2016-05-04: 11:00:00 via INTRAVENOUS

## 2016-05-04 MED ORDER — GLYCOPYRROLATE 0.2 MG/ML IJ SOLN
INTRAMUSCULAR | Status: DC | PRN
  Administered 2016-05-04: 0.1 mg via INTRAVENOUS

## 2016-05-04 MED ORDER — PROPOFOL 10 MG/ML IV BOLUS
INTRAVENOUS | Status: DC | PRN
  Administered 2016-05-04: 150 mg via INTRAVENOUS
  Administered 2016-05-04: 50 mg via INTRAVENOUS

## 2016-05-04 MED ORDER — STERILE WATER FOR IRRIGATION IR SOLN
Status: DC | PRN
  Administered 2016-05-04: 11:00:00

## 2016-05-04 MED ORDER — LIDOCAINE HCL (CARDIAC) 20 MG/ML IV SOLN
INTRAVENOUS | Status: DC | PRN
  Administered 2016-05-04: 50 mg via INTRAVENOUS

## 2016-05-04 SURGICAL SUPPLY — 32 items

## 2016-05-04 NOTE — Transfer of Care (Signed)
Immediate Anesthesia Transfer of Care Note  Patient: Sandy [name]  Procedure(s) Performed: Procedure(s): ESOPHAGOGASTRODUODENOSCOPY (EGD) WITH PROPOFOL (N/A)  Patient Location: PACU  Anesthesia Type: MAC  Level of Consciousness: awake, alert  and patient cooperative  Airway and Oxygen Therapy: Patient Spontanous Breathing and Patient connected to supplemental oxygen  Post-op Assessment: Post-op Vital signs reviewed, Patient's Cardiovascular Status Stable, Respiratory Function Stable, Patent Airway and No signs of Nausea or vomiting  Post-op Vital Signs: Reviewed and stable  Complications: No apparent anesthesia complications

## 2016-05-04 NOTE — Op Note (Signed)
[facility] Gastroenterology Patient Name: Sandy [name] Procedure Date: 05/04/2016 11:09 AM MRN: [id number] Account #: [id number] Date of Birth: [birth year] Admit Type: Outpatient Age: 26 Room: [facility] OR ROOM 01 Gender: Female Note Status: Finalized Procedure:            Upper GI endoscopy Indications:          Epigastric abdominal pain, Nausea with vomiting Providers:            Midge Minium MD, MD Medicines:            Propofol per Anesthesia Complications:        No immediate complications. Procedure:            Pre-Anesthesia Assessment:                       - Prior to the procedure, a History and Physical was                        performed, and patient medications and allergies were                        reviewed. The patient's tolerance of previous                        anesthesia was also reviewed. The risks and benefits of                        the procedure and the sedation options and risks were                        discussed with the patient. All questions were                        answered, and informed consent was obtained. Prior                        Anticoagulants: The patient has taken no previous                        anticoagulant or antiplatelet agents. ASA Grade                        Assessment: II - A patient with mild systemic disease.                        After reviewing the risks and benefits, the patient was                        deemed in satisfactory condition to undergo the                        procedure.                       After obtaining informed consent, the endoscope was                        passed under direct vision. Throughout the procedure,                        the  patient's blood pressure, pulse, and oxygen                        saturations were monitored continuously. The Olympus                        GIF H180J Endoscope (M#:[id number](S#:[id number]) was introduced through                        the mouth, and advanced  to the second part of duodenum.                        The upper GI endoscopy was accomplished without                        difficulty. The patient tolerated the procedure well. Findings:      The examined esophagus was normal.      Localized mildly erythematous mucosa without bleeding was found in the       gastric antrum. Biopsies were taken with a cold forceps for histology.      The examined duodenum was normal. Impression:           - Normal esophagus.                       - Erythematous mucosa in the antrum. Biopsied.                       - Normal examined duodenum. Recommendation:       - Await pathology results.                       - Discharge patient to home.                       - Resume previous diet.                       - Continue present medications.                       - Await pathology results. Procedure Code(s):    --- Professional ---                       [phone], Esophagogastroduodenoscopy, flexible, transoral;                        with biopsy, single or multiple Diagnosis Code(s):    --- Professional ---                       R11.2, Nausea with vomiting, unspecified                       K31.89, Other diseases of stomach and duodenum                       R10.13, Epigastric pain CPT copyright 2016 American Medical Association. All rights reserved. The codes documented in this report are preliminary and upon coder review may  be revised to meet current compliance requirements. Midge Miniumarren Hayslee Casebolt MD, MD 05/04/2016 11:24:19 AM This report has been signed electronically. Number of Addenda: 0 Note Initiated On: 05/04/2016 11:09  [facility]

## 2016-05-04 NOTE — Anesthesia Procedure Notes (Signed)
Procedure Name: MAC Date/Time: 05/04/2016 11:14 AM Performed by: Cameron Ali Pre-anesthesia Checklist: Patient identified, Emergency Drugs available, Suction available, Timeout performed and Patient being monitored Patient Re-evaluated:Patient Re-evaluated prior to inductionOxygen Delivery Method: Nasal cannula Placement Confirmation: positive ETCO2

## 2016-05-04 NOTE — Anesthesia Preprocedure Evaluation (Signed)
Anesthesia Evaluation  Patient identified by MRN, date of birth, ID band Patient awake    Reviewed: Allergy & Precautions, H&P , NPO status , Patient's Chart, lab work & pertinent test results, reviewed documented beta blocker date and time   Airway Mallampati: II  TM Distance: >3 FB Neck ROM: full    Dental no notable dental hx.    Pulmonary former smoker,    Pulmonary exam normal breath sounds clear to auscultation       Cardiovascular Exercise Tolerance: Good negative cardio ROS   Rhythm:regular Rate:Normal     Neuro/Psych negative neurological ROS  negative psych ROS   GI/Hepatic Neg liver ROS, GERD  ,  Endo/Other  negative endocrine ROS  Renal/GU negative Renal ROS  negative genitourinary   Musculoskeletal   Abdominal   Peds  Hematology negative hematology ROS (+)   Anesthesia Other Findings   Reproductive/Obstetrics negative OB ROS                             Anesthesia Physical Anesthesia Plan  ASA: II  Anesthesia Plan: MAC   Post-op Pain Management:    Induction:   Airway Management Planned:   Additional Equipment:   Intra-op Plan:   Post-operative Plan:   Informed Consent: I have reviewed the patients History and Physical, chart, labs and discussed the procedure including the risks, benefits and alternatives for the proposed anesthesia with the patient or authorized representative who has indicated his/her understanding and acceptance.   Dental Advisory Given  Plan Discussed with: CRNA  Anesthesia Plan Comments:         Anesthesia Quick Evaluation

## 2016-05-04 NOTE — H&P (Signed)
  Sandy [name] Leaf, MD [facility] [address] [place], [place] [address] Phone: [phone] Fax : [phone]  Primary Care Physician:  No PCP Per Patient Primary Gastroenterologist:  Dr. Servando SnareWohl  Pre-Procedure History & Physical: HPI:  Sandy [name] is a 26 y.o. female is here for an endoscopy.   Past Medical History:  Diagnosis Date  . Bicornate uterus   . Complication of anesthesia    itching after C-Sections  . Family history of adverse reaction to anesthesia    uncle has a hard time waking up  . Genital HSV   . GERD (gastroesophageal reflux disease)   . Obesity affecting pregnancy    BMI>40    Past Surgical History:  Procedure Laterality Date  . CESAREAN SECTION     for HSV outbreak  . CESAREAN SECTION N/A 11/29/2015   Procedure: CESAREAN SECTION Baby Girl 7lb.Jerrilyn Cairo, 6oz.;  Surgeon: Vena AustriaAndreas Staebler, MD;  Location: [facility] ORS;  Service: Obstetrics;  Laterality: N/A;  . ears Bilateral    tubes  . TONSILLECTOMY      Prior to Admission medications   Medication Sig Start Date End Date Taking? Authorizing Provider  dexlansoprazole (DEXILANT) 60 MG capsule Take 60 mg by mouth daily.   Yes Historical Provider, MD  ondansetron (ZOFRAN ODT) 4 MG disintegrating tablet Take 1 tablet (4 mg total) by mouth every 8 (eight) hours as needed for nausea or vomiting. 05/01/16   Sandy [name] Sandy Delone, MD  tobramycin (TOBREX) 0.3 % ophthalmic solution PLACE 1 DROP INTO BOTH EYES EVERY 4 HOURS 02/12/16   Historical Provider, MD    Allergies as of 05/01/2016 - Review Complete 05/01/2016  Allergen Reaction Noted  . Oxycodone-acetaminophen Itching 09/17/2013  . Fluticasone Swelling 05/08/2014  . Percocet [oxycodone-acetaminophen] Itching 09/08/2014    Family History  Problem Relation Age of Onset  . Diabetes Maternal Aunt   . Hyperlipidemia Maternal Aunt     Social History   Social History  . Marital status: Legally Separated    Spouse name: N/A  . Number of children: N/A  . Years of education:  N/A   Occupational History  . Not on file.   Social History Main Topics  . Smoking status: Former Smoker    Quit date: 04/09/2013  . Smokeless tobacco: Never Used  . Alcohol use Yes     Comment: 2x/month  . Drug use: No  . Sexual activity: Yes   Other Topics Concern  . Not on file   Social History Narrative  . No narrative on file    Review of Systems: See HPI, otherwise negative ROS  Physical Exam: Ht 5' 5.5" (1.664 m)   Wt 224 lb (101.6 kg)   LMP 04/03/2016   Breastfeeding? Unknown Comment: preg test neg  BMI 36.71 kg/m  General:   Alert,  pleasant and cooperative in NAD Head:  Normocephalic and atraumatic. Neck:  Supple; no masses or thyromegaly. Lungs:  Clear throughout to auscultation.    Heart:  Regular rate and rhythm. Abdomen:  Soft, nontender and nondistended. Normal bowel sounds, without guarding, and without rebound.   Neurologic:  Alert and  oriented x4;  grossly normal neurologically.  Impression/Plan: Sandy [name] is here for an endoscopy to be performed for Nausea and voimiting  Risks, benefits, limitations, and alternatives regarding  endoscopy have been reviewed with the patient.  Questions have been answered.  All parties agreeable.   Sandy [name] Sandy Klecka, MD  05/04/2016, 9:34 AM

## 2016-05-04 NOTE — Anesthesia Postprocedure Evaluation (Signed)
Anesthesia Post Note  Patient: Sandy [name]  Procedure(s) Performed: Procedure(s) (LRB): ESOPHAGOGASTRODUODENOSCOPY (EGD) WITH PROPOFOL (N/A)  Patient location during evaluation: PACU Anesthesia Type: MAC Level of consciousness: awake and alert Pain management: pain level controlled Vital Signs Assessment: post-procedure vital signs reviewed and stable Respiratory status: spontaneous breathing, nonlabored ventilation, respiratory function stable and patient connected to nasal cannula oxygen Cardiovascular status: stable and blood pressure returned to baseline Anesthetic complications: no    Alisa Graff

## 2016-05-07 ENCOUNTER — Encounter: Payer: Self-pay | Admitting: Gastroenterology

## 2016-05-08 ENCOUNTER — Encounter: Payer: Self-pay | Admitting: Gastroenterology

## 2016-05-09 ENCOUNTER — Encounter: Payer: Self-pay | Admitting: Gastroenterology

## 2016-06-04 DIAGNOSIS — O99213 Obesity complicating pregnancy, third trimester: Secondary | ICD-10-CM | POA: Insufficient documentation

## 2016-06-15 ENCOUNTER — Encounter: Payer: Medicaid Other | Admitting: Obstetrics & Gynecology

## 2016-08-13 ENCOUNTER — Encounter: Payer: Medicaid Other | Admitting: Advanced Practice Midwife

## 2016-09-13 ENCOUNTER — Observation Stay: Payer: Medicaid Other

## 2016-09-13 ENCOUNTER — Observation Stay
Admission: EM | Admit: 2016-09-13 | Discharge: 2016-09-13 | Payer: Medicaid Other | Attending: Obstetrics and Gynecology | Admitting: Obstetrics and Gynecology

## 2016-09-13 ENCOUNTER — Encounter: Payer: Self-pay | Admitting: *Deleted

## 2016-09-13 DIAGNOSIS — O4290 Premature rupture of membranes, unspecified as to length of time between rupture and onset of labor, unspecified weeks of gestation: Secondary | ICD-10-CM | POA: Diagnosis present

## 2016-09-13 DIAGNOSIS — O4702 False labor before 37 completed weeks of gestation, second trimester: Principal | ICD-10-CM | POA: Insufficient documentation

## 2016-09-13 DIAGNOSIS — Z3A23 23 weeks gestation of pregnancy: Secondary | ICD-10-CM | POA: Diagnosis not present

## 2016-09-13 DIAGNOSIS — O4700 False labor before 37 completed weeks of gestation, unspecified trimester: Secondary | ICD-10-CM | POA: Insufficient documentation

## 2016-09-13 LAB — CBC WITH DIFFERENTIAL/PLATELET
BASOS ABS: 0 10*3/uL (ref 0–0.1)
Basophils Relative: 0 %
EOS PCT: 1 %
Eosinophils Absolute: 0.1 10*3/uL (ref 0–0.7)
HEMATOCRIT: 38.4 % (ref 35.0–47.0)
Hemoglobin: 13.4 g/dL (ref 12.0–16.0)
LYMPHS ABS: 1.7 10*3/uL (ref 1.0–3.6)
LYMPHS PCT: 19 %
MCH: 29.7 pg (ref 26.0–34.0)
MCHC: 35 g/dL (ref 32.0–36.0)
MCV: 85 fL (ref 80.0–100.0)
MONO ABS: 0.3 10*3/uL (ref 0.2–0.9)
MONOS PCT: 4 %
Neutro Abs: 6.7 10*3/uL — ABNORMAL HIGH (ref 1.4–6.5)
Neutrophils Relative %: 76 %
Platelets: 156 10*3/uL (ref 150–440)
RBC: 4.52 MIL/uL (ref 3.80–5.20)
RDW: 15.3 % — AB (ref 11.5–14.5)
WBC: 8.9 10*3/uL (ref 3.6–11.0)

## 2016-09-13 LAB — URINE DRUG SCREEN, QUALITATIVE (ARMC ONLY)
AMPHETAMINES, UR SCREEN: NOT DETECTED
BENZODIAZEPINE, UR SCRN: NOT DETECTED
Barbiturates, Ur Screen: POSITIVE — AB
Cannabinoid 50 Ng, Ur ~~LOC~~: NOT DETECTED
Cocaine Metabolite,Ur ~~LOC~~: NOT DETECTED
MDMA (Ecstasy)Ur Screen: NOT DETECTED
Methadone Scn, Ur: NOT DETECTED
Opiate, Ur Screen: NOT DETECTED
PHENCYCLIDINE (PCP) UR S: NOT DETECTED
TRICYCLIC, UR SCREEN: NOT DETECTED

## 2016-09-13 LAB — TYPE AND SCREEN
ABO/RH(D): A POS
Antibody Screen: NEGATIVE

## 2016-09-13 LAB — URINALYSIS, ROUTINE W REFLEX MICROSCOPIC
Bacteria, UA: NONE SEEN
Bilirubin Urine: NEGATIVE
GLUCOSE, UA: NEGATIVE mg/dL
KETONES UR: NEGATIVE mg/dL
Nitrite: NEGATIVE
PROTEIN: NEGATIVE mg/dL
Specific Gravity, Urine: 1.016 (ref 1.005–1.030)
pH: 7 (ref 5.0–8.0)

## 2016-09-13 LAB — CHLAMYDIA/NGC RT PCR (ARMC ONLY)
CHLAMYDIA TR: NOT DETECTED
N gonorrhoeae: NOT DETECTED

## 2016-09-13 LAB — PREGNANCY, URINE: PREG TEST UR: POSITIVE — AB

## 2016-09-13 MED ORDER — MAGNESIUM SULFATE 50 % IJ SOLN: 2.0000 g/h | INTRAVENOUS | Status: DC

## 2016-09-13 MED ORDER — LACTATED RINGERS IV SOLN
INTRAVENOUS | Status: AC
  Filled 2016-09-13: qty 80

## 2016-09-13 MED ORDER — NIFEDIPINE 10 MG PO CAPS
20.0000 mg | ORAL_CAPSULE | Freq: Once | ORAL | Status: AC
  Administered 2016-09-13: 20 mg via ORAL
  Filled 2016-09-13: qty 2

## 2016-09-13 MED ORDER — LACTATED RINGERS IV BOLUS (SEPSIS)
1000.0000 mL | Freq: Once | INTRAVENOUS | Status: AC
  Administered 2016-09-13: 1000 mL via INTRAVENOUS

## 2016-09-13 MED ORDER — SODIUM CHLORIDE 0.9 % IV SOLN
2.0000 g | Freq: Once | INTRAVENOUS | Status: AC
  Administered 2016-09-13: 2 g via INTRAVENOUS
  Filled 2016-09-13: qty 2000

## 2016-09-13 MED ORDER — BETAMETHASONE SOD PHOS & ACET 6 (3-3) MG/ML IJ SUSP
INTRAMUSCULAR | Status: AC
  Administered 2016-09-13: 12 mg via INTRAMUSCULAR
  Filled 2016-09-13: qty 1

## 2016-09-13 MED ORDER — MAGNESIUM SULFATE BOLUS VIA INFUSION
4.0000 g | Freq: Once | INTRAVENOUS | Status: AC
  Administered 2016-09-13: 4 g via INTRAVENOUS
  Filled 2016-09-13: qty 500

## 2016-09-13 MED ORDER — MAGNESIUM SULFATE 4 GM/100ML IV SOLN
INTRAVENOUS | Status: AC
  Filled 2016-09-13: qty 100

## 2016-09-13 MED ORDER — BETAMETHASONE SOD PHOS & ACET 6 (3-3) MG/ML IJ SUSP
12.0000 mg | INTRAMUSCULAR | Status: DC
  Administered 2016-09-13: 12 mg via INTRAMUSCULAR

## 2016-09-13 MED ORDER — SODIUM CHLORIDE 0.9 % IV SOLN
INTRAVENOUS | Status: AC
  Administered 2016-09-13: 2 g via INTRAVENOUS
  Filled 2016-09-13: qty 2000

## 2016-09-13 MED ORDER — CALCIUM CARBONATE ANTACID 500 MG PO CHEW: 2.0000 | CHEWABLE_TABLET | ORAL | Status: DC | PRN

## 2016-09-13 MED ORDER — ACETAMINOPHEN 325 MG PO TABS: 650.0000 mg | ORAL_TABLET | ORAL | Status: DC | PRN

## 2016-09-13 MED ORDER — LACTATED RINGERS IV SOLN: INTRAVENOUS | Status: DC

## 2016-09-13 MED ORDER — CALCIUM GLUCONATE 10 % IV SOLN
INTRAVENOUS | Status: AC
  Filled 2016-09-13: qty 10

## 2016-09-13 NOTE — OB Triage Note (Signed)
Recvd to Obs 3 per wheelchair with c/o leaking fluid since 09/12/2016 at 0400 with fluid running down her legs.  Denies odor of urine.  Today the fluid appears pink.  Talked with her Dr's office.  Instructed to put a pad on and continue to assess.  Started cramping throughout the night with constant back pain.  Denies running fever.   Changed to gown and to bed.  EFM applied.  Plan of care discussed and oriented to room.  Agrees with plan and verbalized understanding.

## 2016-09-13 NOTE — Progress Notes (Signed)
[facility].

## 2016-09-13 NOTE — OB Triage Note (Signed)
     L&D OB Triage Note  SUBJECTIVE Sandy [name] is a 26 y.o. [phone] female at 1942w2d, EDD Estimated Date of Delivery: 01/08/17 who presents with c/o passing gush of fluid 30 hours ago and persistent vaginal discharge with some "pink" in it.  [facility]  She had an uncomplicated delivery 9 months ago with [facility]. Denies h/o STD or partner with history. .   Obstetric History   G4   P2   T2   P0   A0   L2    SAB0   TAB0   Ectopic0   Multiple0   Live Births2     # Outcome Date GA Lbr Len/2nd Weight Sex Delivery Anes PTL Lv  4 Current           3 Term 11/29/15 2251w0d  7 lb 6.2 oz (3.35 kg) F CS-LVertical Spinal  LIV     Name: Sandy [name]     Apgar1:  8                Apgar5: 9  2 Term 06/12/10    Sandy FerdinandM CS-LTranv   LIV     Name: Sandy [name]  1 Gravida               Prescriptions Prior to Admission  Medication Sig Dispense Refill Last Dose  . dexlansoprazole (DEXILANT) 60 MG capsule Take 60 mg by mouth daily.   05/03/2016 at Unknown time  . ondansetron (ZOFRAN ODT) 4 MG disintegrating tablet Take 1 tablet (4 mg total) by mouth every 8 (eight) hours as needed for nausea or vomiting. 20 tablet 1   . tobramycin (TOBREX) 0.3 % ophthalmic solution PLACE 1 DROP INTO BOTH EYES EVERY 4 HOURS  0 Not Taking at Unknown time   I personally saw and evaluated this patient.  DJE  OBJECTIVE  Nursing Evaluation:   BP 117/61 (BP Location: Left Arm)   Pulse 80   Temp 98.7 F (37.1 C) (Oral)   Resp 16   Ht 5\' 5"  (1.651 m)   Wt 230 lb (104.3 kg)   LMP 04/03/2016   BMI 38.27 kg/m    Findings:  Sterile speculum exam reveals thick puss-like vaginal discharge.       NTZ - Negative   Fern Negative     Cervix closed long by spec exam.  NST was performed and has been reviewed by me.  Positive fetal heart tones appropriate for gestational age.   Mode: External Baseline Rate (A): 155 bpm Variability: Moderate Accelerations: 10 x  10 Decelerations: None     Contraction Frequency (min): none  ASSESSMENT Impression:  1. Pregnancy:  [id number] at 3242w2d , EDD Estimated Date of Delivery: 01/08/17 2.  Strongly doubt ROM 3.  GC/CT pending  PLAN 1.   Check GC/CT - if negative consider U/S for AFI and fetal position

## 2016-09-13 NOTE — Discharge Summary (Signed)
Patient ID: Sandy [name], female   DOB: [birth year], 26 y.o.   MRN: [id number]    L&D OB Triage Note  SUBJECTIVE Sandy [name] is a 26 y.o. [id number] female at [redacted]w[redacted]d, EDD Estimated Date of Delivery: 01/08/17 who presented to triage with complaints of back pain, vaginal leakage for 2 days.   Obstetric History   G4   P2   T2   P0   A0   L2    SAB0   TAB0   Ectopic0   Multiple0   Live Births2     # Outcome Date GA Lbr Len/2nd Weight Sex Delivery Anes PTL Lv  4 Current           3 Term 11/29/15 3339w0d  7 lb 6.2 oz (3.35 kg) F CS-LVertical Spinal  LIV     Name: Sandy [name]     Apgar1:  8                Apgar5: 9  2 Term 06/12/10    Sandy [name] CS-LTranv   LIV     Name: Sandy [name]  1 Gravida               Prescriptions Prior to Admission  Medication Sig Dispense Refill Last Dose  . dexlansoprazole (DEXILANT) 60 MG capsule Take 60 mg by mouth daily.   05/03/2016 at Unknown time  . ondansetron (ZOFRAN ODT) 4 MG disintegrating tablet Take 1 tablet (4 mg total) by mouth every 8 (eight) hours as needed for nausea or vomiting. 20 tablet 1   . tobramycin (TOBREX) 0.3 % ophthalmic solution PLACE 1 DROP INTO BOTH EYES EVERY 4 HOURS  0 Not Taking at Unknown time     OBJECTIVE  Nursing Evaluation:   BP 127/68   Pulse 87   Temp 98.7 F (37.1 C) (Oral)   Resp 16   Ht 5\' 5"  (1.651 m)   Wt 230 lb (104.3 kg)   LMP 04/03/2016   BMI 38.27 kg/m    Findings:  Sterile spec exam close cervix     No pooling  Neg fern neg ntz     GC/CT negative     Sheets of WBCs on wet prep     Purulent discharge  U/S = footling breech            Subjectively normal fluid.   Mode: External Baseline Rate (A): 155 bpm Variability: Moderate Accelerations: None - gestational age appropriate Decelerations: Variable     Contraction Frequency (min): 2-3 - pt feeling as back pain  Failed IV hydration and procardia  ASSESSMENT Impression:  1. Pregnancy:  [id number] at [redacted]w[redacted]d , EDD Estimated Date of Delivery: 01/08/17 2.  Regular contractions 3.  No evidence of ROM but still concerning picture  PLAN 1. Discussed with [facility]  Beta meth  Ampicillin  Magnesium 2. [facility]

## 2016-09-13 NOTE — Progress Notes (Signed)
Transport present

## 2016-09-14 ENCOUNTER — Telehealth: Payer: Self-pay | Admitting: Obstetrics & Gynecology

## 2016-09-14 LAB — RPR: RPR: NONREACTIVE

## 2016-09-14 NOTE — Telephone Encounter (Signed)
Pt is being discharge today from [facility]. Herbert SetaHeather is calling to set up an appointment for Patient to follow up care . Heather states Per Dr. Maryclare BeanHeine patient needs an follow up with in a week and patient willn't be release from [facility] until patient has a follow up schedule.Herbert SetaHeather is also aware patient has schedule with us twice and No showed for her appointments. Herbert SetaHeather is faxing over records from patients visit.We schedule patient for 09/21/16 with Dr. Tiburcio PeaHarris. Pending Dr. Tiburcio PeaHarris reviewing records on patient.

## 2016-09-15 LAB — CULTURE, BETA STREP (GROUP B ONLY)

## 2016-09-15 NOTE — Telephone Encounter (Signed)
We will see her if she comes

## 2016-09-21 ENCOUNTER — Encounter: Payer: Self-pay | Admitting: Obstetrics & Gynecology

## 2016-09-21 ENCOUNTER — Ambulatory Visit (INDEPENDENT_AMBULATORY_CARE_PROVIDER_SITE_OTHER): Payer: Medicaid Other | Admitting: Obstetrics & Gynecology

## 2016-09-21 VITALS — BP 110/70 | Wt 235.0 lb

## 2016-09-21 DIAGNOSIS — Z3A24 24 weeks gestation of pregnancy: Secondary | ICD-10-CM

## 2016-09-21 DIAGNOSIS — Z8751 Personal history of pre-term labor: Secondary | ICD-10-CM

## 2016-09-21 NOTE — Patient Instructions (Signed)

## 2016-09-21 NOTE — Progress Notes (Signed)
09/21/2016   Chief Complaint: Missed period  Transfer of Care Patient: yes  History of Present Illness: Sandy [name] is a 26 y.o. [id number] 10343w3d based on Patient's last menstrual period was 04/03/2016. with an Estimated Date of Delivery: 01/08/17, with the above CC.  [facility] due to change in residence/job.   PTL at 23 weeks this pregnancy treated with meds and BMZ x2.  [facility].  No fuirther sx's. Prior CS x2.    ROS: A 12-point review of systems was performed and negative, except as stated in the above HPI.  OBGYN History: As per HPI. OB History  Gravida Para Term Preterm AB Living  4 2 2     2   SAB TAB Ectopic Multiple Live Births        0 2    # Outcome Date GA Lbr Len/2nd Weight Sex Delivery Anes PTL Lv  4 Current           3 Term 11/29/15 3270w0d  7 lb 6.2 oz (3.35 kg) F CS-LVertical Spinal  LIV  2 Term 06/12/10    M CS-LTranv   LIV  1 Gravida               Any issues with any prior pregnancies: yes Any prior children are healthy, doing well, without any problems or issues: yes History of pap smears: No. Last pap smear 3 mos. Abnormal: no  History of STIs: No   Past Medical History: Past Medical History:  Diagnosis Date  . Bicornate uterus   . Complication of anesthesia    itching after C-Sections  . Family history of adverse reaction to anesthesia    uncle has a hard time waking up  . Genital HSV   . GERD (gastroesophageal reflux disease)   . Obesity affecting pregnancy    BMI>40    Past Surgical History: Past Surgical History:  Procedure Laterality Date  . CESAREAN SECTION     for HSV outbreak  . CESAREAN SECTION N/A 11/29/2015   Procedure: CESAREAN SECTION Baby Girl 7lb.Jerrilyn Cairo, 6oz.;  Surgeon: Vena AustriaAndreas Staebler, MD;  Location: [facility] ORS;  Service: Obstetrics;  Laterality: N/A;  . ears Bilateral    tubes  . ESOPHAGOGASTRODUODENOSCOPY (EGD) WITH PROPOFOL N/A 05/04/2016   Procedure: ESOPHAGOGASTRODUODENOSCOPY (EGD) WITH PROPOFOL;  Surgeon:  Midge Miniumarren Wohl, MD;  [facility];  Service: Endoscopy;  Laterality: N/A;  . TONSILLECTOMY      Family History:  Family History  Problem Relation Age of Onset  . Diabetes Maternal Aunt   . Hyperlipidemia Maternal Aunt    She denies any female cancers, bleeding or blood clotting disorders.  She denies any history of mental retardation, birth defects or genetic disorders in her or the FOB's history  Social History:  Social History   Social History  . Marital status: Legally Separated    Spouse name: N/A  . Number of children: N/A  . Years of education: N/A   Occupational History  . Not on file.   Social History Main Topics  . Smoking status: Former Smoker    Quit date: 04/09/2013  . Smokeless tobacco: Never Used  . Alcohol use Yes     Comment: 2x/month  . Drug use: No  . Sexual activity: Yes   Other Topics Concern  . Not on file   Social History Narrative  . No narrative on file   Any pets in the household: no  Allergy: Allergies  Allergen Reactions  .  Oxycodone-Acetaminophen Itching  . Fluticasone Swelling    Mouth swelling  . Percocet [Oxycodone-Acetaminophen] Itching  . Tape     Some tapes cause blisters    Current Outpatient Medications:  Current Outpatient Prescriptions:  .  butalbital-acetaminophen-caffeine (FIORICET, ESGIC) 50-325-40 MG tablet, butalbital-acetaminophen-caffeine 50 mg-325 mg-40 mg tablet  Take 1 tablet every 4 hours by oral route for 5 days., Disp: , Rfl:  .  dexlansoprazole (DEXILANT) 60 MG capsule, Take 60 mg by mouth daily., Disp: , Rfl:  .  ondansetron (ZOFRAN ODT) 4 MG disintegrating tablet, Take 1 tablet (4 mg total) by mouth every 8 (eight) hours as needed for nausea or vomiting., Disp: 20 tablet, Rfl: 1 .  tobramycin (TOBREX) 0.3 % ophthalmic solution, PLACE 1 DROP INTO BOTH EYES EVERY 4 HOURS, Disp: , Rfl: 0   Physical Exam:   BP 110/70   Wt 235 lb (106.6 kg)   LMP 04/03/2016   BMI 39.11 kg/m  Body mass  index is 39.11 kg/m. Constitutional: Well nourished, well developed female in no acute distress.  Neck:  Supple, normal appearance, and no thyromegaly  Cardiovascular: S1, S2 normal, no murmur, rub or gallop, regular rate and rhythm Respiratory:  Clear to auscultation bilateral. Normal respiratory effort Abdomen: positive bowel sounds and no masses, hernias; diffusely non tender to palpation, non distended Breasts: breasts appear normal, no suspicious masses, no skin or nipple changes or axillary nodes. Neuro/Psych:  Normal mood and affect.  Skin:  Warm and dry.  Lymphatic:  No inguinal lymphadenopathy.  FHT 140s  Assessment: Ms. Denise is a 26 y.o. [id number] [redacted]w[redacted]d based on Patient's last menstrual period was 04/03/2016. with an Estimated Date of Delivery: 01/08/17,  for prenatal care.  Plan:  1) Avoid alcoholic beverages. 2) Patient encouraged not to smoke.  3) Discontinue the use of all non-medicinal drugs and chemicals.  4) Take prenatal vitamins daily.  5) Seatbelt use advised 6) Nutrition, food safety (fish, cheese advisories, and high nitrite foods) and exercise discussed. 7) Hospital and practice style delivering at [facility] discussed  8) Patient is asked about travel to areas at risk for the Zika virus, and counseled to avoid travel and exposure to mosquitoes or sexual partners who may have themselves been exposed to the virus. Testing is discussed, and will be ordered as appropriate.  9) Childbirth classes at [facility] advised 10) Genetic Screening, such as with 1st Trimester Screening, cell free fetal DNA, AFP testing, and Ultrasound, as well as with amniocentesis and CVS as appropriate, is discussed with patient. She plans to have not genetic testing this pregnancy. 11) Monitor for PTL, counseled as to the risks and warning signs 12) CS planned 39 weeks 13) BC- Pill  Problem list reviewed and updated.  Annamarie Major, MD, [facility], [facility] 09/21/2016  3:33  PM

## 2016-10-05 ENCOUNTER — Ambulatory Visit (INDEPENDENT_AMBULATORY_CARE_PROVIDER_SITE_OTHER): Payer: Medicaid Other | Admitting: Advanced Practice Midwife

## 2016-10-05 VITALS — BP 130/80 | Wt 237.0 lb

## 2016-10-05 DIAGNOSIS — Z3A26 26 weeks gestation of pregnancy: Secondary | ICD-10-CM

## 2016-10-05 NOTE — Progress Notes (Signed)
Doing well. Has had some BH ctx. Nothing crampy or regular. No LOF, VB. Has appointment scheduled for 2 weeks with growth scan and 28 wk labs.

## 2016-10-18 ENCOUNTER — Other Ambulatory Visit: Payer: Self-pay | Admitting: Advanced Practice Midwife

## 2016-10-18 DIAGNOSIS — O09219 Supervision of pregnancy with history of pre-term labor, unspecified trimester: Principal | ICD-10-CM

## 2016-10-18 DIAGNOSIS — O09899 Supervision of other high risk pregnancies, unspecified trimester: Secondary | ICD-10-CM

## 2016-10-22 ENCOUNTER — Ambulatory Visit: Payer: Medicaid Other

## 2016-10-22 ENCOUNTER — Ambulatory Visit (INDEPENDENT_AMBULATORY_CARE_PROVIDER_SITE_OTHER): Payer: Medicaid Other | Admitting: Advanced Practice Midwife

## 2016-10-22 VITALS — BP 132/68 | Wt 239.0 lb

## 2016-10-22 DIAGNOSIS — Z8751 Personal history of pre-term labor: Secondary | ICD-10-CM

## 2016-10-22 DIAGNOSIS — Z3A28 28 weeks gestation of pregnancy: Secondary | ICD-10-CM

## 2016-10-22 DIAGNOSIS — O09899 Supervision of other high risk pregnancies, unspecified trimester: Secondary | ICD-10-CM

## 2016-10-22 DIAGNOSIS — O09219 Supervision of pregnancy with history of pre-term labor, unspecified trimester: Secondary | ICD-10-CM

## 2016-10-22 NOTE — Progress Notes (Signed)
Growth scan today 2# 5oz, 27% with AC 7%, Femur 5.5%, HC <2%. Discussed with Dr Tiburcio PeaHarris- recommended patient have repeat growth scan with UA dopplers at 32 weeks. Patient had a few painful ctx's yesterday and felt better after resting. Good fetal movement. 28 week labs today. No LOF, VB.

## 2016-10-22 NOTE — Progress Notes (Signed)
Growth U/S today 28 week labs Vaginal pressure/ctx

## 2016-10-23 ENCOUNTER — Ambulatory Visit (INDEPENDENT_AMBULATORY_CARE_PROVIDER_SITE_OTHER): Payer: Medicaid Other | Admitting: Obstetrics & Gynecology

## 2016-10-23 ENCOUNTER — Telehealth: Payer: Self-pay

## 2016-10-23 VITALS — BP 120/80 | Wt 239.0 lb

## 2016-10-23 DIAGNOSIS — O4690 Antepartum hemorrhage, unspecified, unspecified trimester: Secondary | ICD-10-CM

## 2016-10-23 DIAGNOSIS — N898 Other specified noninflammatory disorders of vagina: Secondary | ICD-10-CM | POA: Diagnosis not present

## 2016-10-23 NOTE — Progress Notes (Signed)
  HPI:      Ms. Reece LeaderJasmine M [name] is a 26 y.o. [id number] who LMP was Patient's last menstrual period was 04/03/2016., presents today for a problem visit.  She complains of d/c and bleeding at 29 weeks.  Sex 2 days ago.  No recent trauma or infection.  Vaginitis: Patient complains of an abnormal vaginal discharge for 1 day. Vaginal symptoms include abnormal bleeding: flow is light and discharge described as pink.Vulvar symptoms include none.STI Risk: Very low risk of STD exposureDischarge described as: scant and pink.Other associated symptoms: none.Menstrual pattern: She had been bleeding usually just w sex during this pregnancy. Contraception: currently pregnant  PMHx: She  has a past medical history of Bicornate uterus; Complication of anesthesia; Family history of adverse reaction to anesthesia; Genital HSV; GERD (gastroesophageal reflux disease); and Obesity affecting pregnancy. Also,  has a past surgical history that includes Cesarean section; Tonsillectomy; ears (Bilateral); Cesarean section (N/A, 11/29/2015); and Esophagogastroduodenoscopy (egd) with propofol (N/A, 05/04/2016)., family history includes Diabetes in her maternal aunt; Hyperlipidemia in her maternal aunt.,  reports that she quit smoking about 3 years ago. She has never used smokeless tobacco. She reports that she drinks alcohol. She reports that she does not use drugs.  She currently has no medications in their medication list. Also, is allergic to oxycodone-acetaminophen; fluticasone; percocet [oxycodone-acetaminophen]; and tape.  Review of Systems  All other systems reviewed and are negative.   Objective: BP 120/80   Wt 239 lb (108.4 kg)   LMP 04/03/2016   BMI 39.77 kg/m  Physical Exam  Constitutional: She is oriented to person, place, and time. She appears well-developed and well-nourished. No distress.  Genitourinary: Vagina normal and uterus normal. Pelvic exam was performed with patient supine. There is no rash, tenderness or  lesion on the right labia. There is no rash, tenderness or lesion on the left labia. No erythema or bleeding in the vagina. Right adnexum does not display mass and does not display tenderness. Left adnexum does not display mass and does not display tenderness. Cervix does not exhibit motion tenderness, discharge, polyp or nabothian cyst.   Uterus is mobile and midaxial. Uterus is not enlarged or exhibiting a mass.  Genitourinary Comments: Cervix soft not dilated Contact bleeding Scant d/c  Abdominal: Soft. She exhibits no distension. There is no tenderness.  FHT 140s  Musculoskeletal: Normal range of motion.  Neurological: She is alert and oriented to person, place, and time. No cranial nerve deficit.  Skin: Skin is warm and dry.  Psychiatric: She has a normal mood and affect.    ASSESSMENT/PLAN:    Problem List Items Addressed This Visit      Genitourinary   Vaginal bleeding during pregnancy   Relevant Orders   Fetal fibronectin   NuSwab Vaginitis Plus (VG+)     Other   Vaginal discharge - Primary   Relevant Orders   Fetal fibronectin   NuSwab Vaginitis Plus (VG+)    Pelvic rest, no sex, monitor for worsening sx's as we await tests  Annamarie MajorPaul Harris, MD, [facility]AC[facility], [facility] 10/23/2016  11:34 AM

## 2016-10-23 NOTE — Telephone Encounter (Signed)
Pt c/o slight pink d/c at 29wks.  No pain other than a really bad belly ache she woke up with this am.  Has not had IC and was not ck'd yesterday at appt. Adv per protocol appt at 10:40 c PH.

## 2016-10-24 LAB — 28 WEEK RH+PANEL
BASOS ABS: 0 10*3/uL (ref 0.0–0.2)
Basos: 1 %
EOS (ABSOLUTE): 0.1 10*3/uL (ref 0.0–0.4)
Eos: 2 %
Gestational Diabetes Screen: 126 mg/dL (ref 65–139)
HEMOGLOBIN: 11.7 g/dL (ref 11.1–15.9)
HIV SCREEN 4TH GENERATION: NONREACTIVE
Hematocrit: 34.4 % (ref 34.0–46.6)
Immature Grans (Abs): 0 10*3/uL (ref 0.0–0.1)
Immature Granulocytes: 1 %
LYMPHS ABS: 1.5 10*3/uL (ref 0.7–3.1)
Lymphs: 26 %
MCH: 29 pg (ref 26.6–33.0)
MCHC: 34 g/dL (ref 31.5–35.7)
MCV: 85 fL (ref 79–97)
MONOCYTES: 5 %
MONOS ABS: 0.3 10*3/uL (ref 0.1–0.9)
Neutrophils Absolute: 3.9 10*3/uL (ref 1.4–7.0)
Neutrophils: 65 %
PLATELETS: 149 10*3/uL — AB (ref 150–379)
RBC: 4.04 x10E6/uL (ref 3.77–5.28)
RDW: 15.2 % (ref 12.3–15.4)
RPR Ser Ql: NONREACTIVE
WBC: 5.9 10*3/uL (ref 3.4–10.8)

## 2016-10-24 LAB — FETAL FIBRONECTIN: FETAL FIBRONECTIN: NEGATIVE

## 2016-10-24 LAB — VARICELLA ZOSTER ANTIBODY, IGG: VARICELLA: 2316 {index} (ref >165)

## 2016-11-02 LAB — NUSWAB VAGINITIS PLUS (VG+)
ATOPOBIUM VAGINAE: HIGH {score} — AB
CANDIDA GLABRATA, NAA: NEGATIVE
Candida albicans, NAA: NEGATIVE
Chlamydia trachomatis, NAA: NEGATIVE
Megasphaera 1: HIGH Score — AB
NEISSERIA GONORRHOEAE, NAA: NEGATIVE
Trich vag by NAA: NEGATIVE

## 2016-11-06 ENCOUNTER — Ambulatory Visit (INDEPENDENT_AMBULATORY_CARE_PROVIDER_SITE_OTHER): Payer: Medicaid Other | Admitting: Obstetrics and Gynecology

## 2016-11-06 VITALS — BP 102/52 | Wt 241.0 lb

## 2016-11-06 DIAGNOSIS — O0993 Supervision of high risk pregnancy, unspecified, third trimester: Secondary | ICD-10-CM

## 2016-11-06 DIAGNOSIS — Z6841 Body Mass Index (BMI) 40.0 and over, adult: Secondary | ICD-10-CM

## 2016-11-06 DIAGNOSIS — Z3A31 31 weeks gestation of pregnancy: Secondary | ICD-10-CM

## 2016-11-06 DIAGNOSIS — O99213 Obesity complicating pregnancy, third trimester: Secondary | ICD-10-CM

## 2016-11-06 NOTE — Progress Notes (Signed)
TDAP n/v (out of stock)

## 2016-11-06 NOTE — Progress Notes (Signed)
Growth scan next visit

## 2016-11-20 DIAGNOSIS — Q513 Bicornate uterus: Secondary | ICD-10-CM | POA: Insufficient documentation

## 2016-11-21 ENCOUNTER — Ambulatory Visit (INDEPENDENT_AMBULATORY_CARE_PROVIDER_SITE_OTHER): Payer: Medicaid Other

## 2016-11-21 ENCOUNTER — Ambulatory Visit (INDEPENDENT_AMBULATORY_CARE_PROVIDER_SITE_OTHER): Payer: Medicaid Other | Admitting: Obstetrics and Gynecology

## 2016-11-21 VITALS — BP 118/76 | Wt 244.0 lb

## 2016-11-21 DIAGNOSIS — Z6841 Body Mass Index (BMI) 40.0 and over, adult: Secondary | ICD-10-CM

## 2016-11-21 DIAGNOSIS — O0993 Supervision of high risk pregnancy, unspecified, third trimester: Secondary | ICD-10-CM

## 2016-11-21 DIAGNOSIS — Z362 Encounter for other antenatal screening follow-up: Secondary | ICD-10-CM | POA: Diagnosis not present

## 2016-11-21 DIAGNOSIS — Z98891 History of uterine scar from previous surgery: Secondary | ICD-10-CM

## 2016-11-21 DIAGNOSIS — O99213 Obesity complicating pregnancy, third trimester: Secondary | ICD-10-CM

## 2016-11-21 DIAGNOSIS — Z3A31 31 weeks gestation of pregnancy: Secondary | ICD-10-CM

## 2016-11-21 DIAGNOSIS — Q513 Bicornate uterus: Secondary | ICD-10-CM

## 2016-11-21 DIAGNOSIS — Z8759 Personal history of other complications of pregnancy, childbirth and the puerperium: Secondary | ICD-10-CM

## 2016-11-21 DIAGNOSIS — B009 Herpesviral infection, unspecified: Secondary | ICD-10-CM

## 2016-11-21 DIAGNOSIS — Z8751 Personal history of pre-term labor: Secondary | ICD-10-CM

## 2016-11-21 NOTE — Progress Notes (Signed)
Routine Prenatal Care Visit  Subjective  Sandy [name] is a 26 y.o. [phone] at [redacted]w[redacted]d being seen today for ongoing prenatal care.  She is currently monitored for the following issues for this high-risk pregnancy and has Traumatic injury during pregnancy in second trimester; Vaginal discharge; Vaginal bleeding during pregnancy; Abdominal pain affecting pregnancy; Back pain; UTI (lower urinary tract infection); History of 2 cesarean sections; RLQ abdominal pain; Nausea and vomiting; Other diseases of stomach and duodenum; Abdominal pain, epigastric; Amniotic fluid leaking; History of preterm delivery; History of preterm labor; HSV-2 (herpes simplex virus 2) infection; Obesity complicating pregnancy, third trimester; Preterm uterine contractions, antepartum; Bicornuate uterus; and BMI 40.0-44.9, adult (HCC) on her problem list.  ----------------------------------------------------------------------------------- Patient reports no bleeding, no contractions and no leaking.   Contractions: Not present. Vag. Bleeding: None.  Movement: Present. Denies leaking of fluid.  Growth u/s today 18th%ile, AFI normal.   ----------------------------------------------------------------------------------- The following portions of the patient's history were reviewed and updated as appropriate: allergies, current medications, past family history, past medical history, past social history, past surgical history and problem list. Problem list updated.   Objective  Blood pressure 118/76, weight 244 lb (110.7 kg), last menstrual period 04/03/2016, unknown if currently breastfeeding. Pregravid weight Pregravid weight not on file Total Weight Gain Not found. Urinalysis: Urine Protein: Negative Urine Glucose: Negative  Fetal Status: Fetal Heart Rate (bpm): 140   Movement: Present     General:  Alert, oriented and cooperative. Patient is in no acute distress.  Skin: Skin is warm and dry. No rash noted.   Cardiovascular:  Normal heart rate noted  Respiratory: Normal respiratory effort, no problems with respiration noted  Abdomen: Soft, gravid, appropriate for gestational age. Pain/Pressure: Absent     Pelvic:  Cervical exam deferred        Extremities: Normal range of motion.     ental Status: Normal mood and affect. Normal behavior. Normal judgment and thought content.     Assessment   26 y.o. [id number] at [redacted]w[redacted]d by  01/08/2017, by Last Menstrual Period presenting for routine prenatal visit  Plan   Due Sept. 28/2018 Problems (from 09/13/16 to present)    Problem Noted Resolved   BMI 40.0-44.9, adult (HCC) 11/21/2016 by Conard Novak, MD No   Overview Signed 11/21/2016  3:56 PM by Conard Novak, MD    BMI >=40 [x]  early 1h gtt -  [x]  u/s for dating [ ]   [ ]  Growth u/s 28 [ ] , 32 [x] , 36 weeks [ ]  [ ]  NST/AFI weekly 36+ weeks (36[] , 37[] , 38[] , 39[] , 40[] ) [ ]  IOL by 41 weeks (scheduled, prn [] )       Obesity complicating pregnancy, third trimester 06/04/2016 by Tresea Mall, CNM No   Overview Signed 10/05/2016  3:18 PM by Tresea Mall, CNM    Overview:  Formatting of this note may be different from the original. Management of Obesity:  BMI 30-40 There is no height or weight on file to calculate BMI.  1st trimester Ordered:                                Result:                                       [ ]  early glucola   [ ]  HgbA1c   [ ]   AST/ALT   [ ]  TSH   [ ]  creatinine/urine p/c ratio   [ ]  1 mg folic acid   [ ]  Nutritional consultation   [ ]  1st trimester dating US   [ ]  81 mg ASA @ 12-16     2nd trimester Ordered:   [ ]  detailed anatomy US   [ ]  monitor maternal weight gain    3rd trimester Ordered: [ ]  repeat glucola @ 28 weeks   [ ]  consider serial growth scans if pannus precludes accurate fundal height (28 wks)   [ ]  consider antenatal testing:  weekly NST/AFI after 36 weeks   [ ]  delivery by EDD            Preterm labor symptoms and general obstetric  precautions including but not limited to vaginal bleeding, contractions, leaking of fluid and fetal movement were reviewed in detail with the patient. Please refer to After Visit Summary for other counseling recommendations.   Return in about 2 weeks (around 12/05/2016) for Routine Prenatal Appointment.  Thomasene MohairStephen Jackson, MD  11/21/2016 4:00 PM

## 2016-11-22 ENCOUNTER — Telehealth: Payer: Self-pay | Admitting: Obstetrics and Gynecology

## 2016-11-22 NOTE — Telephone Encounter (Signed)
Patient is aware of H[facility]P at [facility] on 12/31/16 @ 8:50am w/ Dr. Bonney AidStaebler, Pre-admit Testing afterwards, and OR on 01/01/17.

## 2016-11-22 NOTE — Telephone Encounter (Signed)
-----   Message from Conard NovakStephen D Jackson, MD sent at 11/21/2016  4:08 PM EDT ----- Regarding: Schedule c-section Surgery Booking Request Patient Full Name: Sandy [name]   MRN: [id number]  DOB: April 12, 1990  Surgeon: Thomasene MohairStephen Jackson, MD  Requested Surgery Date and Time: 01/01/17 Primary Diagnosis and Code: history of c-section, desires repeat Secondary Diagnosis and Code:  Surgical Procedure: Cesarean Section L&D Notification: Yes Admission Status: surgery admit Length of Surgery: 60 min Special Case Needs: none H&P: tbd (date) Phone Interview or Office Pre-Admit: pre admit Interpreter: n/a Language: eng Medical Clearance: n/a Special Scheduling Instructions: n/a

## 2016-11-25 ENCOUNTER — Observation Stay: Payer: Medicaid Other

## 2016-11-25 ENCOUNTER — Observation Stay
Admission: EM | Admit: 2016-11-25 | Discharge: 2016-11-26 | Disposition: A | Discharge status: Home / Self Care | Payer: Medicaid Other | Attending: Advanced Practice Midwife | Admitting: Advanced Practice Midwife

## 2016-11-25 ENCOUNTER — Encounter: Payer: Self-pay | Admitting: *Deleted

## 2016-11-25 DIAGNOSIS — O23593 Infection of other part of genital tract in pregnancy, third trimester: Secondary | ICD-10-CM | POA: Diagnosis not present

## 2016-11-25 DIAGNOSIS — Z87891 Personal history of nicotine dependence: Secondary | ICD-10-CM | POA: Diagnosis not present

## 2016-11-25 DIAGNOSIS — O4693 Antepartum hemorrhage, unspecified, third trimester: Principal | ICD-10-CM | POA: Insufficient documentation

## 2016-11-25 DIAGNOSIS — B9689 Other specified bacterial agents as the cause of diseases classified elsewhere: Secondary | ICD-10-CM | POA: Insufficient documentation

## 2016-11-25 DIAGNOSIS — O4690 Antepartum hemorrhage, unspecified, unspecified trimester: Secondary | ICD-10-CM | POA: Diagnosis present

## 2016-11-25 DIAGNOSIS — O99213 Obesity complicating pregnancy, third trimester: Secondary | ICD-10-CM | POA: Insufficient documentation

## 2016-11-25 DIAGNOSIS — Z3A34 34 weeks gestation of pregnancy: Secondary | ICD-10-CM

## 2016-11-25 DIAGNOSIS — Z6841 Body Mass Index (BMI) 40.0 and over, adult: Secondary | ICD-10-CM | POA: Diagnosis not present

## 2016-11-25 DIAGNOSIS — N939 Abnormal uterine and vaginal bleeding, unspecified: Secondary | ICD-10-CM

## 2016-11-25 LAB — FIBRINOGEN: Fibrinogen: 443 mg/dL (ref 210–475)

## 2016-11-25 LAB — CBC
HEMATOCRIT: 37.5 % (ref 35.0–47.0)
Hemoglobin: 13.2 g/dL (ref 12.0–16.0)
MCH: 30.3 pg (ref 26.0–34.0)
MCHC: 35.3 g/dL (ref 32.0–36.0)
MCV: 85.6 fL (ref 80.0–100.0)
PLATELETS: 141 10*3/uL — AB (ref 150–440)
RBC: 4.38 MIL/uL (ref 3.80–5.20)
RDW: 15.8 % — AB (ref 11.5–14.5)
WBC: 7.2 10*3/uL (ref 3.6–11.0)

## 2016-11-25 LAB — APTT: APTT: 28 s (ref 24–36)

## 2016-11-25 LAB — FIBRIN DERIVATIVES D-DIMER (ARMC ONLY): Fibrin derivatives D-dimer (ARMC): 567.74 — ABNORMAL HIGH (ref 0.00–499.00)

## 2016-11-25 LAB — PROTIME-INR
INR: 1.08
PROTHROMBIN TIME: 14 s (ref 11.4–15.2)

## 2016-11-25 MED ORDER — LACTATED RINGERS IV SOLN: 500.0000 mL | INTRAVENOUS | Status: DC | PRN

## 2016-11-25 MED ORDER — LACTATED RINGERS IV SOLN
INTRAVENOUS | Status: DC
  Administered 2016-11-25: 13:00:00 via INTRAVENOUS

## 2016-11-25 NOTE — Progress Notes (Signed)
L&D note   S: Still having bleeding when wiping.  O: BP 106/68   Pulse 81   Temp 98.3 F (36.8 C) (Oral)   Resp 16   Ht 5\' 5"  (1.651 m)   Wt 110.7 kg (244 lb)   LMP 03/31/2016   SpO2 99%   BMI 40.60 kg/m   General: in NAD  Results for orders placed or performed during the hospital encounter of 11/25/16 (from the past 24 hour(s))  Type and screen [facility]     Status: None (Preliminary result)   Collection Time: 11/25/16 12:54 PM  Result Value Ref Range   ABO/RH(D) PENDING    Antibody Screen PENDING    Sample Expiration 11/28/2016   CBC     Status: Abnormal   Collection Time: 11/25/16 12:54 PM  Result Value Ref Range   WBC 7.2 3.6 - 11.0 K/uL   RBC 4.38 3.80 - 5.20 MIL/uL   Hemoglobin 13.2 12.0 - 16.0 g/dL   HCT 16.1 09.6 - 04.5 %   MCV 85.6 80.0 - 100.0 fL   MCH 30.3 26.0 - 34.0 pg   MCHC 35.3 32.0 - 36.0 g/dL   RDW 40.9 (H) 81.1 - 91.4 %   Platelets 141 (L) 150 - 440 K/uL  Protime-INR     Status: None   Collection Time: 11/25/16 12:54 PM  Result Value Ref Range   Prothrombin Time 14.0 11.4 - 15.2 seconds   INR 1.08   APTT     Status: None   Collection Time: 11/25/16 12:54 PM  Result Value Ref Range   aPTT 28 24 - 36 seconds  Fibrinogen     Status: None   Collection Time: 11/25/16 12:54 PM  Result Value Ref Range   Fibrinogen 443 210 - 475 mg/dL  Fibrin derivatives D-Dimer ([facility] only)     Status: Abnormal   Collection Time: 11/25/16 12:54 PM  Result Value Ref Range   Fibrin derivatives D-dimer ([facility]) 567.74 (H) 0.00 - 499.00  Type and screen     Status: None   Collection Time: 11/25/16  1:53 PM  Result Value Ref Range   ABO/RH(D) A POS    Antibody Screen NEG    Sample Expiration 11/28/2016    EXAM: LIMITED OBSTETRIC ULTRASOUND  FINDINGS: Number of Fetuses: 1  Heart Rate:  149 bpm  Movement: Yes  Presentation: Cephalic  Placental Location: Posterior.  No evidence of placental abruption.  Previa: No  Amniotic  Fluid (Subjective):  Within normal limits.  AFI:  14.9 cm  BPD:  8.0cm 32w  MATERNAL FINDINGS:  Cervix:  Closed measuring 3.3 cm in length.  Uterus/Adnexae: No abnormality visualized.  IMPRESSION: 1. Single live intrauterine pregnancy with a measured gestational age is 32 weeks. Normal fetal movement and amniotic fluid. Closed cervix. Posterior placenta is unremarkable. No evidence of a pregnancy complication.  This exam is performed on an emergent basis and does not comprehensively evaluate fetal size, dating, or anatomy; follow-up complete OB US should be considered if further fetal assessment is warranted.   Electronically Signed   By: Amie Portland M.D.   On: 11/25/2016 14:04   FHR: 140s with accelerations to 160s to 170s, no decelerations Toco: no contractions seen since returning from ultrasound  A: IUP at 34wk1d with vaginal bleeding Abruption labs WNL except plt count 141K (was 149K 1 mos ago), no obvious sign of abruption on formal ultrasound FWB: currently Cat 1 tracing  P:Discussed findings with patient. Would feel most  comfortable watching fetus and monitoring bleeding overnight and consulting DP in AM regarding POM for bleeding and lagging growth.  Can't rule out chronic abruption. Patient agrees with POM.  Clear liquids Vs q2 hours  Farrel Conners, CNM

## 2016-11-25 NOTE — OB Triage Note (Signed)
Patient presents to triage for complaints of bleeding. Sometimes bleeding is light pink sometimes BRB with small "booger like clots" she has no other complaints

## 2016-11-25 NOTE — H&P (Signed)
OB History & Physical   History of Present Illness:  Chief Complaint:  My bleeding started last night between 5 and 6 PM and has been intermittent since then with bloody mucous clots. HPI:  Sandy [name] is a 26 y.o. G53P2002 female with EDC=01/05/2017 at [redacted]w[redacted]d dated by LMP =03/31/2016 and confirmed with a 5wk6 day ultrasound at [facility] (-3days/ EDC10/05/2016).  Her pregnancy has been complicated by vaginal bleeding intermittently since [redacted] weeks gestation,  concerns for growth restriction,  history of two prior cesarean sections, close interconceptual spacing (last CS 11/29/2015), obesity, and a bicornate uterus. Also has a history of HSVII (first Cesarean for HSV outbreak). Dating criteria in computer was entered in error as 01/08/2017, but after review of records, EDC changed to reflect correct LMP. Patient began her care in [place], then transferred to [place]. She was hospitalized in June when she presented with bleeding and contractions at 23 weeks and was tocolyzed with magnesium and given betamethasone. Her cervix remained closed.  Since that time she has had intermittent bleeding and spotting. Usually spots after intercourse. No IC in 2 days. Denies cramping, vulvovaginal itching, dysuria. Has had some lower back pain for a couple of months. Baby has been moving well.  She presents to L&D for evaluation of persistent spotting/ bleeding since last night at 5 or 6 PM. Mostly sees the blood when wiping. Has not had to wear a pad.   Ultrasounds at 29 and 33wks EFW at the 27th% and the 18.3% respectively and lag behind the dates 2 weeks. All biometric measurements on most recent ultrasound 8/15 (EFW 3#13oz) are in the 10th% [facility]) or smaller    Maternal Medical History:   Past Medical History:  Diagnosis Date  . Bicornate uterus   . Complication of anesthesia    itching after C-Sections  . Family history of adverse reaction to anesthesia    uncle has a hard time waking up  . Genital HSV   . GERD  (gastroesophageal reflux disease)   . Obesity affecting pregnancy    BMI>40    Past Surgical History:  Procedure Laterality Date  . CESAREAN SECTION     for HSV outbreak  . CESAREAN SECTION N/A 11/29/2015   Procedure: CESAREAN SECTION Baby Girl 7lb.[name];  Surgeon: Vena Austria, MD;  Location: [facility] ORS;  Service: Obstetrics;  Laterality: N/A;  . ears Bilateral    tubes  . ESOPHAGOGASTRODUODENOSCOPY (EGD) WITH PROPOFOL N/A 05/04/2016   Procedure: ESOPHAGOGASTRODUODENOSCOPY (EGD) WITH PROPOFOL;  Surgeon: Midge Minium, MD;  Location: [facility];  Service: Endoscopy;  Laterality: N/A;  . TONSILLECTOMY      Allergies  Allergen Reactions  . Oxycodone-Acetaminophen Itching  . Fluticasone Swelling    Mouth swelling  . Percocet [Oxycodone-Acetaminophen] Itching  . Tape     Some tapes cause blisters    Prior to Admission medications   Medication Sig Start Date End Date Taking? Authorizing Provider  Prenatal Vit-Fe Fumarate-FA (MULTIVITAMIN-PRENATAL) 27-0.8 MG TABS tablet Take 1 tablet by mouth daily at 12 noon.   Yes [provider]          Social History: She  reports that she quit smoking about 3 years ago. She has never used smokeless tobacco. She reports that she drinks alcohol. She reports that she does not use drugs.  Family History: family history includes Diabetes in her maternal aunt; Hyperlipidemia in her maternal aunt.   Review of Systems: Negative x 10 systems reviewed except as noted in the HPI.  Physical Exam:  Vital Signs: BP 111/61   Pulse 84   Temp 97.6 F (36.4 C) (Oral)   Ht 5\' 5"  (1.651 m)   Wt 110.7 kg (244 lb)   LMP 03/31/2016   SpO2 99%   BMI 40.60 kg/m  General: no acute distress.  HEENT: normocephalic, atraumatic Heart: regular rate & rhythm.  No murmurs Lungs: clear to auscultation bilaterally Abdomen: soft, gravid, non-tender;  EFW: 4# Pelvic:   External: Multiple cherry angiomas on labia majora, none  bleeding  Vagina: small amount of blood in vagina  Cervix: appeared inflamed, small trickle of blood from cervix that wells up in posterior fourchette  Cervix: thick/closed/ OOP  Wet [place]: +clue cells; - trichomonas;  - yeast   Extremities: non-tender, symmetric, no edema bilaterally.  DTRs: +2  Neurologic: Alert & oriented x 3.    Pertinent Results:  Prenatal Labs: Blood type/Rh A positive  Antibody screen negative  Rubella Varicella immune immune  RPR Non reactive  HBsAg negative  HIV Non reactive  GC negative  Chlamydia negative  Genetic screening Negative First test  1 hour GTT 115/ 126  3 hour GTT NA  GBS negative on 6/7   Baseline FHR: 145 with accelerations to 160s to 170, moderate variability, occasional decelerations to 120 Toco: irregular mild contractions initially, resolved with rest. Bedside Ultrasound:  Number of Fetus: singleton  Presentation: cephalic  Fluid: subjectively normal  Placental Location: fundal, with some hypoechoic area under posterior portion of placenta  Assessment:  Sandy [name] is a 26 y.o. G63P2002 female at [redacted]w[redacted]d with vaginal bleeding R/O placental abruption FWB-Cat 2 tracing initially, now Cat 1 Prior Cesarean section x 2   Bacterial vaginosis  Plan:  1. Admit to Labor & Delivery for observation   2. CBC, T&S, abruption labs 3. NPO, IV at 149ml/hr 4. Limited ultrasound to check for placenta abruption and AFI. 5. Continuous FHR monitoring 6. Consult Dr Valentino Saxon regarding POM. 7. Treat BV if discharged  Farrel Conners  11/25/2016 12:54 PM

## 2016-11-26 ENCOUNTER — Other Ambulatory Visit: Payer: Self-pay | Admitting: *Deleted

## 2016-11-26 ENCOUNTER — Observation Stay: Payer: Medicaid Other

## 2016-11-26 ENCOUNTER — Telehealth: Payer: Self-pay

## 2016-11-26 DIAGNOSIS — O23593 Infection of other part of genital tract in pregnancy, third trimester: Secondary | ICD-10-CM | POA: Diagnosis not present

## 2016-11-26 DIAGNOSIS — O4693 Antepartum hemorrhage, unspecified, third trimester: Secondary | ICD-10-CM

## 2016-11-26 DIAGNOSIS — Z3A34 34 weeks gestation of pregnancy: Secondary | ICD-10-CM

## 2016-11-26 LAB — TYPE AND SCREEN
ABO/RH(D): A POS
Antibody Screen: NEGATIVE

## 2016-11-26 MED ORDER — BETAMETHASONE SOD PHOS & ACET 6 (3-3) MG/ML IJ SUSP
12.0000 mg | INTRAMUSCULAR | Status: DC
Start: 2016-11-26 — End: 2016-11-26
  Administered 2016-11-26: 12 mg via INTRAMUSCULAR

## 2016-11-26 NOTE — Discharge Summary (Signed)
Physician Final Progress Note  Patient ID: Sandy [name] MRN: [id number] DOB/AGE: 25/24/1992 25 y.o.  Admit date: 11/25/2016 Admitting provider: Hildred Laser, MD Discharge date: 11/26/2016   Admission Diagnoses:  Chief Complaint:  My bleeding started last night between 5 and 6 PM and has been intermittent since then with bloody mucous clots. HPI:  Sandy [name] is a 26 y.o. G37P2002 female with EDC=01/05/2017 at [redacted]w[redacted]d dated by LMP =03/31/2016 and confirmed with a 5wk6 day ultrasound at [facility] (-3days/ EDC10/05/2016).  Her pregnancy has been complicated by vaginal bleeding intermittently since [redacted] weeks gestation,  concerns for growth restriction,  history of two prior cesarean sections, close interconceptual spacing (last CS 11/29/2015), obesity, and a bicornate uterus. Also has a history of HSVII (first Cesarean for HSV outbreak). Dating criteria in computer was entered in error as 01/08/2017, but after review of records, EDC changed to reflect correct LMP. Patient began her care in [place], then transferred to [place]. She was hospitalized in June when she presented with bleeding and contractions at 23 weeks and was tocolyzed with magnesium and given betamethasone. Her cervix remained closed.  Since that time she has had intermittent bleeding and spotting. Usually spots after intercourse. No IC in 2 days. Denies cramping, vulvovaginal itching, dysuria. Has had some lower back pain for a couple of months. Baby has been moving well.  She presents to L&D for evaluation of persistent spotting/ bleeding since last night at 5 or 6 PM. Mostly sees the blood when wiping. Has not had to wear a pad.   Ultrasounds at 29 and 33wks EFW at the 27th% and the 18.3% respectively and lag behind the dates 2 weeks. All biometric measurements on most recent ultrasound 8/15 (EFW 3#13oz) are in the 10th% [facility]) or smaller  Discharge Diagnoses:  Active Problems:   Vaginal bleeding in pregnancy IUP at [redacted]w[redacted]d with reactive NST,  possible placenta mediated disease, fetal growth restriction, BPP 8/8   Past Medical History:  Diagnosis Date  . Bicornate uterus   . Complication of anesthesia    itching after C-Sections  . Family history of adverse reaction to anesthesia    uncle has a hard time waking up  . Genital HSV   . GERD (gastroesophageal reflux disease)   . Obesity affecting pregnancy    BMI>40    Past Surgical History:  Procedure Laterality Date  . CESAREAN SECTION     for HSV outbreak  . CESAREAN SECTION N/A 11/29/2015   Procedure: CESAREAN SECTION Baby Girl 7lb.Sandy [name].;  Surgeon: Vena Austria, MD;  Location: [facility] ORS;  Service: Obstetrics;  Laterality: N/A;  . ears Bilateral    tubes  . ESOPHAGOGASTRODUODENOSCOPY (EGD) WITH PROPOFOL N/A 05/04/2016   Procedure: ESOPHAGOGASTRODUODENOSCOPY (EGD) WITH PROPOFOL;  Surgeon: Midge Minium, MD;  Location: [facility];  Service: Endoscopy;  Laterality: N/A;  . TONSILLECTOMY      No current facility-administered medications on file prior to encounter.    No current outpatient prescriptions on file prior to encounter.    Allergies  Allergen Reactions  . Oxycodone-Acetaminophen Itching  . Fluticasone Swelling    Mouth swelling  . Percocet [Oxycodone-Acetaminophen] Itching  . Tape     Some tapes cause blisters    Social History   Social History  . Marital status: Legally Separated    Spouse name: N/A  . Number of children: N/A  . Years of education: N/A   Occupational History  . Not on file.   Social History Main Topics  .  Smoking status: Former Smoker    Quit date: 04/09/2013  . Smokeless tobacco: Never Used  . Alcohol use Yes     Comment: 2x/month  . Drug use: No  . Sexual activity: Yes   Other Topics Concern  . Not on file   Social History Narrative  . No narrative on file    Physical Exam: BP (!) 90/43 (BP Location: Right Arm)   Pulse 92   Temp 98.1 F (36.7 C) (Oral)   Resp 16   Ht 5\' 5"  (1.651 m)   Wt 244 lb  (110.7 kg)   LMP 03/31/2016   SpO2 99%   BMI 40.60 kg/m   Gen: NAD CV: RRR Pulm: CTAB Pelvic: deferred Toco: Occasional contractions, patient not feeling them, mild to palpation Fetal Well Being: 145 bpm, moderate variability, +accelerations, -decelerations Category I tracing Ext: no evidence of DVT  Consults: [facility]  Significant Findings/ Diagnostic Studies: Ultrasound  OBSTETRICS REPORT                      (Signed Final 11/26/2016 12:29 pm) ---------------------------------------------------------------------- PATIENT INFO:  ID #:       [id number]                          D.O.B.:  07-29-1990 (26 yrs)  Name:       Sandy [name]                  Visit Date: 11/26/2016 12:05 pm ---------------------------------------------------------------------- PERFORMED BY:  Performed By:     Verne Grain            Ref. Address:     [address], [place],                                                             [place] [address]  Referred By:      Farrel Conners CNM ---------------------------------------------------------------------- SERVICE(S) PROVIDED:   [place] MFM OB FOLLOW UP                                  [id number]  ---------------------------------------------------------------------- INDICATIONS:   [redacted] weeks gestation of pregnancy                Z3A.34  ---------------------------------------------------------------------- FETAL EVALUATION:  Num Of Fetuses:     1  Preg. Location:     Mid  Fetal Heart         133  Rate(bpm):  Cardiac Activity:   Present  Presentation:       Vertex  Placenta:           Posterior  P. Cord Insertion:  Normal  Amniotic Fluid  AFI FV:  Normal  AFI Sum(cm)     %Tile       Largest Pocket(cm)  14.7            52          4.8 ---------------------------------------------------------------------- BIOMETRY:  BPD:      78.9  mm     G. Age:  31w 5d          2   %    CI:        70.57   %    70 - 86                                                          FL/HC:      21.8   %    19.4 - 21.8  HC:      299.4  mm     G. Age:  33w 1d          4  %    HC/AC:      1.06        0.96 - 1.11  AC:      282.6  mm     G. Age:  32w 2d          8  %    FL/BPD:     82.6   %    71 - 87  FL:       65.2  mm     G. Age:  33w 4d         24  %    FL/AC:      23.1   %    20 - 24  HUM:      55.8  mm     G. Age:  32w 3d         22  %  Est. FW:    2036  gm      4 lb 8 oz     11  % ---------------------------------------------------------------------- GESTATIONAL AGE:  LMP:           34w 2d        Date:  03/31/16                 EDD:   01/05/17  U/S Today:     32w 5d                                        EDD:   01/16/17  Best:          34w 2d     Det. By:  LMP  (03/31/16)          EDD:   01/05/17 ---------------------------------------------------------------------- ANATOMY:  Cranium:               Within Normal Limits   LVOT:                   Normal appearance  Cavum:                 Within Normal Limits   Aortic Arch:            Normal appearance  Ventricles:  Normal appearance      Ductal Arch:            Normal appearance  Choroid Plexus:        Within Normal Limits   Diaphragm:              Within Normal Limits  Cerebellum:            Within Normal Limits   Stomach:                Seen  Posterior Fossa:       Within Normal Limits   Abdomen:                Normal appearance  Nuchal Fold:           Beyond 22 weeks        Abdominal Wall:         Suboptimal                         gestation  Face:                  Within Normal Limits   Cord Vessels:           Within Normal                                                                        Limits, 3 vessels  Lips:                  Normal appearance      Kidneys:                Normal appearance  Thoracic:              Normal appearance      Bladder:                Seen  Heart:                 Normal  appearance      Spine:                  Normal appearance  RVOT:                  Normal appearance ---------------------------------------------------------------------- DOPPLER - FETAL VESSELS:  Umbilical Artery   S/D     %tile  2.88       67 ---------------------------------------------------------------------- IMPRESSION:  Dear Jobie Quaker,  Thank you for referring your patient for fetal growth  assessment and for placental evaluation due to admission for  bleeding in pregnancy.  Her history is significant for elevated  BMI, previousl c/s x 2 and bicornutate uterus. Her fisrst  pregnancy ended at 40 weeks in the setting of preeclampsia.  She reports episodes of postcoital bleeding, but was noted to  have contractions associated with (resolved) CAT 2 tracing.  She was admitted to [facility] at 23w due to preterm labor and  had detailed anatomic survey, as well as a course of  betamethasone.  Dating is by LMP consistent with US performed at [facility] on  05/14/16; measurements were consistent with 5 weeks 3 days.  There is a singleton gestation  with normal amniotic fluid  volume.  The follow up fetal anatomy is unremarkable. The  placenta appears heterogenous and calcified, however, no  abruption is noted.  The fetal biometry correlates with established dating.  Adequate interval growth noted.  The estimated fetal weight is at the 11   percentile, however  the fetal AC is at the 8th percentile.  The dopplers are normal  and the amnitoic fluid volume is normal.  BPP=8/8.  Given her history of contractions and bleeding, as well as  borderline fetal growth with the fetal AC at the 8th percentile,  I am concerned that she may have placenta mediated disase  and a chronic abruption.  In this setting, I would recommend delivery at [redacted] weeks  gestation.  I recommend twice weekly antenatal testing.  I reveiwed these concerns with Loray.  I reviewed the poor  predicatbility and diagnostic capacity  of ultrasound for  abruption--the diagnosis is clinical. I also recommended she  receive a repeat steroid course given the recommended  timing of her repeat cesarean.  Thank you for allowing [place] to participate in your patient's care.  Please do not hesitate to contact us if we can be of further  assistance. ----------------------------------------------------------------------                   Consuelo Pandy, MD Electronically Signed Final Report   11/26/2016 12:29 pm ----------------------------------------------------------------------  Procedures: NST  Discharge Condition: good  Disposition: 70-Another Health Care Institution Not Defined  Diet: Regular diet  Discharge Activity: Activity as tolerated  Discharge Instructions    Discharge activity:  No Restrictions    Complete by:  As directed    Discharge diet:  No restrictions    Complete by:  As directed    Discharge instructions    Complete by:  As directed    Return in 24 hours for second dose of betamethasone   Fetal Kick Count:  Lie on our left side for one hour after a meal, and count the number of times your baby kicks.  If it is less than 5 times, get up, move around and drink some juice.  Repeat the test 30 minutes later.  If it is still less than 5 kicks in an hour, notify your doctor.    Complete by:  As directed    Notify physician for a general feeling that "something is not right"    Complete by:  As directed    Notify physician for increase or change in vaginal discharge    Complete by:  As directed    Notify physician for intestinal cramps, with or without diarrhea, sometimes described as "gas pain"    Complete by:  As directed    Notify physician for leaking of fluid    Complete by:  As directed    Notify physician for low, dull backache, unrelieved by heat or Tylenol    Complete by:  As directed    Notify physician for menstrual like cramps    Complete by:  As directed    Notify physician for pelvic  pressure    Complete by:  As directed    Notify physician for uterine contractions.  These may be painless and feel like the uterus is tightening or the baby is  "balling up"    Complete by:  As directed    Notify physician for vaginal bleeding    Complete by:  As directed    PRETERM LABOR:  Includes any of the follwing  symptoms that occur between 20 - [redacted] weeks gestation.  If these symptoms are not stopped, preterm labor can result in preterm delivery, placing your baby at risk    Complete by:  As directed    Sexual Activity:      Complete by:  As directed    Restrictions for bleeding     Allergies as of 11/26/2016      Reactions   Oxycodone-acetaminophen Itching   Fluticasone Swelling   Mouth swelling   Percocet [oxycodone-acetaminophen] Itching   Tape    Some tapes cause blisters      Medication List    TAKE these medications   multivitamin-prenatal 27-0.8 MG Tabs tablet Take 1 tablet by mouth daily at 12 noon.      Follow-up Information    [facility] Go on 11/29/2016.   Why:  NST, ROB appointment at 9:30 AM  Return to Labor and delivery Tuesday, 8/21 @ 1:00PM  for Betemethasone injection Contact information: [address] [place] [address] [phone]          Total time spent taking care of this patient: 30 minutes  Signed: Tresea Mall, CNM  11/26/2016, 1:36 PM

## 2016-11-26 NOTE — Telephone Encounter (Signed)
Pt called after hour nurse c/o 34wks c vaginal bleeding since the 18th.  She was adv per protocol to go to L&D where she is currently admitted.

## 2016-11-26 NOTE — Progress Notes (Signed)
L&D Progress Note  S: Intermittent bleeding when wiping during the night. Baby active. Slept a little  O: BP (!) 90/43 (BP Location: Right Arm)   Pulse 92   Temp 98.1 F (36.7 C) (Oral)   Resp 16   Ht 5\' 5"  (1.651 m)   Wt 110.7 kg (244 lb)   LMP 03/31/2016   SpO2 99%   BMI 40.60 kg/m    General: in NAD FHR: 130s with accelerations to 160s to 170s, moderate variability, no decelerations. Toco: no contractions during night, but when toco readjusted this AM, contractions q 3-4 minutes apart, mild  A: IUP at 34wk2d with vaginal bleeding/spotting Contractions this AM FWB: Cat 1 tracing this AM  P: Consult to DP pending regarding plan of management  Farrel Conners, CNM

## 2016-11-26 NOTE — Progress Notes (Signed)
Returned to room from [facility].

## 2016-11-27 ENCOUNTER — Observation Stay
Admission: RE | Admit: 2016-11-27 | Discharge: 2016-11-27 | Disposition: A | Discharge status: Home / Self Care | Payer: Medicaid Other | Attending: Obstetrics & Gynecology | Admitting: Obstetrics & Gynecology

## 2016-11-27 DIAGNOSIS — Z3A34 34 weeks gestation of pregnancy: Secondary | ICD-10-CM | POA: Diagnosis not present

## 2016-11-27 MED ORDER — ONDANSETRON HCL 4 MG/2ML IJ SOLN: 4.0000 mg | Freq: Four times a day (QID) | INTRAMUSCULAR | Status: DC | PRN

## 2016-11-27 MED ORDER — ACETAMINOPHEN 325 MG PO TABS: 650.0000 mg | ORAL_TABLET | ORAL | Status: DC | PRN

## 2016-11-27 MED ORDER — BETAMETHASONE SOD PHOS & ACET 6 (3-3) MG/ML IJ SUSP
12.0000 mg | Freq: Once | INTRAMUSCULAR | Status: AC
  Administered 2016-11-27: 12 mg via INTRAMUSCULAR

## 2016-11-27 NOTE — Discharge Instructions (Signed)

## 2016-11-27 NOTE — Discharge Summary (Signed)
See fpn  

## 2016-11-27 NOTE — Final Progress Note (Signed)
Physician Final Progress Note  Patient ID: Sandy [name] MRN: [id number] DOB/AGE: 25/22/1992 26 y.o.  Admit date: 11/27/2016 Admitting provider: Nadara Mustard, MD Discharge date: 11/27/2016   Admission Diagnoses: Preterm Labor, [redacted] weeks EGA  Discharge Diagnoses:  Active Problems:   Preterm labor  Consults: None  Significant Findings/ Diagnostic Studies: None  Procedures: beta methasone injection  Discharge Condition: good  Disposition: 01-Home or Self Care  Diet: Regular diet  Discharge Activity: Activity as tolerated   Allergies as of 11/27/2016      Reactions   Oxycodone-acetaminophen Itching   Fluticasone Swelling   Mouth swelling   Percocet [oxycodone-acetaminophen] Itching   Tape    Some tapes cause blisters      Medication List    TAKE these medications   multivitamin-prenatal 27-0.8 MG Tabs tablet Take 1 tablet by mouth daily at 12 noon.        Total time spent taking care of this patient: TRIAGE  Signed: Letitia Libra 11/27/2016, 1:13 PM

## 2016-11-27 NOTE — Progress Notes (Signed)
Ms. Dutrow here for second betamethasone injection, reports decreased fetal movement this AM, denies bleeding. Just found out that her husband is in ED with broken leg. Her preference is to go to see him in ED, will drink apple juice provided and sit, do kick count. If no improvement, agrees to return to [facility] for monitoring. Pt advised that she would need to be monitored if she does not feel adequate movements,she agrees to plan.

## 2016-11-28 ENCOUNTER — Telehealth: Payer: Self-pay | Admitting: Obstetrics and Gynecology

## 2016-11-28 ENCOUNTER — Observation Stay
Admission: EM | Admit: 2016-11-28 | Discharge: 2016-11-28 | Disposition: A | Discharge status: Home / Self Care | Payer: Medicaid Other | Attending: Obstetrics and Gynecology | Admitting: Obstetrics and Gynecology

## 2016-11-28 ENCOUNTER — Encounter: Payer: Self-pay | Admitting: *Deleted

## 2016-11-28 ENCOUNTER — Telehealth: Payer: Self-pay

## 2016-11-28 DIAGNOSIS — O4593 Premature separation of placenta, unspecified, third trimester: Principal | ICD-10-CM | POA: Insufficient documentation

## 2016-11-28 DIAGNOSIS — O99213 Obesity complicating pregnancy, third trimester: Secondary | ICD-10-CM | POA: Diagnosis not present

## 2016-11-28 DIAGNOSIS — Z3A34 34 weeks gestation of pregnancy: Secondary | ICD-10-CM | POA: Diagnosis not present

## 2016-11-28 DIAGNOSIS — O34219 Maternal care for unspecified type scar from previous cesarean delivery: Secondary | ICD-10-CM

## 2016-11-28 DIAGNOSIS — Z8759 Personal history of other complications of pregnancy, childbirth and the puerperium: Secondary | ICD-10-CM | POA: Insufficient documentation

## 2016-11-28 DIAGNOSIS — O09299 Supervision of pregnancy with other poor reproductive or obstetric history, unspecified trimester: Secondary | ICD-10-CM

## 2016-11-28 DIAGNOSIS — Z6841 Body Mass Index (BMI) 40.0 and over, adult: Secondary | ICD-10-CM | POA: Insufficient documentation

## 2016-11-28 DIAGNOSIS — O09623 Supervision of young multigravida, third trimester: Secondary | ICD-10-CM

## 2016-11-28 LAB — KLEIHAUER-BETKE STAIN
FETAL CELLS %: 0 %
QUANTITATION FETAL HEMOGLOBIN: 0 mL

## 2016-11-28 NOTE — OB Triage Note (Signed)
Recvd to OBS 2 with complaints of decreased fetal movement, diarrhea, general feeling of malaise. Changed to gown and to bed. EFM applied.  Oriented to room and plan of care discussed.  Verbalized understanding and agrees with plan

## 2016-11-28 NOTE — Telephone Encounter (Signed)
Due to no openings in office today, pt adv to go to L&D.  Carmen notified.

## 2016-11-28 NOTE — Telephone Encounter (Signed)
Some could be form the injections but bleeding should be re-evaluated

## 2016-11-28 NOTE — Telephone Encounter (Signed)
Pt received betamethasone shot Mon and yesterday.  Hasn't felt the baby move near as much as she usually does unless pt puts her hand on her.  Today, stomach is hurting really bad, cramping a little bit c it, bleeding is pinkish lightish tint, and has straight diarrhea.  Is this normal or should she be concerned? [phone]

## 2016-11-28 NOTE — Telephone Encounter (Signed)
Lmtrc

## 2016-11-28 NOTE — Final Progress Note (Signed)
Physician Final Progress Note  Patient ID: Sandy [name] MRN: [id number] DOB/AGE: 10-23-1990 26 y.o.  Admit date: 11/28/2016 Admitting provider: Vena Austria, MD Discharge date: 11/28/2016   Admission Diagnoses: Vaginal bleeding third trimester  Discharge Diagnoses:  Active Problems:   High risk pregnancy in young multigravida, third trimester   Labor and delivery indication for care or intervention   Placental abruption, antepartum, third trimester   Hx of preeclampsia, prior pregnancy, currently pregnant   History of cesarean section complicating pregnancy  26 yo G3P2002 at [redacted]w[redacted]d by LMP=[redacted]w[redacted]d [place] presenting for evaluation of vaginal bleeding.  None noted by nursing staff.  No contractions, no LOF, +FM.  Prolonged monitoring with negative KB  Due Sept. 28/2018 Problems (from 09/13/16 to present)    Problem Noted Resolved   High risk pregnancy in young multigravida, third trimester 11/28/2016 by Vena Austria, MD No   Priority:  High     Overview Addendum 11/28/2016  5:14 PM by Vena Austria, MD    Clinic [facility] Prenatal Labs [phone]/28/18 [facility])  Dating LMP = [redacted]w[redacted]d [place] (01/08/17 by [place]) Blood type: --/--/A POS (08/19 1353)   Genetic Screen 1 Screen: negative Antibody:NEG (08/19 1353)  Anatomic [place] Normal female  Rubella: Immune Varicella: Unknown (Immune 2017 pregnancy)  GTT Early:115 trimester: 126 RPR: Non Reactive (07/16 1455)   Rhogam  HBsAg:   TDaP vaccine                       Flu Shot: HIV:   Baby Food                                GBS: Negative 09/14/16  Contraception  Pap: 02/08/2016 NIL  CBB   BMZ 11/26/16 and 11/27/16 with prior course 09/14/16 and 09/15/16  CS/VBAC Repeat Cesarean   Support Person  Hgb AA    Hep C Ab negative         Placental abruption, antepartum, third trimester 11/28/2016 by Vena Austria, MD No   Overview Signed 11/28/2016  4:32 PM by Vena Austria, MD    Twice weekly fetal monitoring and delivery by 37 weeks per [facility]  perinatal      Hx of preeclampsia, prior pregnancy, currently pregnant 11/28/2016 by Vena Austria, MD No   Overview Addendum 11/28/2016  5:15 PM by Vena Austria, MD    NO ASA because of chronic abruption  Baseline and surveillance labs (pulled in from [employer], refresh links as needed)  Lab Results  Component Value Date   PLT 141 (L) 11/25/2016   CREATININE 0.70 04/25/2016   AST 24 04/25/2016   ALT 16 04/25/2016   LABPROT 14.0 11/25/2016         History of cesarean section complicating pregnancy 11/28/2016 by Vena Austria, MD No   Overview Signed 11/28/2016  5:40 PM by Vena Austria, MD    Repeat at 37 weeks scheduled 12/18/16      BMI 40.0-44.9, adult (HCC) 11/21/2016 by Conard Novak, MD No   Overview Signed 11/21/2016  3:56 PM by Conard Novak, MD    BMI >=40 [ ]  early 1h gtt -  [ ]  u/s for dating [ ]   [ ]  nutritional goals [ ]  folic acid 1mg  [ ]  bASA (>12 weeks) [ ]  consider nutrition consult [ ]  consider maternal EKG 1st trimester [ ]  Growth u/s 28 [ ] , 32 [ ] , 36 weeks [ ]  [ ]   NST/AFI weekly 36+ weeks (36[] , 37[] , 38[] , 39[] , 40[] ) [ ]  IOL by 41 weeks (scheduled, prn [] )       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 by Yvone Neu M No   Obesity complicating pregnancy, third trimester 06/04/2016 by Tresea Mall, CNM No   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 by Tresea Mall, CNM No     Vitals:   11/28/16 1257  BP: 128/76  Pulse: 94  Resp: 16  Temp: 98.9 F (37.2 C)    Consults: none  Significant Findings/ Diagnostic Studies: Results for orders placed or performed during the hospital encounter of 11/28/16 (from the past 24 hour(s))  Kleihauer-Betke stain     Status: None   Collection Time: 11/28/16  2:33 PM  Result Value Ref Range   Fetal Cells % 0 %   Quantitation Fetal Hemoglobin 0.000 mL   # Vials RhIg NOT INDICATED     Procedures:  Baseline: 140 Variability: moderate Accelerations: present Decelerations:  absent Tocometry: irritability The patient was monitored for 3-hr, fetal heart rate tracing was deemed reactive, category I tracing,   Discharge Condition: good  Disposition: 01-Home or Self Care  Diet: Regular diet  Discharge Activity: Activity as tolerated  Discharge Instructions    Discharge activity:  No Restrictions    Complete by:  As directed    Discharge diet:  No restrictions    Complete by:  As directed    Fetal Kick Count:  Lie on our left side for one hour after a meal, and count the number of times your baby kicks.  If it is less than 5 times, get up, move around and drink some juice.  Repeat the test 30 minutes later.  If it is still less than 5 kicks in an hour, notify your doctor.    Complete by:  As directed    LABOR:  When conractions begin, you should start to time them from the beginning of one contraction to the beginning  of the next.  When contractions are 5 - 10 minutes apart or less and have been regular for at least an hour, you should call your health care provider.    Complete by:  As directed    No sexual activity restrictions    Complete by:  As directed    Notify physician for bleeding from the vagina    Complete by:  As directed    Notify physician for blurring of vision or spots before the eyes    Complete by:  As directed    Notify physician for chills or fever    Complete by:  As directed    Notify physician for fainting spells, "black outs" or loss of consciousness    Complete by:  As directed    Notify physician for increase in vaginal discharge    Complete by:  As directed    Notify physician for leaking of fluid    Complete by:  As directed    Notify physician for pain or burning when urinating    Complete by:  As directed    Notify physician for pelvic pressure (sudden increase)    Complete by:  As directed    Notify physician for severe or continued nausea or vomiting    Complete by:  As directed    Notify physician for sudden gushing  of fluid from the vagina (with or without continued leaking)    Complete by:  As directed    Notify physician for sudden, constant, or occasional  abdominal pain    Complete by:  As directed    Notify physician if baby moving less than usual    Complete by:  As directed      Allergies as of 11/28/2016      Reactions   Oxycodone-acetaminophen Itching   Fluticasone Swelling   Mouth swelling   Percocet [oxycodone-acetaminophen] Itching   Tape    Some tapes cause blisters      Medication List    TAKE these medications   multivitamin-prenatal 27-0.8 MG Tabs tablet Take 1 tablet by mouth daily at 12 noon.            Discharge Care Instructions        Start     Ordered   11/28/16 0000  LABOR:  When conractions begin, you should start to time them from the beginning of one contraction to the beginning  of the next.  When contractions are 5 - 10 minutes apart or less and have been regular for at least an hour, you should call your health care provider.  Ambulance person Notify Physician)     11/28/16 1742   11/28/16 0000  Notify physician for bleeding from the vagina  (Labor Notify Physician)     11/28/16 1742   11/28/16 0000  Notify physician for pain or burning when urinating  (Labor Notify Physician)     11/28/16 1742   11/28/16 0000  Notify physician for chills or fever  (Labor Notify Physician)     11/28/16 1742   11/28/16 0000  Notify physician for increase in vaginal discharge  (Labor Notify Physician)     11/28/16 1742   11/28/16 0000  Notify physician for pelvic pressure (sudden increase)  Ambulance person Notify Physician)     11/28/16 1742   11/28/16 0000  Notify physician if baby moving less than usual  (Labor Notify Physician)     11/28/16 1742   11/28/16 0000  Notify physician for sudden, constant, or occasional abdominal pain  (Labor Notify Physician)     11/28/16 1742   11/28/16 0000  Notify physician for sudden gushing of fluid from the vagina (with or without continued leaking)   Ambulance person Notify Physician)     11/28/16 1742   11/28/16 0000  Notify physician for leaking of fluid  (Labor Notify Physician)     11/28/16 1742   11/28/16 0000  Notify physician for fainting spells, "black outs" or loss of consciousness  (Labor Notify Physician)     11/28/16 1742   11/28/16 0000  Notify physician for severe or continued nausea or vomiting  (Labor Notify Physician)     11/28/16 1742   11/28/16 0000  Notify physician for blurring of vision or spots before the eyes  (Labor Notify Physician)     11/28/16 1742   11/28/16 0000  Fetal Kick Count:  Lie on our left side for one hour after a meal, and count the number of times your baby kicks.  If it is less than 5 times, get up, move around and drink some juice.  Repeat the test 30 minutes later.  If it is still less than 5 kicks in an hour, notify your doctor.     11/28/16 1742   11/28/16 0000  Discharge activity:  No Restrictions     11/28/16 1742   11/28/16 0000  No sexual activity restrictions     11/28/16 1742   11/28/16 0000  Discharge diet:  No restrictions     11/28/16 1742  Follow-up Information    [facility] Follow up on 11/29/2016.   Contact information: [address] [place] [address] [phone]          Total time spent taking care of this patient: 45 minutes  Signed: Vena Austria 11/28/2016, 5:42 PM

## 2016-11-28 NOTE — Telephone Encounter (Signed)
Patient returned call and is rescheduled for H&P at [facility] on 12/17/16 @ 8:30am w/ Dr. Jean Rosenthal, Pre-admit Testing afterwards, and OR on 12/18/16.

## 2016-11-28 NOTE — Telephone Encounter (Signed)
-----   Message from Tresea Mall, CNM sent at 11/28/2016 12:23 PM EDT ----- Regarding: c/s date change Surgery Booking Request Patient Full Name: Sandy [name]  MRN: [id number]  DOB: 1990-06-05  Surgeon: Jean Rosenthal Requested Surgery Date and Time: 12/18/16 Primary Diagnosis AND Code: repeat c/s Secondary Diagnosis and Code:  Surgical Procedure: Cesarean Section L&D Notification: Yes Admission Status: surgery admit Length of Surgery: 60 m Special Case Needs: on q pump H&P:  (date) Phone Interview???:  Interpreter: Language:  Medical Clearance:  Special Scheduling Instructions:   She was previously scheduled on 9/25

## 2016-11-29 ENCOUNTER — Ambulatory Visit (INDEPENDENT_AMBULATORY_CARE_PROVIDER_SITE_OTHER): Payer: Medicaid Other | Admitting: Obstetrics & Gynecology

## 2016-11-29 VITALS — BP 110/70 | Wt 241.0 lb

## 2016-11-29 DIAGNOSIS — O09623 Supervision of young multigravida, third trimester: Secondary | ICD-10-CM | POA: Diagnosis not present

## 2016-11-29 DIAGNOSIS — Z3A34 34 weeks gestation of pregnancy: Secondary | ICD-10-CM

## 2016-11-29 DIAGNOSIS — O99213 Obesity complicating pregnancy, third trimester: Secondary | ICD-10-CM

## 2016-11-29 DIAGNOSIS — O34219 Maternal care for unspecified type scar from previous cesarean delivery: Secondary | ICD-10-CM

## 2016-11-29 LAB — FETAL NONSTRESS TEST

## 2016-11-29 NOTE — Progress Notes (Signed)
See notes, no recent pain or bleeding since last L&D visit. A NST procedure was performed with FHR monitoring and a normal baseline established, appropriate time of 20-40 minutes of evaluation, and accels >2 seen w 15x15 characteristics.  Results show a REACTIVE NST.  Plan NST/AFI Mon and continued monitoring CS at 37 weeks due to IUGR, h/o abruption, prior CS.

## 2016-11-29 NOTE — Patient Instructions (Signed)
Third Trimester of Pregnancy The third trimester is from week 28 through week 40 (months 7 through 9). The third trimester is a time when the unborn baby (fetus) is growing rapidly. At the end of the ninth month, the fetus is about 20 inches in length and weighs 6-10 pounds. Body changes during your third trimester Your body will continue to go through many changes during pregnancy. The changes vary from woman to woman. During the third trimester:  Your weight will continue to increase. You can expect to gain 25-35 pounds (11-16 kg) by the end of the pregnancy.  You may begin to get stretch marks on your hips, abdomen, and breasts.  You may urinate more often because the fetus is moving lower into your pelvis and pressing on your bladder.  You may develop or continue to have heartburn. This is caused by increased hormones that slow down muscles in the digestive tract.  You may develop or continue to have constipation because increased hormones slow digestion and cause the muscles that push waste through your intestines to relax.  You may develop hemorrhoids. These are swollen veins (varicose veins) in the rectum that can itch or be painful.  You may develop swollen, bulging veins (varicose veins) in your legs.  You may have increased body aches in the pelvis, back, or thighs. This is due to weight gain and increased hormones that are relaxing your joints.  You may have changes in your hair. These can include thickening of your hair, rapid growth, and changes in texture. Some women also have hair loss during or after pregnancy, or hair that feels dry or thin. Your hair will most likely return to normal after your baby is born.  Your breasts will continue to grow and they will continue to become tender. A yellow fluid (colostrum) may leak from your breasts. This is the first milk you are producing for your baby.  Your belly button may stick out.  You may notice more swelling in your hands,  face, or ankles.  You may have increased tingling or numbness in your hands, arms, and legs. The skin on your belly may also feel numb.  You may feel short of breath because of your expanding uterus.  You may have more problems sleeping. This can be caused by the size of your belly, increased need to urinate, and an increase in your body's metabolism.  You may notice the fetus "dropping," or moving lower in your abdomen (lightening).  You may have increased vaginal discharge.  You may notice your joints feel loose and you may have pain around your pelvic bone.  What to expect at prenatal visits You will have prenatal exams every 2 weeks until week 36. Then you will have weekly prenatal exams. During a routine prenatal visit:  You will be weighed to make sure you and the baby are growing normally.  Your blood pressure will be taken.  Your abdomen will be measured to track your baby's growth.  The fetal heartbeat will be listened to.  Any test results from the previous visit will be discussed.  You may have a cervical check near your due date to see if your cervix has softened or thinned (effaced).  You will be tested for Group B streptococcus. This happens between 35 and 37 weeks.  Your health care provider may ask you:  What your birth plan is.  How you are feeling.  If you are feeling the baby move.  If you have had   any abnormal symptoms, such as leaking fluid, bleeding, severe headaches, or abdominal cramping.  If you are using any tobacco products, including cigarettes, chewing tobacco, and electronic cigarettes.  If you have any questions.  Other tests or screenings that may be performed during your third trimester include:  Blood tests that check for low iron levels (anemia).  Fetal testing to check the health, activity level, and growth of the fetus. Testing is done if you have certain medical conditions or if there are problems during the  pregnancy.  Nonstress test (NST). This test checks the health of your baby to make sure there are no signs of problems, such as the baby not getting enough oxygen. During this test, a belt is placed around your belly. The baby is made to move, and its heart rate is monitored during movement.  What is false labor? False labor is a condition in which you feel small, irregular tightenings of the muscles in the womb (contractions) that usually go away with rest, changing position, or drinking water. These are called Braxton Hicks contractions. Contractions may last for hours, days, or even weeks before true labor sets in. If contractions come at regular intervals, become more frequent, increase in intensity, or become painful, you should see your health care provider. What are the signs of labor?  Abdominal cramps.  Regular contractions that start at 10 minutes apart and become stronger and more frequent with time.  Contractions that start on the top of the uterus and spread down to the lower abdomen and back.  Increased pelvic pressure and dull back pain.  A watery or bloody mucus discharge that comes from the vagina.  Leaking of amniotic fluid. This is also known as your "water breaking." It could be a slow trickle or a gush. Let your health care provider know if it has a color or strange odor. If you have any of these signs, call your health care provider right away, even if it is before your due date. Follow these instructions at home: Medicines  Follow your health care provider's instructions regarding medicine use. Specific medicines may be either safe or unsafe to take during pregnancy.  Take a prenatal vitamin that contains at least 600 micrograms (mcg) of folic acid.  If you develop constipation, try taking a stool softener if your health care provider approves. Eating and drinking  Eat a balanced diet that includes fresh fruits and vegetables, whole grains, good sources of protein  such as meat, eggs, or tofu, and low-fat dairy. Your health care provider will help you determine the amount of weight gain that is right for you.  Avoid raw meat and uncooked cheese. These carry germs that can cause birth defects in the baby.  If you have low calcium intake from food, talk to your health care provider about whether you should take a daily calcium supplement.  Eat four or five small meals rather than three large meals a day.  Limit foods that are high in fat and processed sugars, such as fried and sweet foods.  To prevent constipation: ? Drink enough fluid to keep your urine clear or pale yellow. ? Eat foods that are high in fiber, such as fresh fruits and vegetables, whole grains, and beans. Activity  Exercise only as directed by your health care provider. Most women can continue their usual exercise routine during pregnancy. Try to exercise for 30 minutes at least 5 days a week. Stop exercising if you experience uterine contractions.  Avoid heavy   lifting.  Do not exercise in extreme heat or humidity, or at high altitudes.  Wear low-heel, comfortable shoes.  Practice good posture.  You may continue to have sex unless your health care provider tells you otherwise. Relieving pain and discomfort  Take frequent breaks and rest with your legs elevated if you have leg cramps or low back pain.  Take warm sitz baths to soothe any pain or discomfort caused by hemorrhoids. Use hemorrhoid cream if your health care provider approves.  Wear a good support bra to prevent discomfort from breast tenderness.  If you develop varicose veins: ? Wear support pantyhose or compression stockings as told by your healthcare provider. ? Elevate your feet for 15 minutes, 3-4 times a day. Prenatal care  Write down your questions. Take them to your prenatal visits.  Keep all your prenatal visits as told by your health care provider. This is important. Safety  Wear your seat belt at  all times when driving.  Make a list of emergency phone numbers, including numbers for family, friends, the hospital, and police and fire departments. General instructions  Avoid cat litter boxes and soil used by cats. These carry germs that can cause birth defects in the baby. If you have a cat, ask someone to clean the litter box for you.  Do not travel far distances unless it is absolutely necessary and only with the approval of your health care provider.  Do not use hot tubs, steam rooms, or saunas.  Do not drink alcohol.  Do not use any products that contain nicotine or tobacco, such as cigarettes and e-cigarettes. If you need help quitting, ask your health care provider.  Do not use any medicinal herbs or unprescribed drugs. These chemicals affect the formation and growth of the baby.  Do not douche or use tampons or scented sanitary pads.  Do not cross your legs for long periods of time.  To prepare for the arrival of your baby: ? Take prenatal classes to understand, practice, and ask questions about labor and delivery. ? Make a trial run to the hospital. ? Visit the hospital and tour the maternity area. ? Arrange for maternity or paternity leave through employers. ? Arrange for family and friends to take care of pets while you are in the hospital. ? Purchase a rear-facing car seat and make sure you know how to install it in your car. ? Pack your hospital bag. ? Prepare the baby's nursery. Make sure to remove all pillows and stuffed animals from the baby's crib to prevent suffocation.  Visit your dentist if you have not gone during your pregnancy. Use a soft toothbrush to brush your teeth and be gentle when you floss. Contact a health care provider if:  You are unsure if you are in labor or if your water has broken.  You become dizzy.  You have mild pelvic cramps, pelvic pressure, or nagging pain in your abdominal area.  You have lower back pain.  You have persistent  nausea, vomiting, or diarrhea.  You have an unusual or bad smelling vaginal discharge.  You have pain when you urinate. Get help right away if:  Your water breaks before 37 weeks.  You have regular contractions less than 5 minutes apart before 37 weeks.  You have a fever.  You are leaking fluid from your vagina.  You have spotting or bleeding from your vagina.  You have severe abdominal pain or cramping.  You have rapid weight loss or weight gain.    You have shortness of breath with chest pain.  You notice sudden or extreme swelling of your face, hands, ankles, feet, or legs.  Your baby makes fewer than 10 movements in 2 hours.  You have severe headaches that do not go away when you take medicine.  You have vision changes. Summary  The third trimester is from week 28 through week 40, months 7 through 9. The third trimester is a time when the unborn baby (fetus) is growing rapidly.  During the third trimester, your discomfort may increase as you and your baby continue to gain weight. You may have abdominal, leg, and back pain, sleeping problems, and an increased need to urinate.  During the third trimester your breasts will keep growing and they will continue to become tender. A yellow fluid (colostrum) may leak from your breasts. This is the first milk you are producing for your baby.  False labor is a condition in which you feel small, irregular tightenings of the muscles in the womb (contractions) that eventually go away. These are called Braxton Hicks contractions. Contractions may last for hours, days, or even weeks before true labor sets in.  Signs of labor can include: abdominal cramps; regular contractions that start at 10 minutes apart and become stronger and more frequent with time; watery or bloody mucus discharge that comes from the vagina; increased pelvic pressure and dull back pain; and leaking of amniotic fluid. This information is not intended to replace advice  given to you by your health care provider. Make sure you discuss any questions you have with your health care provider. Document Released: 03/20/2001 Document Revised: 09/01/2015 Document Reviewed: 05/27/2012 [employer] Interactive Patient Education  2017 [employer]  

## 2016-12-03 ENCOUNTER — Ambulatory Visit (INDEPENDENT_AMBULATORY_CARE_PROVIDER_SITE_OTHER): Payer: Medicaid Other | Admitting: Obstetrics and Gynecology

## 2016-12-03 ENCOUNTER — Ambulatory Visit (INDEPENDENT_AMBULATORY_CARE_PROVIDER_SITE_OTHER): Payer: Medicaid Other

## 2016-12-03 VITALS — BP 118/74 | Wt 243.0 lb

## 2016-12-03 DIAGNOSIS — Z362 Encounter for other antenatal screening follow-up: Secondary | ICD-10-CM

## 2016-12-03 DIAGNOSIS — O99213 Obesity complicating pregnancy, third trimester: Secondary | ICD-10-CM

## 2016-12-03 DIAGNOSIS — Z3A35 35 weeks gestation of pregnancy: Secondary | ICD-10-CM

## 2016-12-03 DIAGNOSIS — Q513 Bicornate uterus: Secondary | ICD-10-CM

## 2016-12-03 DIAGNOSIS — B009 Herpesviral infection, unspecified: Secondary | ICD-10-CM

## 2016-12-03 DIAGNOSIS — O09623 Supervision of young multigravida, third trimester: Secondary | ICD-10-CM | POA: Diagnosis not present

## 2016-12-03 DIAGNOSIS — O4593 Premature separation of placenta, unspecified, third trimester: Secondary | ICD-10-CM | POA: Diagnosis not present

## 2016-12-03 DIAGNOSIS — Z6841 Body Mass Index (BMI) 40.0 and over, adult: Secondary | ICD-10-CM

## 2016-12-03 DIAGNOSIS — O09299 Supervision of pregnancy with other poor reproductive or obstetric history, unspecified trimester: Secondary | ICD-10-CM

## 2016-12-03 DIAGNOSIS — O34219 Maternal care for unspecified type scar from previous cesarean delivery: Secondary | ICD-10-CM

## 2016-12-03 MED ORDER — VALACYCLOVIR HCL 500 MG PO TABS: 500.0000 mg | ORAL_TABLET | Freq: Two times a day (BID) | ORAL | 2 refills | Status: DC

## 2016-12-03 NOTE — Progress Notes (Signed)
Routine Prenatal Care Visit  Subjective  Sandy [name] is a 26 y.o. G3P2002 at [redacted]w[redacted]d being seen today for ongoing prenatal care.  She is currently monitored for the following issues for this high-risk pregnancy and has HSV-2 (herpes simplex virus 2) infection; Obesity complicating pregnancy, third trimester; Bicornuate uterus; BMI 40.0-44.9, adult (HCC); Placental abruption, antepartum, third trimester; High risk pregnancy in young multigravida, third trimester; Hx of preeclampsia, prior pregnancy, currently pregnant; and History of cesarean section complicating pregnancy on her problem list.  ----------------------------------------------------------------------------------- Patient reports mild vaginal bleeding yesterday that was initially bright red, with small clots.  Scant dark red blood today. no cramping.   Contractions: Not present.  .  Movement: Present. Denies leaking of fluid.  Start Valtrex just today.  Rx sent in. AFI 10.2 CM today ----------------------------------------------------------------------------------- The following portions of the patient's history were reviewed and updated as appropriate: allergies, current medications, past family history, past medical history, past social history, past surgical history and problem list. Problem list updated.   Objective  Blood pressure 118/74, weight 243 lb (110.2 kg), last menstrual period 03/31/2016, unknown if currently breastfeeding. Pregravid weight Pregravid weight not on file Total Weight Gain Not found. Urinalysis: Urine Protein: Negative Urine Glucose: Negative  Fetal Status:     Movement: Present     General:  Alert, oriented and cooperative. Patient is in no acute distress.  Skin: Skin is warm and dry. No rash noted.   Cardiovascular: Normal heart rate noted  Respiratory: Normal respiratory effort, no problems with respiration noted  Abdomen: Soft, gravid, appropriate for gestational age. Pain/Pressure: Absent      Pelvic:  Cervical exam deferred        Extremities: Normal range of motion.     Mental Status: Normal mood and affect. Normal behavior. Normal judgment and thought content.   NST procedure Baseline FHR: 145 beats/min Variability: moderate Accelerations: present Decelerations: absent Tocometry: not done  Interpretation:  INDICATIONS: chronic placental abruption RESULTS:  A NST procedure was performed with FHR monitoring and a normal baseline established, appropriate time of 20-40 minutes of evaluation, and accels >2 seen w 15x15 characteristics.  Results show a REACTIVE NST.    Assessment   26 y.o. [id number] at [redacted]w[redacted]d by  01/05/2017, by Last Menstrual Period presenting for routine prenatal visit  Plan   Due Sept. 28/2018 Problems (from 09/13/16 to present)    Problem Noted Resolved   Placental abruption, antepartum, third trimester 11/28/2016 by Vena Austria, MD No   Overview Signed 11/28/2016  4:32 PM by Vena Austria, MD    Twice weekly fetal monitoring and delivery by 37 weeks per [facility]      High risk pregnancy in young multigravida, third trimester 11/28/2016 by Vena Austria, MD No   Overview Addendum 11/28/2016  5:14 PM by Vena Austria, MD    Clinic [facility] Prenatal Labs [phone]/28/18 [facility])  Dating LMP = [redacted]w[redacted]d [place] (01/08/17 by [place]) Blood type: --/--/A POS (08/19 1353)   Genetic Screen 1 Screen: negative Antibody:NEG (08/19 1353)  Anatomic [place] Normal female  Rubella: Immune Varicella: Unknown (Immune 2017 pregnancy)  GTT Early:115 trimester: 126 RPR: Non Reactive (07/16 1455)   Rhogam  HBsAg:   TDaP vaccine                       Flu Shot: HIV:   [employer]  GBS: Negative 09/14/16  Contraception  Pap: 02/08/2016 NIL  CBB   BMZ 11/26/16 and 11/27/16 with prior course 09/14/16 and 09/15/16  CS/VBAC Repeat Cesarean   Support Person  Hgb AA    Hep C Ab negative       Hx of preeclampsia, prior pregnancy, currently pregnant  11/28/2016 by Vena Austria, MD No   Overview Addendum 11/28/2016  5:15 PM by Vena Austria, MD    NO ASA because of chronic abruption  Baseline and surveillance labs (pulled in from [facility], refresh links as needed)  Lab Results  Component Value Date   PLT 141 (L) 11/25/2016   CREATININE 0.70 04/25/2016   AST 24 04/25/2016   ALT 16 04/25/2016   LABPROT 14.0 11/25/2016         History of cesarean section complicating pregnancy 11/28/2016 by Vena Austria, MD No   Overview Signed 11/28/2016  5:40 PM by Vena Austria, MD    Repeat at 37 weeks scheduled 12/18/16      BMI 40.0-44.9, adult (HCC) 11/21/2016 by Conard Novak, MD No   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 by Percell Locus No   Obesity complicating pregnancy, third trimester 06/04/2016 by Tresea Mall, CNM No   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 by Tresea Mall, CNM No      Preterm labor symptoms and general obstetric precautions including but not limited to vaginal bleeding, contractions, leaking of fluid and fetal movement were reviewed in detail with the patient. Please refer to After Visit Summary for other counseling recommendations.   Return in about 3 days (around 12/06/2016) for NST with routine prenata (may change appt type with JEG), 1 week ROB/NST with u/s for AFI.  Thomasene Mohair, MD  12/03/2016 10:09 AM

## 2016-12-04 ENCOUNTER — Telehealth: Payer: Self-pay

## 2016-12-04 NOTE — Telephone Encounter (Signed)
Pt left msg on triage with c/o pain and pressure during urination. Unsure if UTI but concerned due to other complications and high risk pregnancy.  Left msg for pt to call back. Cb# [phone]

## 2016-12-04 NOTE — Telephone Encounter (Signed)
Pt noticed last night an increase in frequency in urination. Also noticed that after shaving a couple of days ago she is experiencing skin irritation and pressure. Pt would like to have this checked at her next appt on Thursday 12/06/16. Pt voiding adequate amount each time she urinates and feels like she is able to fully empty bladder at this time. Instructed to call back if sxs worsen or persist.

## 2016-12-06 ENCOUNTER — Ambulatory Visit (INDEPENDENT_AMBULATORY_CARE_PROVIDER_SITE_OTHER): Payer: Medicaid Other | Admitting: Advanced Practice Midwife

## 2016-12-06 VITALS — BP 118/76 | Wt 244.0 lb

## 2016-12-06 DIAGNOSIS — O3403 Maternal care for unspecified congenital malformation of uterus, third trimester: Secondary | ICD-10-CM

## 2016-12-06 DIAGNOSIS — B009 Herpesviral infection, unspecified: Secondary | ICD-10-CM

## 2016-12-06 DIAGNOSIS — Z3A35 35 weeks gestation of pregnancy: Secondary | ICD-10-CM

## 2016-12-06 DIAGNOSIS — Q513 Bicornate uterus: Secondary | ICD-10-CM | POA: Diagnosis not present

## 2016-12-06 DIAGNOSIS — O09293 Supervision of pregnancy with other poor reproductive or obstetric history, third trimester: Secondary | ICD-10-CM

## 2016-12-06 NOTE — Progress Notes (Signed)
NST today

## 2016-12-06 NOTE — Progress Notes (Signed)
NST today is reactive. 150 bpm baseline Moderate variability +accelerations -decelerations 20 minute strip  No LOF, VB, Ctx's All future appointments through c/section are scheduled

## 2016-12-08 ENCOUNTER — Encounter: Payer: Self-pay | Admitting: *Deleted

## 2016-12-08 ENCOUNTER — Observation Stay
Admission: EM | Admit: 2016-12-08 | Discharge: 2016-12-08 | Disposition: A | Discharge status: Home / Self Care | Payer: Medicaid Other | Attending: Obstetrics and Gynecology | Admitting: Obstetrics and Gynecology

## 2016-12-08 DIAGNOSIS — Z79899 Other long term (current) drug therapy: Secondary | ICD-10-CM | POA: Diagnosis not present

## 2016-12-08 DIAGNOSIS — Z3A36 36 weeks gestation of pregnancy: Secondary | ICD-10-CM | POA: Insufficient documentation

## 2016-12-08 DIAGNOSIS — O09299 Supervision of pregnancy with other poor reproductive or obstetric history, unspecified trimester: Secondary | ICD-10-CM

## 2016-12-08 DIAGNOSIS — R42 Dizziness and giddiness: Secondary | ICD-10-CM | POA: Insufficient documentation

## 2016-12-08 DIAGNOSIS — B009 Herpesviral infection, unspecified: Secondary | ICD-10-CM

## 2016-12-08 DIAGNOSIS — Z87891 Personal history of nicotine dependence: Secondary | ICD-10-CM | POA: Diagnosis not present

## 2016-12-08 DIAGNOSIS — Q513 Bicornate uterus: Secondary | ICD-10-CM

## 2016-12-08 DIAGNOSIS — O09623 Supervision of young multigravida, third trimester: Secondary | ICD-10-CM

## 2016-12-08 DIAGNOSIS — R1010 Upper abdominal pain, unspecified: Secondary | ICD-10-CM | POA: Diagnosis not present

## 2016-12-08 DIAGNOSIS — O99213 Obesity complicating pregnancy, third trimester: Secondary | ICD-10-CM

## 2016-12-08 DIAGNOSIS — R109 Unspecified abdominal pain: Secondary | ICD-10-CM | POA: Diagnosis not present

## 2016-12-08 DIAGNOSIS — Z888 Allergy status to other drugs, medicaments and biological substances status: Secondary | ICD-10-CM | POA: Diagnosis not present

## 2016-12-08 DIAGNOSIS — O4593 Premature separation of placenta, unspecified, third trimester: Secondary | ICD-10-CM

## 2016-12-08 DIAGNOSIS — R51 Headache: Secondary | ICD-10-CM

## 2016-12-08 DIAGNOSIS — O26893 Other specified pregnancy related conditions, third trimester: Secondary | ICD-10-CM | POA: Diagnosis not present

## 2016-12-08 DIAGNOSIS — Z6841 Body Mass Index (BMI) 40.0 and over, adult: Secondary | ICD-10-CM

## 2016-12-08 DIAGNOSIS — O34219 Maternal care for unspecified type scar from previous cesarean delivery: Secondary | ICD-10-CM

## 2016-12-08 LAB — COMPREHENSIVE METABOLIC PANEL
ALBUMIN: 2.9 g/dL — AB (ref 3.5–5.0)
ALK PHOS: 80 U/L (ref 38–126)
ALT: 10 U/L — AB (ref 14–54)
AST: 16 U/L (ref 15–41)
Anion gap: 7 (ref 5–15)
BILIRUBIN TOTAL: 0.7 mg/dL (ref 0.3–1.2)
BUN: 8 mg/dL (ref 6–20)
CALCIUM: 8.6 mg/dL — AB (ref 8.9–10.3)
CO2: 24 mmol/L (ref 22–32)
CREATININE: 0.67 mg/dL (ref 0.44–1.00)
Chloride: 106 mmol/L (ref 101–111)
GFR calc Af Amer: >60 mL/min (ref >60)
GFR calc non Af Amer: >60 mL/min (ref >60)
GLUCOSE: 102 mg/dL — AB (ref 65–99)
Potassium: 3.7 mmol/L (ref 3.5–5.1)
Sodium: 137 mmol/L (ref 135–145)
TOTAL PROTEIN: 5.9 g/dL — AB (ref 6.5–8.1)

## 2016-12-08 LAB — URINALYSIS, COMPLETE (UACMP) WITH MICROSCOPIC
Bacteria, UA: NONE SEEN
Bilirubin Urine: NEGATIVE
Glucose, UA: NEGATIVE mg/dL
Hgb urine dipstick: NEGATIVE
Ketones, ur: NEGATIVE mg/dL
Nitrite: NEGATIVE
PH: 6 (ref 5.0–8.0)
Protein, ur: NEGATIVE mg/dL
SPECIFIC GRAVITY, URINE: 1.021 (ref 1.005–1.030)

## 2016-12-08 NOTE — Discharge Summary (Signed)
Physician Final Progress Note  Patient ID: Sandy [name] MRN: [id number] DOB/AGE: 1990/12/03 26 y.o.  Admit date: 12/08/2016 Admitting provider: Linzie Collin, MD Discharge date: 12/08/2016   Admission Diagnoses: upper abdominal pain, headache, dizziness  Discharge Diagnoses:  Active Problems:   Indication for care in labor and delivery, antepartum IUP at 36 weeks 0 days with reactive NST, not in labor, moderate leukocytes on UA  History of Present Illness: The patient is a 26 y.o. female G3P2002 at [redacted]w[redacted]d who presents for generally not feeling well today. She has had a dull ache in her upper left abdomen and some cramps in her lower abdomen throughout the day today. She had gone out to dinner tonight and then out shopping when she had a headache and began to feel dizzy and like she might pass out. She admits positive fetal movement. She denies GI symptoms, nausea, vomiting, constipation, diarrhea. She denies s/s of illness fever, chills. She denies urinary s/s no burning or frequency. She denies LOF. She has had a small amount of bleeding in the past few days since her last prenatal visit. She denies contractions. She has not had adequate sleep recently.  She mentioned that the dull ache she is feeling is reminiscent of a time when she had a pancreas attack.   Past Medical History:  Diagnosis Date  . Bicornate uterus   . Complication of anesthesia    itching after C-Sections  . Family history of adverse reaction to anesthesia    uncle has a hard time waking up  . Genital HSV   . GERD (gastroesophageal reflux disease)   . Obesity affecting pregnancy    BMI>40    Past Surgical History:  Procedure Laterality Date  . CESAREAN SECTION     for HSV outbreak  . CESAREAN SECTION N/A 11/29/2015   Procedure: CESAREAN SECTION Baby Girl 7lb.[name];  Surgeon: Vena Austria, MD;  Location: [facility] ORS;  Service: Obstetrics;  Laterality: N/A;  . ears Bilateral    tubes  .  ESOPHAGOGASTRODUODENOSCOPY (EGD) WITH PROPOFOL N/A 05/04/2016   Procedure: ESOPHAGOGASTRODUODENOSCOPY (EGD) WITH PROPOFOL;  Surgeon: Midge Minium, MD;  Location: [facility];  Service: Endoscopy;  Laterality: N/A;  . TONSILLECTOMY      No current facility-administered medications on file prior to encounter.    Current Outpatient Prescriptions on File Prior to Encounter  Medication Sig Dispense Refill  . Prenatal Vit-Fe Fumarate-FA (MULTIVITAMIN-PRENATAL) 27-0.8 MG TABS tablet Take 1 tablet by mouth daily at 12 noon.    . valACYclovir (VALTREX) 500 MG tablet Take 1 tablet (500 mg total) by mouth 2 (two) times daily. (Patient not taking: Reported on 12/08/2016) 60 tablet 2    Allergies  Allergen Reactions  . Oxycodone-Acetaminophen Itching  . Fluticasone Swelling    Mouth swelling  . Percocet [Oxycodone-Acetaminophen] Itching  . Tape     Some tapes cause blisters    Social History   Social History  . Marital status: Legally Separated    Spouse name: N/A  . Number of children: N/A  . Years of education: N/A   Occupational History  . Not on file.   Social History Main Topics  . Smoking status: Former Smoker    Quit date: 04/09/2013  . Smokeless tobacco: Never Used  . Alcohol use Yes     Comment: 2x/month  . Drug use: No  . Sexual activity: Yes   Other Topics Concern  . Not on file   Social History Narrative  . No  narrative on file    Physical Exam: BP (!) 95/56 (BP Location: Left Arm)   Pulse 99   Temp 98.7 F (37.1 C) (Oral)   Resp 16   LMP 03/31/2016   Gen: NAD CV: RRR Pulm: CTAB Pelvic: deferred Toco: negative Fetal Well Being: 135 bpm, moderate variability, +accelerations, -decelerations Ext: no evidence of DVT  Consults: None  Significant Findings/ Diagnostic Studies: labs:  Results for Sandy, [name] (MRN [id number]) as of 12/08/2016 20:20  Ref. Range 12/08/2016 18:44 12/08/2016 18:52  Sodium Latest Ref Range: 135 - 145 mmol/L  137  Potassium  Latest Ref Range: 3.5 - 5.1 mmol/L  3.7  Chloride Latest Ref Range: 101 - 111 mmol/L  106  CO2 Latest Ref Range: 22 - 32 mmol/L  24  Glucose Latest Ref Range: 65 - 99 mg/dL  409 (H)  BUN Latest Ref Range: 6 - 20 mg/dL  8  Creatinine Latest Ref Range: 0.44 - 1.00 mg/dL  8.11  Calcium Latest Ref Range: 8.9 - 10.3 mg/dL  8.6 (L)  Anion gap Latest Ref Range: 5 - 15   7  Alkaline Phosphatase Latest Ref Range: 38 - 126 U/L  80  Albumin Latest Ref Range: 3.5 - 5.0 g/dL  2.9 (L)  AST Latest Ref Range: 15 - 41 U/L  16  ALT Latest Ref Range: 14 - 54 U/L  10 (L)  Total Protein Latest Ref Range: 6.5 - 8.1 g/dL  5.9 (L)  Total Bilirubin Latest Ref Range: 0.3 - 1.2 mg/dL  0.7  GFR, Est Non African American Latest Ref Range: >60 mL/min  >60  GFR, Est African American Latest Ref Range: >60 mL/min  >60  Appearance Latest Ref Range: CLEAR  CLEAR (A)   Bilirubin Urine Latest Ref Range: NEGATIVE  NEGATIVE   Color, Urine Latest Ref Range: YELLOW  YELLOW (A)   Glucose Latest Ref Range: NEGATIVE mg/dL NEGATIVE   Hgb urine dipstick Latest Ref Range: NEGATIVE  NEGATIVE   Ketones, ur Latest Ref Range: NEGATIVE mg/dL NEGATIVE   Leukocytes, UA Latest Ref Range: NEGATIVE  MODERATE (A)   Nitrite Latest Ref Range: NEGATIVE  NEGATIVE   pH Latest Ref Range: 5.0 - 8.0  6.0   Protein Latest Ref Range: NEGATIVE mg/dL NEGATIVE   Specific Gravity, Urine Latest Ref Range: 1.005 - 1.030  1.021   Bacteria, UA Latest Ref Range: NONE SEEN  NONE SEEN   Mucus Unknown PRESENT   RBC / HPF Latest Ref Range: 0 - 5 RBC/hpf 0-5   Squamous Epithelial / LPF Latest Ref Range: NONE SEEN  0-5 (A)   WBC, UA Latest Ref Range: 0 - 5 WBC/hpf 0-5    Urine Culture ordered  Procedures: NST  Discharge Condition: good  Disposition: 01-Home or Self Care  Diet: Regular diet  Discharge Activity: Activity as tolerated  Discharge Instructions    Discharge activity:  No Restrictions    Complete by:  As directed    Discharge diet:  No  restrictions    Complete by:  As directed    Fetal Kick Count:  Lie on our left side for one hour after a meal, and count the number of times your baby kicks.  If it is less than 5 times, get up, move around and drink some juice.  Repeat the test 30 minutes later.  If it is still less than 5 kicks in an hour, notify your doctor.    Complete by:  As directed    LABOR:  When conractions begin, you should start to time them from the beginning of one contraction to the beginning  of the next.  When contractions are 5 - 10 minutes apart or less and have been regular for at least an hour, you should call your health care provider.    Complete by:  As directed    No sexual activity restrictions    Complete by:  As directed    Notify physician for bleeding from the vagina    Complete by:  As directed    Notify physician for blurring of vision or spots before the eyes    Complete by:  As directed    Notify physician for chills or fever    Complete by:  As directed    Notify physician for fainting spells, "black outs" or loss of consciousness    Complete by:  As directed    Notify physician for increase in vaginal discharge    Complete by:  As directed    Notify physician for leaking of fluid    Complete by:  As directed    Notify physician for pain or burning when urinating    Complete by:  As directed    Notify physician for pelvic pressure (sudden increase)    Complete by:  As directed    Notify physician for severe or continued nausea or vomiting    Complete by:  As directed    Notify physician for sudden gushing of fluid from the vagina (with or without continued leaking)    Complete by:  As directed    Notify physician for sudden, constant, or occasional abdominal pain    Complete by:  As directed    Notify physician if baby moving less than usual    Complete by:  As directed      Allergies as of 12/08/2016      Reactions   Oxycodone-acetaminophen Itching   Fluticasone Swelling    Mouth swelling   Percocet [oxycodone-acetaminophen] Itching   Tape    Some tapes cause blisters      Medication List    STOP taking these medications   valACYclovir 500 MG tablet Commonly known as:  VALTREX     TAKE these medications   acetaminophen 325 MG tablet Commonly known as:  TYLENOL Take 650 mg by mouth every 6 (six) hours as needed for mild pain.   multivitamin-prenatal 27-0.8 MG Tabs tablet Take 1 tablet by mouth daily at 12 noon.             Follow-up Information    [facility]. Go to.   Why:  Go to next scheduled appt. Contact information: [address] [place] [phone]          Total time spent taking care of this patient: 30 minutes  Signed: Tresea MallJane Tyshun Tuckerman, CNM  12/08/2016, 8:05 PM

## 2016-12-08 NOTE — OB Triage Note (Signed)
0600 this am pt started with hazy vision, HA, abdominal pain, and dizziness. Pt. CO worsening symptoms throughout the day. HA unrelieved by 650 mg Tylenol. Elaina HoopsElks, Timmy Cleverly S

## 2016-12-08 NOTE — Discharge Instructions (Signed)
Come back if: ° °Big gush of fluids °Decreased fetal movement °Temp over 100.4 °Heavy vaginal bleeding °Contractions every 3-5 min lasting at least one hour ° °Get plenty of rest and stay well hydrated! °

## 2016-12-09 ENCOUNTER — Other Ambulatory Visit: Payer: Self-pay | Admitting: Advanced Practice Midwife

## 2016-12-09 DIAGNOSIS — N390 Urinary tract infection, site not specified: Secondary | ICD-10-CM

## 2016-12-09 MED ORDER — CEPHALEXIN 500 MG PO CAPS: 500.0000 mg | ORAL_CAPSULE | Freq: Two times a day (BID) | ORAL | 0 refills | Status: DC

## 2016-12-09 NOTE — Progress Notes (Signed)
S: patient was seen in triage on L&D on 12/08/2016 for abdominal pain. Labs were normal except for leukocytes in UA. Spoke with patient this morning. She says her headache is better but still feels some of the upper abdomen pain.  O: Results for Sandy LeaderSAUL, Sandy [name] (MRN [id number]) as of 12/09/2016 12:22  Ref. Range 12/08/2016 18:44  Appearance Latest Ref Range: CLEAR  CLEAR (A)  Bilirubin Urine Latest Ref Range: NEGATIVE  NEGATIVE  Color, Urine Latest Ref Range: YELLOW  YELLOW (A)  Glucose Latest Ref Range: NEGATIVE mg/dL NEGATIVE  Hgb urine dipstick Latest Ref Range: NEGATIVE  NEGATIVE  Ketones, ur Latest Ref Range: NEGATIVE mg/dL NEGATIVE  Leukocytes, UA Latest Ref Range: NEGATIVE  MODERATE (A)  Nitrite Latest Ref Range: NEGATIVE  NEGATIVE  pH Latest Ref Range: 5.0 - 8.0  6.0  Protein Latest Ref Range: NEGATIVE mg/dL NEGATIVE  Specific Gravity, Urine Latest Ref Range: 1.005 - 1.030  1.021  Bacteria, UA Latest Ref Range: NONE SEEN  NONE SEEN  Mucus Unknown PRESENT  RBC / HPF Latest Ref Range: 0 - 5 RBC/hpf 0-5  Squamous Epithelial / LPF Latest Ref Range: NONE SEEN  0-5 (A)  WBC, UA Latest Ref Range: 0 - 5 WBC/hpf 0-5   A: 26 yo female with acute uti P: Rx for Keflex sent to patient pharmacy

## 2016-12-10 LAB — URINE CULTURE

## 2016-12-11 ENCOUNTER — Ambulatory Visit (INDEPENDENT_AMBULATORY_CARE_PROVIDER_SITE_OTHER): Payer: Self-pay

## 2016-12-11 ENCOUNTER — Telehealth: Payer: Self-pay

## 2016-12-11 ENCOUNTER — Ambulatory Visit (INDEPENDENT_AMBULATORY_CARE_PROVIDER_SITE_OTHER): Payer: Self-pay | Admitting: Advanced Practice Midwife

## 2016-12-11 VITALS — BP 124/78 | Wt 248.0 lb

## 2016-12-11 DIAGNOSIS — Z3A36 36 weeks gestation of pregnancy: Secondary | ICD-10-CM

## 2016-12-11 DIAGNOSIS — O09623 Supervision of young multigravida, third trimester: Secondary | ICD-10-CM

## 2016-12-11 DIAGNOSIS — O99213 Obesity complicating pregnancy, third trimester: Secondary | ICD-10-CM

## 2016-12-11 DIAGNOSIS — Z3685 Encounter for antenatal screening for Streptococcus B: Secondary | ICD-10-CM

## 2016-12-11 DIAGNOSIS — Z113 Encounter for screening for infections with a predominantly sexual mode of transmission: Secondary | ICD-10-CM

## 2016-12-11 DIAGNOSIS — O4593 Premature separation of placenta, unspecified, third trimester: Secondary | ICD-10-CM

## 2016-12-11 NOTE — Progress Notes (Signed)
Still having some stomach upset and headaches when eating. Otherwise feeling well. No Ctx's, LOF, VB, pelvic pain or burning/irritation.  Growth today is: 5#1oz, < 10%, FL, BPD, HC all at < 2.3% and AC at 8%, AFI is 10.49 today NST is reactive with 150 bpm baseline, moderate variability, +accelerations, -decelerations Consult with Dr Jean RosenthalJackson regarding plan of care: he suggests Umbilical artery doppler later this week. Patient aware and will schedule appointment for UA doppler study.

## 2016-12-11 NOTE — Progress Notes (Signed)
Gbs/aptima today 

## 2016-12-11 NOTE — Telephone Encounter (Signed)
Pt called after hour nurse 12/08/16 4:42pm c/o 36wks, experiencing dizziness, h/a, stomach cramping, feels like hot flashes.  Sxs started that am.  Was adv to go to L&D which she did, was evaluated and sent home c instructions to keep routine app.

## 2016-12-12 ENCOUNTER — Ambulatory Visit (INDEPENDENT_AMBULATORY_CARE_PROVIDER_SITE_OTHER): Payer: Medicaid Other

## 2016-12-12 ENCOUNTER — Telehealth: Payer: Self-pay

## 2016-12-12 DIAGNOSIS — O09623 Supervision of young multigravida, third trimester: Secondary | ICD-10-CM

## 2016-12-12 LAB — GC/CHLAMYDIA PROBE AMP
CHLAMYDIA, DNA PROBE: NEGATIVE
Neisseria gonorrhoeae by PCR: NEGATIVE

## 2016-12-12 NOTE — Telephone Encounter (Signed)
Pt came in for doppler studies and is supposed to be called with those results to see if it changes anything about her c-section on Tues.  Also, what are the side effects of her UTI med?  She is experiencing dizziness, feels like swelling, h/a, stomach hurts, not comfortable until she is completely laid down and even then her head is still spinning.   Are these side effects of the med or other reason like placenta or something?  Please call.  [phone]

## 2016-12-12 NOTE — Telephone Encounter (Signed)
Called patient and answered questions regarding ultrasound.  UA dopplers normal. Will keep c-section for 9/11.  Pt encouraged to keep all appts and keep up with fetal movement.

## 2016-12-13 LAB — STREP GP B NAA: STREP GROUP B AG: NEGATIVE

## 2016-12-14 ENCOUNTER — Ambulatory Visit (INDEPENDENT_AMBULATORY_CARE_PROVIDER_SITE_OTHER): Payer: Medicaid Other | Admitting: Advanced Practice Midwife

## 2016-12-14 VITALS — BP 110/60 | Wt 244.0 lb

## 2016-12-14 DIAGNOSIS — O4593 Premature separation of placenta, unspecified, third trimester: Secondary | ICD-10-CM

## 2016-12-14 DIAGNOSIS — Z3A36 36 weeks gestation of pregnancy: Secondary | ICD-10-CM

## 2016-12-14 DIAGNOSIS — W1831XA Fall on same level due to stepping on an object, initial encounter: Secondary | ICD-10-CM

## 2016-12-14 DIAGNOSIS — O9A213 Injury, poisoning and certain other consequences of external causes complicating pregnancy, third trimester: Secondary | ICD-10-CM | POA: Diagnosis not present

## 2016-12-14 NOTE — Progress Notes (Signed)
Pt tripped on a toy last night, landed on knees, they are fine, baby is moving, did have a little bit of spotting this morning but also has placenta abruption, pressure in pelvic bone area, lower back pain.

## 2016-12-14 NOTE — Progress Notes (Signed)
NST reactive today in 20 minutes but stayed on longer due to not being able to get back to the room sooner.  150 bpm baseline, moderate variability, +accelerations, -decelerations Encouraged to have a restful weekend and to be safe! No LOF or VB. Has pre op and H&P on Monday. C/S on Tuesday.

## 2016-12-17 ENCOUNTER — Ambulatory Visit (INDEPENDENT_AMBULATORY_CARE_PROVIDER_SITE_OTHER): Payer: Medicaid Other | Admitting: Obstetrics and Gynecology

## 2016-12-17 ENCOUNTER — Encounter: Payer: Self-pay | Admitting: Obstetrics and Gynecology

## 2016-12-17 ENCOUNTER — Encounter
Admission: RE | Admit: 2016-12-17 | Discharge: 2016-12-17 | Disposition: A | Discharge status: Home / Self Care | Payer: Medicaid Other | Source: Ambulatory Visit | Attending: Obstetrics and Gynecology | Admitting: Obstetrics and Gynecology

## 2016-12-17 VITALS — BP 110/70 | HR 91 | Ht 65.0 in | Wt 247.0 lb

## 2016-12-17 DIAGNOSIS — O4593 Premature separation of placenta, unspecified, third trimester: Secondary | ICD-10-CM

## 2016-12-17 DIAGNOSIS — O99213 Obesity complicating pregnancy, third trimester: Secondary | ICD-10-CM | POA: Diagnosis not present

## 2016-12-17 DIAGNOSIS — O09299 Supervision of pregnancy with other poor reproductive or obstetric history, unspecified trimester: Secondary | ICD-10-CM

## 2016-12-17 DIAGNOSIS — Z3A37 37 weeks gestation of pregnancy: Secondary | ICD-10-CM

## 2016-12-17 DIAGNOSIS — B009 Herpesviral infection, unspecified: Secondary | ICD-10-CM

## 2016-12-17 DIAGNOSIS — O34219 Maternal care for unspecified type scar from previous cesarean delivery: Secondary | ICD-10-CM

## 2016-12-17 DIAGNOSIS — O09623 Supervision of young multigravida, third trimester: Secondary | ICD-10-CM

## 2016-12-17 DIAGNOSIS — Z6841 Body Mass Index (BMI) 40.0 and over, adult: Secondary | ICD-10-CM

## 2016-12-17 LAB — CBC
HCT: 36.9 % (ref 35.0–47.0)
HEMOGLOBIN: 13.1 g/dL (ref 12.0–16.0)
MCH: 30.8 pg (ref 26.0–34.0)
MCHC: 35.3 g/dL (ref 32.0–36.0)
MCV: 87 fL (ref 80.0–100.0)
Platelets: 134 10*3/uL — ABNORMAL LOW (ref 150–440)
RBC: 4.25 MIL/uL (ref 3.80–5.20)
RDW: 16.2 % — ABNORMAL HIGH (ref 11.5–14.5)
WBC: 6.8 10*3/uL (ref 3.6–11.0)

## 2016-12-17 LAB — RAPID HIV SCREEN (HIV 1/2 AB+AG)
HIV 1/2 ANTIBODIES: NONREACTIVE
HIV-1 P24 ANTIGEN - HIV24: NONREACTIVE

## 2016-12-17 LAB — TYPE AND SCREEN
ABO/RH(D): A POS
ANTIBODY SCREEN: NEGATIVE
Extend sample reason: UNDETERMINED

## 2016-12-17 MED ORDER — CEFAZOLIN SODIUM 10 G IJ SOLR
3.0000 g | INTRAMUSCULAR | Status: AC
  Administered 2016-12-18: 3 g via INTRAVENOUS
  Filled 2016-12-17: qty 3000

## 2016-12-17 NOTE — Progress Notes (Signed)
  OB History & Physical   History of Present Illness:  Chief Complaint: preoperative visit for cesarean delivery  HPI:  Sandy [name] is a 26 y.o. G3P2002 female at [redacted]w[redacted]d dated by LMP and early ultrasuond.  Her pregnancy has been complicated by obesity with BMI > 40, history of cesarean delivery, history of placenta abruption, history of preeclampsia, history of genital herpes.  She had a growth ultrasound nearly three weeks ago that showed borderline growth (11th %ile).   She denies contractions.   She denies leakage of fluid.   She notes her usual vaginal bleeding (spotting).   She reports fetal movement.    Maternal Medical History:   Past Medical History:  Diagnosis Date  . Bicornate uterus   . Complication of anesthesia    itching after C-Sections  . Family history of adverse reaction to anesthesia    uncle has a hard time waking up  . Genital HSV   . GERD (gastroesophageal reflux disease)   . Obesity affecting pregnancy    BMI>40    Past Surgical History:  Procedure Laterality Date  . CESAREAN SECTION  2012   for HSV outbreak  . CESAREAN SECTION N/A 11/29/2015   Procedure: CESAREAN SECTION Baby Girl 7lb., 6oz.;  Surgeon: Andreas Staebler, MD;  Location: [facility];  Service: Obstetrics;  Laterality: N/A;  . ears Bilateral    tubes  . ESOPHAGOGASTRODUODENOSCOPY (EGD) WITH PROPOFOL N/A 05/04/2016   Procedure: ESOPHAGOGASTRODUODENOSCOPY (EGD) WITH PROPOFOL;  Surgeon: Darren Wohl, MD;  Location: [facility];  Service: Endoscopy;  Laterality: N/A;  . TONSILLECTOMY      Allergies  Allergen Reactions  . Oxycodone-Acetaminophen Itching  . Fluticasone Swelling    Mouth swelling  . Percocet [Oxycodone-Acetaminophen] Itching  . Tape     Some tapes cause blisters    Prior to Admission medications   Medication Sig Start Date End Date Taking? Authorizing Provider  Prenatal Vit-Fe Fumarate-FA (MULTIVITAMIN-PRENATAL) 27-0.8 MG TABS tablet Take 1 tablet by mouth daily  at 12 noon.   Yes [provider]    OB History  Gravida Para Term Preterm AB Living  3 2 2     2  SAB TAB Ectopic Multiple Live Births        0 2    # Outcome Date GA Lbr Len/2nd Weight Sex Delivery Anes PTL Lv  3 Current           2 Term 11/29/15 [redacted]w[redacted]d  7 lb 6.2 oz (3.35 kg) F CS-LVertical Spinal  LIV  1 Term 06/12/10    M CS-LTranv   LIV      Prenatal care site: [facility]  Social History: She  reports that she quit smoking about 3 years ago. She has never used smokeless tobacco. She reports that she drinks alcohol. She reports that she does not use drugs.  Family History: family history includes Diabetes in her maternal aunt; Hyperlipidemia in her maternal aunt.   Review of Systems: Negative x 10 systems reviewed except as noted in the HPI.    Physical Exam:  Vital Signs: BP 110/70 (BP Location: Left Arm, Patient Position: Sitting, Cuff Size: Large)   Pulse 91   Ht 5' 5" (1.651 m)   Wt 247 lb (112 kg)   LMP 03/31/2016   BMI 41.10 kg/m  Constitutional: Well nourished, well developed female in no acute distress.  HEENT: normal Skin: Warm and dry.  Cardiovascular: Regular rate and rhythm.   Extremity: no edema    Respiratory: Clear to auscultation bilateral. Normal respiratory effort Abdomen: FHT present and gravid, NT Back: no CVAT Neuro: DTRs 2+, Cranial nerves grossly intact Psych: Alert and Oriented x3. No memory deficits. Normal mood and affect.  MS: normal gait, normal bilateral lower extremity ROM/strength/stability.  NST Procedure:  Baseline FHR: 145 beats/min Variability: moderate Accelerations: present Decelerations: absent Tocometry: not performed  Interpretation:  INDICATIONS: vaginal bleeding, obesity RESULTS:  A NST procedure was performed with FHR monitoring and a normal baseline established, appropriate time of 20-40 minutes of evaluation, and accels >2 seen w 15x15 characteristics.  Results show a REACTIVE NST.    Assessment:    Sandy [name] is a 26 y.o. 703P2002 female at 406w2d with history of cesarean delivery, desires repeat. Early delivery per [facility] due to placental dysfunction this pregnancy.    Plan:  1. Admit to Labor & Delivery for repeat c-section 2. Orders placed, consents signed.   Thomasene MohairStephen Twanisha Foulk, MD 12/17/2016 8:54 AM

## 2016-12-17 NOTE — Patient Instructions (Addendum)
  Your procedure is scheduled on:Tomorrow Sept 11 , 2018. Enter hospital through Emergency Room at 5:30 am.   Remember: Instructions that are not followed completely may result in serious medical risk, up to and including death, or upon the discretion of your surgeon and anesthesiologist your surgery may need to be rescheduled.    _x___ 1. Do not eat food or drink liquids after midnight. No gum chewing or hard candies.     ____ 2. No Alcohol for 24 hours before or after surgery.   ____ 3. Bring all medications with you on the day of surgery if instructed.    __x__ 4. Notify your doctor if there is any change in your medical condition     (cold, fever, infections).    _____ 5. No smoking 24 hours prior to surgery.     Do not wear jewelry, make-up, hairpins, clips or nail polish.  Do not wear lotions, powders, or perfumes.   Do not shave 48 hours prior to surgery. Men may shave face and neck.  Do not bring valuables to the hospital.    [facility] for any belongings or valuables.               Contacts, dentures or bridgework may not be worn into surgery.  Leave your suitcase in the car. After surgery it may be brought to your room.  For patients admitted to the hospital, discharge time is determined by your treatment team.   Patients discharged the day of surgery will not be allowed to drive home.    Please read over the following fact sheets that you were given:   [facility]  ____ Take these medicines the morning of surgery with A SIP OF WATER: NONE    ____ Fleet Enema (as directed)   _x___ Use CHG Soap as directed on instruction sheet  ____ Use inhalers on the day of surgery and bring to hospital day of surgery  ____ Stop metformin 2 days prior to surgery    ____ Take 1/2 of usual insulin dose the night before surgery and none on the morning of  surgery.   ____ Stop Coumadin/Plavix/aspirin on does not apply.  ____ Stop  Anti-inflammatories such as Advil, Aleve, Ibuprofen, Motrin, Naproxen,  Naprosyn, Goodies powders or aspirin products. OK to take Tylenol.   ____ Stop supplements until after surgery.    ____ Bring C-Pap to the hospital.

## 2016-12-18 ENCOUNTER — Encounter
Admission: RE | Disposition: A | Discharge status: Home / Self Care | Payer: Self-pay | Source: Ambulatory Visit | Attending: Obstetrics and Gynecology

## 2016-12-18 ENCOUNTER — Inpatient Hospital Stay: Payer: Medicaid Other | Admitting: Registered Nurse

## 2016-12-18 ENCOUNTER — Encounter: Payer: Self-pay | Admitting: Anesthesiology

## 2016-12-18 ENCOUNTER — Inpatient Hospital Stay
Admission: RE | Admit: 2016-12-18 | Discharge: 2016-12-22 | DRG: 765 | Disposition: A | Discharge status: Home / Self Care | Payer: Medicaid Other | Source: Ambulatory Visit | Attending: Obstetrics and Gynecology | Admitting: Obstetrics and Gynecology

## 2016-12-18 DIAGNOSIS — O99214 Obesity complicating childbirth: Secondary | ICD-10-CM | POA: Diagnosis present

## 2016-12-18 DIAGNOSIS — Z3A37 37 weeks gestation of pregnancy: Secondary | ICD-10-CM

## 2016-12-18 DIAGNOSIS — D62 Acute posthemorrhagic anemia: Secondary | ICD-10-CM | POA: Diagnosis not present

## 2016-12-18 DIAGNOSIS — B009 Herpesviral infection, unspecified: Secondary | ICD-10-CM | POA: Diagnosis present

## 2016-12-18 DIAGNOSIS — Z98891 History of uterine scar from previous surgery: Secondary | ICD-10-CM

## 2016-12-18 DIAGNOSIS — O3403 Maternal care for unspecified congenital malformation of uterus, third trimester: Secondary | ICD-10-CM | POA: Diagnosis present

## 2016-12-18 DIAGNOSIS — O4593 Premature separation of placenta, unspecified, third trimester: Secondary | ICD-10-CM | POA: Diagnosis present

## 2016-12-18 DIAGNOSIS — Z87891 Personal history of nicotine dependence: Secondary | ICD-10-CM

## 2016-12-18 DIAGNOSIS — O34211 Maternal care for low transverse scar from previous cesarean delivery: Secondary | ICD-10-CM | POA: Diagnosis present

## 2016-12-18 DIAGNOSIS — O34219 Maternal care for unspecified type scar from previous cesarean delivery: Secondary | ICD-10-CM | POA: Diagnosis present

## 2016-12-18 DIAGNOSIS — E669 Obesity, unspecified: Secondary | ICD-10-CM | POA: Diagnosis present

## 2016-12-18 DIAGNOSIS — O9081 Anemia of the puerperium: Secondary | ICD-10-CM | POA: Diagnosis not present

## 2016-12-18 DIAGNOSIS — Z6841 Body Mass Index (BMI) 40.0 and over, adult: Secondary | ICD-10-CM

## 2016-12-18 DIAGNOSIS — O99213 Obesity complicating pregnancy, third trimester: Secondary | ICD-10-CM | POA: Diagnosis present

## 2016-12-18 DIAGNOSIS — Q513 Bicornate uterus: Secondary | ICD-10-CM

## 2016-12-18 DIAGNOSIS — O09623 Supervision of young multigravida, third trimester: Secondary | ICD-10-CM

## 2016-12-18 LAB — RPR: RPR: NONREACTIVE

## 2016-12-18 IMAGING — DX DG NECK SOFT TISSUE
2 series · 2 of 2 positions shown · non-contrast
Comparison: None.

CLINICAL DATA: One week history of sore throat and dysphasia

EXAM:
NECK SOFT TISSUES - 1+ VIEW

[neck lat]
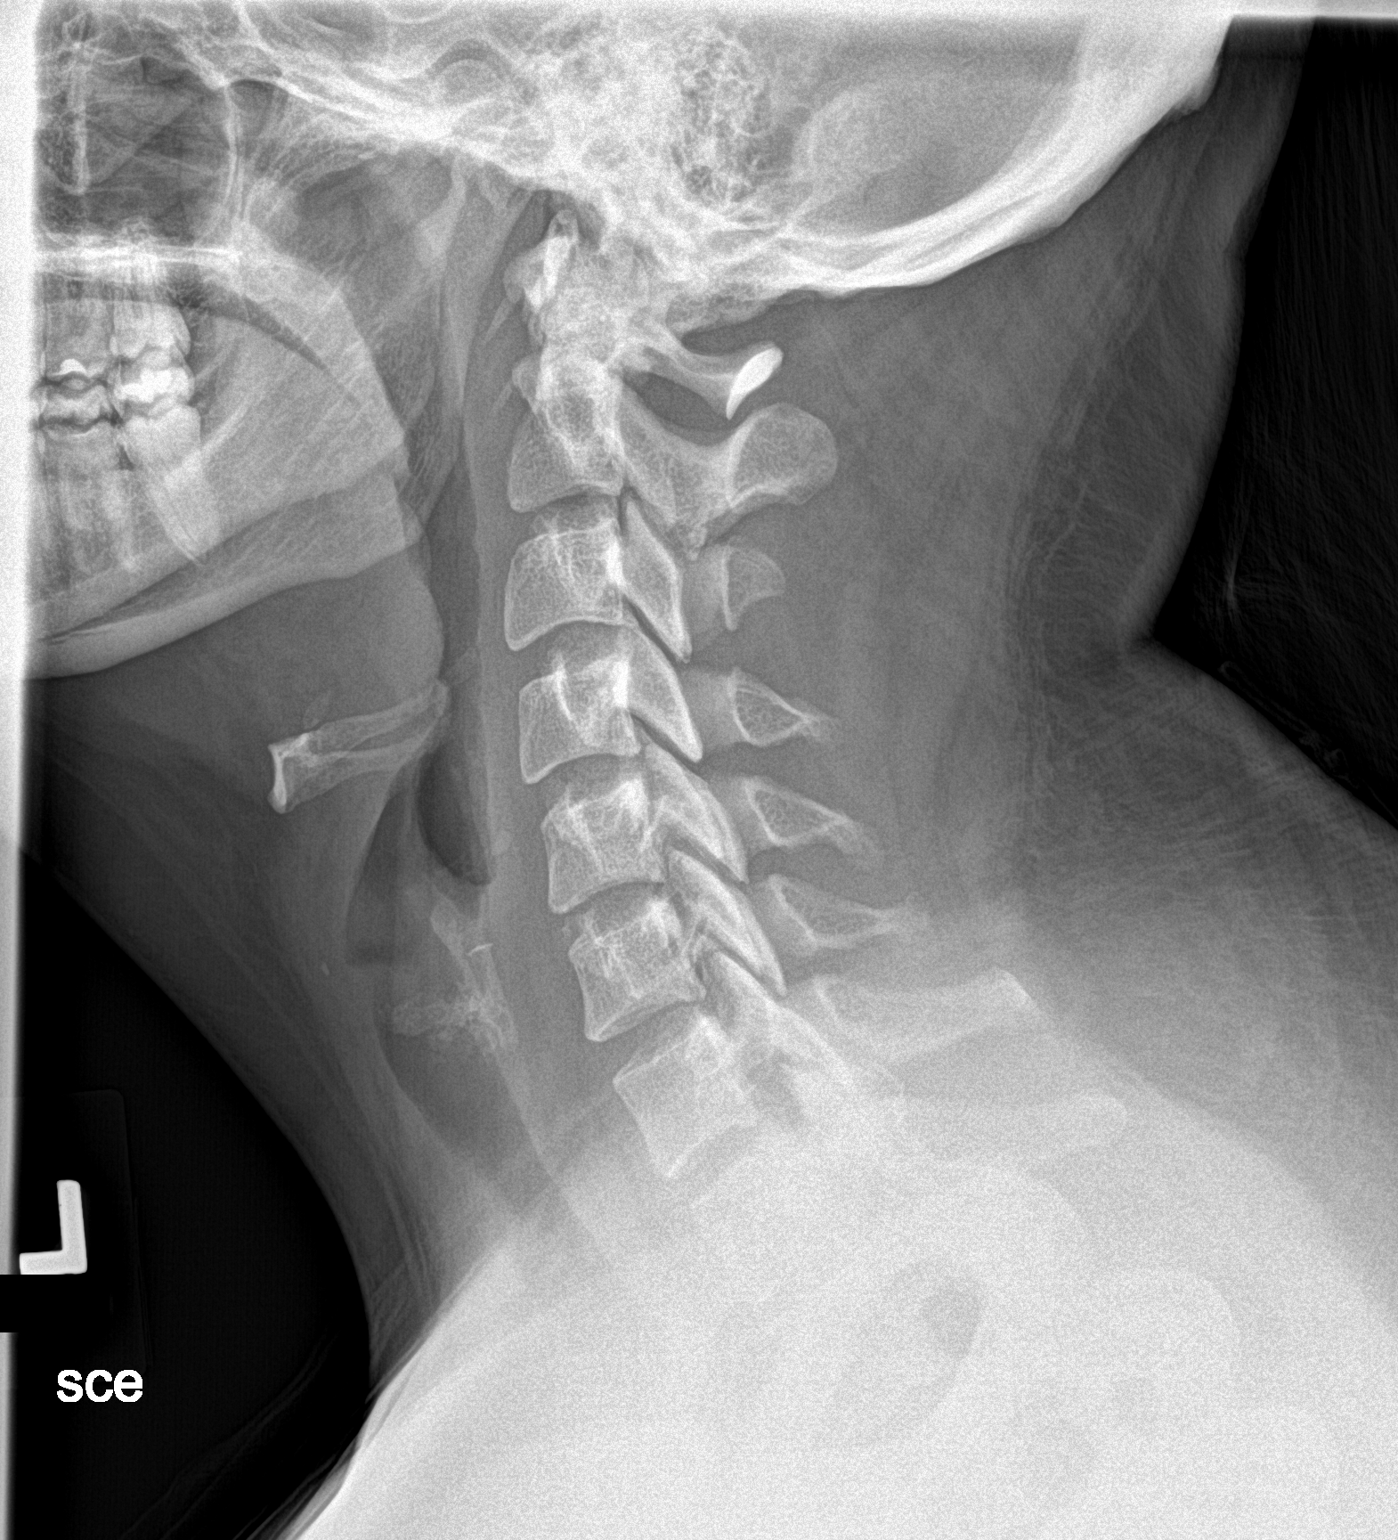

[neck ap]
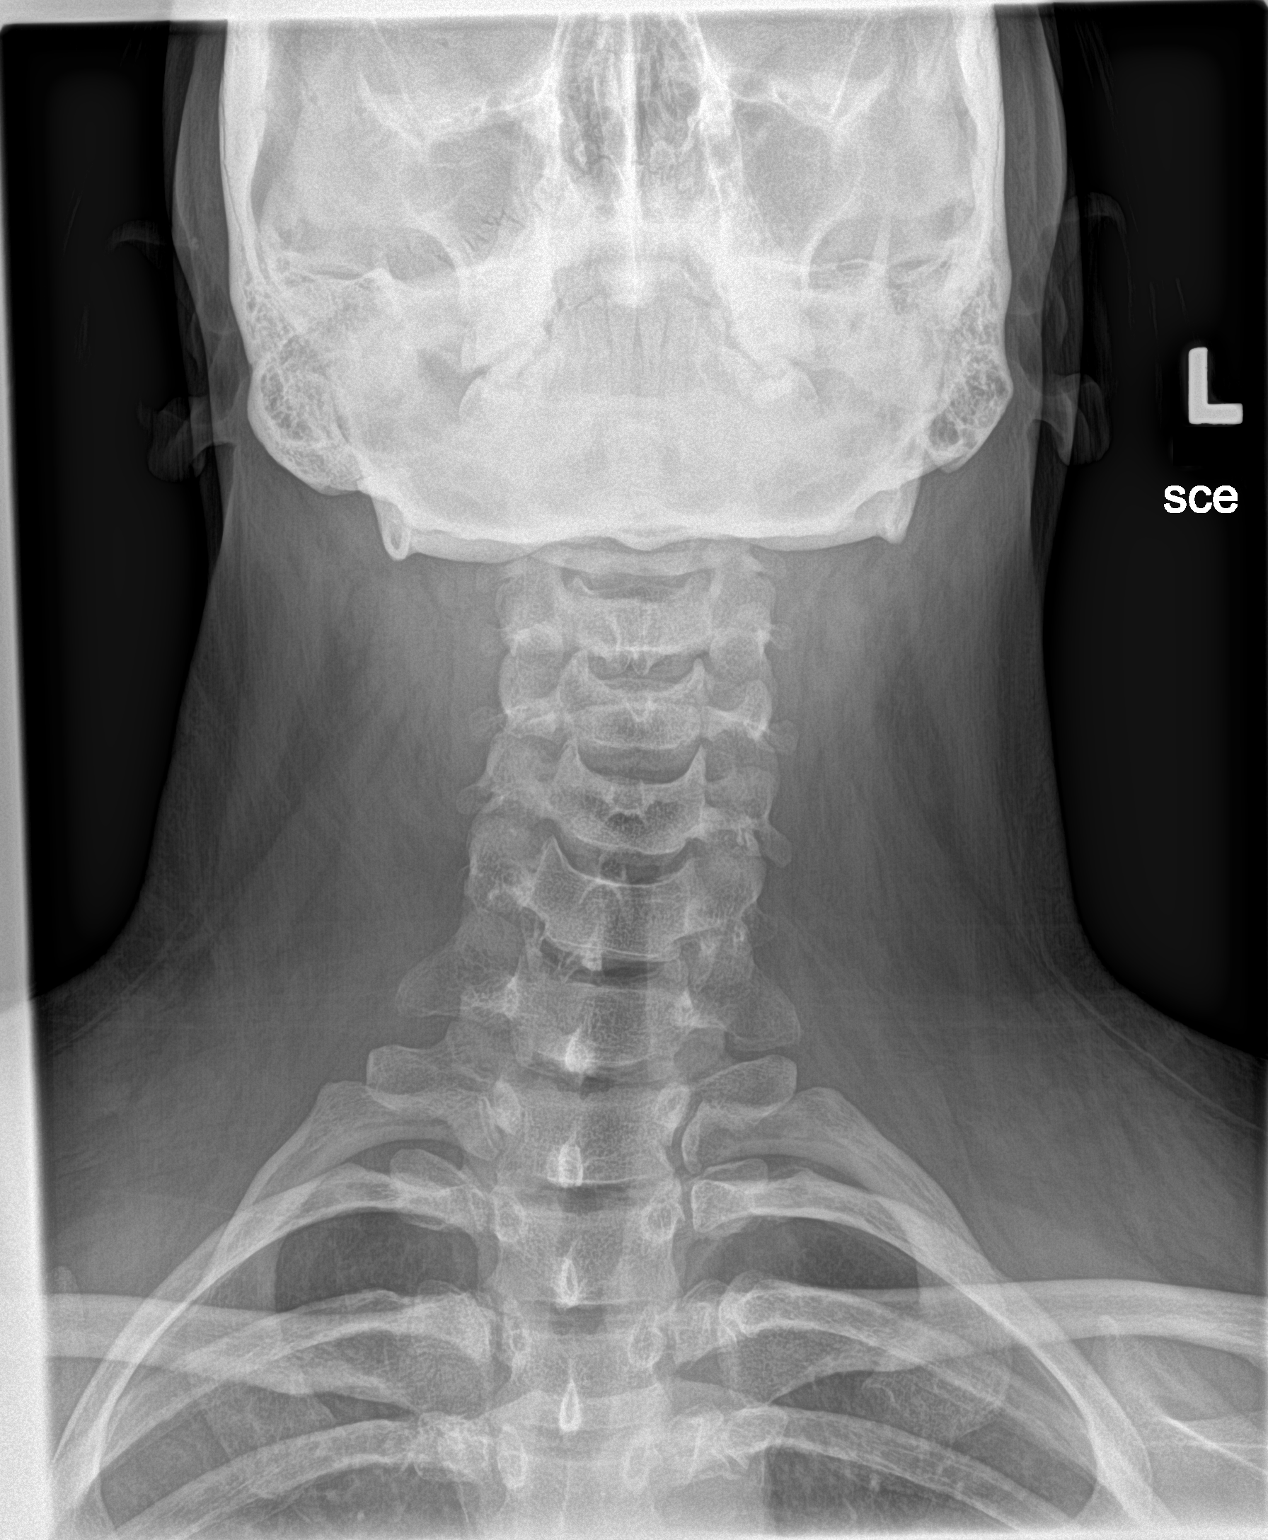

[2 of 2 positions shown; findings below may reference images not displayed]

FINDINGS: Frontal and lateral views were obtained. The epiglottis and
aryepiglottic folds appear normal. Prevertebral soft tissues are
normal. No air-fluid level to suggest abscess. At the level of the
tongue base, there is mild edema causing mild impression on the
upper pharyngeal air column. No high-grade narrowing seen.
IMPRESSION: Mild edema in the tongue base region. Epiglottis and aryepiglottic
folds appear normal. No air-fluid level.

## 2016-12-18 SURGERY — Surgical Case
Anesthesia: Spinal

## 2016-12-18 MED ORDER — KETOROLAC TROMETHAMINE 30 MG/ML IJ SOLN
30.0000 mg | Freq: Four times a day (QID) | INTRAMUSCULAR | Status: DC | PRN
  Administered 2016-12-18 (×2): 30 mg via INTRAVENOUS
  Filled 2016-12-18 (×2): qty 1

## 2016-12-18 MED ORDER — BREAST MILK
ORAL | Status: DC
  Filled 2016-12-18 (×25): qty 1

## 2016-12-18 MED ORDER — NALBUPHINE HCL 10 MG/ML IJ SOLN: 5.0000 mg | INTRAMUSCULAR | Status: DC | PRN

## 2016-12-18 MED ORDER — PRENATAL MULTIVITAMIN CH
1.0000 | ORAL_TABLET | Freq: Every day | ORAL | Status: DC
Start: 2016-12-18 — End: 2016-12-18

## 2016-12-18 MED ORDER — SIMETHICONE 80 MG PO CHEW
80.0000 mg | CHEWABLE_TABLET | Freq: Three times a day (TID) | ORAL | Status: DC
  Administered 2016-12-18 – 2016-12-22 (×13): 80 mg via ORAL
  Filled 2016-12-18 (×13): qty 1

## 2016-12-18 MED ORDER — BUPIVACAINE IN DEXTROSE 0.75-8.25 % IT SOLN
INTRATHECAL | Status: DC | PRN
  Administered 2016-12-18: 1.6 mL via INTRATHECAL

## 2016-12-18 MED ORDER — NALBUPHINE HCL 10 MG/ML IJ SOLN
5.0000 mg | Freq: Once | INTRAMUSCULAR | Status: DC | PRN
  Filled 2016-12-18: qty 1

## 2016-12-18 MED ORDER — BUPIVACAINE IN DEXTROSE 0.75-8.25 % IT SOLN
INTRATHECAL | Status: AC
  Filled 2016-12-18: qty 2

## 2016-12-18 MED ORDER — LACTATED RINGERS IV SOLN
INTRAVENOUS | Status: DC
  Administered 2016-12-18: 07:00:00 via INTRAVENOUS

## 2016-12-18 MED ORDER — MIDAZOLAM HCL 2 MG/2ML IJ SOLN
INTRAMUSCULAR | Status: DC | PRN
  Administered 2016-12-18 (×2): 1 mg via INTRAVENOUS

## 2016-12-18 MED ORDER — DIPHENHYDRAMINE HCL 25 MG PO CAPS: 25.0000 mg | ORAL_CAPSULE | ORAL | Status: DC | PRN

## 2016-12-18 MED ORDER — OXYCODONE HCL 5 MG PO TABS
5.0000 mg | ORAL_TABLET | ORAL | Status: DC | PRN
  Administered 2016-12-18: 5 mg via ORAL
  Filled 2016-12-18: qty 1

## 2016-12-18 MED ORDER — FENTANYL CITRATE (PF) 100 MCG/2ML IJ SOLN
INTRAMUSCULAR | Status: AC
  Filled 2016-12-18: qty 2

## 2016-12-18 MED ORDER — MORPHINE SULFATE (PF) 0.5 MG/ML IJ SOLN
INTRAMUSCULAR | Status: AC
  Filled 2016-12-18: qty 10

## 2016-12-18 MED ORDER — PHENYLEPHRINE HCL 10 MG/ML IJ SOLN
INTRAMUSCULAR | Status: DC | PRN
  Administered 2016-12-18: 20 ug/min via INTRAVENOUS

## 2016-12-18 MED ORDER — NALOXONE HCL 2 MG/2ML IJ SOSY
1.0000 ug/kg/h | PREFILLED_SYRINGE | INTRAVENOUS | Status: DC | PRN
  Filled 2016-12-18: qty 2

## 2016-12-18 MED ORDER — ONDANSETRON HCL 4 MG/2ML IJ SOLN
INTRAMUSCULAR | Status: DC | PRN
  Administered 2016-12-18: 4 mg via INTRAVENOUS

## 2016-12-18 MED ORDER — MIDAZOLAM HCL 2 MG/2ML IJ SOLN
INTRAMUSCULAR | Status: AC
  Filled 2016-12-18: qty 2

## 2016-12-18 MED ORDER — HYDROCODONE-ACETAMINOPHEN 5-325 MG PO TABS
2.0000 | ORAL_TABLET | ORAL | Status: DC | PRN
Start: 2016-12-19 — End: 2016-12-19

## 2016-12-18 MED ORDER — FENTANYL CITRATE (PF) 100 MCG/2ML IJ SOLN: 25.0000 ug | INTRAMUSCULAR | Status: DC | PRN

## 2016-12-18 MED ORDER — BUPIVACAINE HCL (PF) 0.5 % IJ SOLN
5.0000 mL | Freq: Once | INTRAMUSCULAR | Status: DC
  Filled 2016-12-18: qty 30

## 2016-12-18 MED ORDER — ONDANSETRON HCL 4 MG/2ML IJ SOLN: 4.0000 mg | Freq: Three times a day (TID) | INTRAMUSCULAR | Status: DC | PRN

## 2016-12-18 MED ORDER — OXYTOCIN 40 UNITS IN LACTATED RINGERS INFUSION - SIMPLE MED
INTRAVENOUS | Status: AC
  Filled 2016-12-18: qty 1000

## 2016-12-18 MED ORDER — LACTATED RINGERS IV SOLN
INTRAVENOUS | Status: DC
  Administered 2016-12-18 – 2016-12-19 (×2): via INTRAVENOUS

## 2016-12-18 MED ORDER — LACTATED RINGERS IV SOLN
INTRAVENOUS | Status: DC
  Administered 2016-12-18: 06:00:00 via INTRAVENOUS

## 2016-12-18 MED ORDER — DIPHENHYDRAMINE HCL 50 MG/ML IJ SOLN: 12.5000 mg | INTRAMUSCULAR | Status: DC | PRN

## 2016-12-18 MED ORDER — BUPIVACAINE HCL (PF) 0.5 % IJ SOLN: 5.0000 mL | Freq: Once | INTRAMUSCULAR | Status: DC

## 2016-12-18 MED ORDER — MORPHINE SULFATE (PF) 0.5 MG/ML IJ SOLN
INTRAMUSCULAR | Status: DC | PRN
  Administered 2016-12-18: .2 mg via EPIDURAL

## 2016-12-18 MED ORDER — COCONUT OIL OIL
1.0000 "application " | TOPICAL_OIL | Status: DC | PRN
  Administered 2016-12-18 – 2016-12-20 (×2): 1 via TOPICAL
  Filled 2016-12-18 (×2): qty 120

## 2016-12-18 MED ORDER — HYDROCODONE-ACETAMINOPHEN 5-325 MG PO TABS
1.0000 | ORAL_TABLET | ORAL | Status: DC | PRN
Start: 2016-12-19 — End: 2016-12-19

## 2016-12-18 MED ORDER — DIPHENHYDRAMINE HCL 50 MG/ML IJ SOLN
INTRAMUSCULAR | Status: AC
  Administered 2016-12-18: 12.5 mg via INTRAVENOUS
  Filled 2016-12-18: qty 1

## 2016-12-18 MED ORDER — PHENYLEPHRINE HCL 10 MG/ML IJ SOLN
INTRAMUSCULAR | Status: AC
  Filled 2016-12-18: qty 1

## 2016-12-18 MED ORDER — NALBUPHINE HCL 10 MG/ML IJ SOLN: 5.0000 mg | Freq: Once | INTRAMUSCULAR | Status: DC | PRN

## 2016-12-18 MED ORDER — KETOROLAC TROMETHAMINE 30 MG/ML IJ SOLN: 30.0000 mg | Freq: Four times a day (QID) | INTRAMUSCULAR | Status: DC | PRN

## 2016-12-18 MED ORDER — NALBUPHINE HCL 10 MG/ML IJ SOLN
5.0000 mg | INTRAMUSCULAR | Status: DC | PRN
  Administered 2016-12-18 – 2016-12-19 (×3): 5 mg via INTRAVENOUS
  Filled 2016-12-18 (×3): qty 1

## 2016-12-18 MED ORDER — WITCH HAZEL-GLYCERIN EX PADS: 1.0000 "application " | MEDICATED_PAD | CUTANEOUS | Status: DC | PRN

## 2016-12-18 MED ORDER — BUPIVACAINE HCL 0.5 % IJ SOLN
INTRAMUSCULAR | Status: DC | PRN
  Administered 2016-12-18: 5 mL

## 2016-12-18 MED ORDER — EPHEDRINE SULFATE 50 MG/ML IJ SOLN
INTRAMUSCULAR | Status: AC
  Filled 2016-12-18: qty 1

## 2016-12-18 MED ORDER — DIBUCAINE 1 % RE OINT: 1.0000 "application " | TOPICAL_OINTMENT | RECTAL | Status: DC | PRN

## 2016-12-18 MED ORDER — ONDANSETRON HCL 4 MG/2ML IJ SOLN: 4.0000 mg | Freq: Once | INTRAMUSCULAR | Status: DC | PRN

## 2016-12-18 MED ORDER — SENNOSIDES-DOCUSATE SODIUM 8.6-50 MG PO TABS
2.0000 | ORAL_TABLET | ORAL | Status: DC
  Administered 2016-12-19 – 2016-12-22 (×4): 2 via ORAL
  Filled 2016-12-18 (×4): qty 2

## 2016-12-18 MED ORDER — SOD CITRATE-CITRIC ACID 500-334 MG/5ML PO SOLN
30.0000 mL | ORAL | Status: AC
  Administered 2016-12-18: 30 mL via ORAL
  Filled 2016-12-18: qty 15

## 2016-12-18 MED ORDER — MORPHINE SULFATE (PF) 2 MG/ML IV SOLN
1.0000 mg | INTRAVENOUS | Status: DC | PRN
  Administered 2016-12-18 – 2016-12-19 (×3): 2 mg via INTRAVENOUS
  Filled 2016-12-18 (×3): qty 1

## 2016-12-18 MED ORDER — PRENATAL MULTIVITAMIN CH
1.0000 | ORAL_TABLET | Freq: Every day | ORAL | Status: DC
  Administered 2016-12-18 – 2016-12-22 (×5): 1 via ORAL
  Filled 2016-12-18 (×5): qty 1

## 2016-12-18 MED ORDER — PROPOFOL 10 MG/ML IV BOLUS
INTRAVENOUS | Status: AC
  Filled 2016-12-18: qty 20

## 2016-12-18 MED ORDER — BUPIVACAINE 0.25 % ON-Q PUMP DUAL CATH 400 ML
400.0000 mL | INJECTION | Status: DC
  Filled 2016-12-18: qty 400

## 2016-12-18 MED ORDER — FENTANYL CITRATE (PF) 100 MCG/2ML IJ SOLN
INTRAMUSCULAR | Status: DC | PRN
  Administered 2016-12-18 (×2): 50 ug via INTRAVENOUS

## 2016-12-18 MED ORDER — MENTHOL 3 MG MT LOZG
1.0000 | LOZENGE | OROMUCOSAL | Status: DC | PRN
Start: 2016-12-18 — End: 2016-12-22
  Filled 2016-12-18: qty 9

## 2016-12-18 MED ORDER — FERROUS SULFATE 325 (65 FE) MG PO TABS
325.0000 mg | ORAL_TABLET | Freq: Two times a day (BID) | ORAL | Status: DC
  Administered 2016-12-18 – 2016-12-22 (×8): 325 mg via ORAL
  Filled 2016-12-18 (×8): qty 1

## 2016-12-18 MED ORDER — DIPHENHYDRAMINE HCL 25 MG PO CAPS
25.0000 mg | ORAL_CAPSULE | Freq: Four times a day (QID) | ORAL | Status: DC | PRN
  Administered 2016-12-18 (×2): 25 mg via ORAL
  Filled 2016-12-18 (×2): qty 1

## 2016-12-18 MED ORDER — OXYTOCIN 40 UNITS IN LACTATED RINGERS INFUSION - SIMPLE MED
2.5000 [IU]/h | INTRAVENOUS | Status: AC
  Filled 2016-12-18: qty 1000

## 2016-12-18 MED ORDER — DIPHENHYDRAMINE HCL 50 MG/ML IJ SOLN
12.5000 mg | Freq: Once | INTRAMUSCULAR | Status: AC
  Administered 2016-12-18: 12.5 mg via INTRAVENOUS

## 2016-12-18 MED ORDER — SODIUM CHLORIDE 0.9% FLUSH: 3.0000 mL | INTRAVENOUS | Status: DC | PRN

## 2016-12-18 MED ORDER — OXYTOCIN 40 UNITS IN LACTATED RINGERS INFUSION - SIMPLE MED
INTRAVENOUS | Status: DC | PRN
  Administered 2016-12-18: 1000 mL via INTRAVENOUS

## 2016-12-18 MED ORDER — NALOXONE HCL 0.4 MG/ML IJ SOLN: 0.4000 mg | INTRAMUSCULAR | Status: DC | PRN

## 2016-12-18 MED ORDER — IBUPROFEN 600 MG PO TABS
600.0000 mg | ORAL_TABLET | Freq: Four times a day (QID) | ORAL | Status: DC
  Administered 2016-12-19 (×3): 600 mg via ORAL
  Filled 2016-12-18 (×3): qty 1

## 2016-12-18 SURGICAL SUPPLY — 33 items
CANISTER SUCT 3000ML PPV (MISCELLANEOUS) ×3 IMPLANT
CATH KIT ON-Q SILVERSOAK 5IN (CATHETERS) ×6 IMPLANT
CLOSURE WOUND 1/2 X4 (GAUZE/BANDAGES/DRESSINGS) ×1
DERMABOND ADVANCED (GAUZE/BANDAGES/DRESSINGS) ×2
DERMABOND ADVANCED .7 DNX12 (GAUZE/BANDAGES/DRESSINGS) ×1 IMPLANT
DRSG OPSITE POSTOP 4X10 (GAUZE/BANDAGES/DRESSINGS) ×3 IMPLANT
DRSG TELFA 3X8 NADH (GAUZE/BANDAGES/DRESSINGS) ×3 IMPLANT
ELECT CAUTERY BLADE 6.4 (BLADE) ×3 IMPLANT
ELECT REM PT RETURN 9FT ADLT (ELECTROSURGICAL) ×3
ELECTRODE REM PT RTRN 9FT ADLT (ELECTROSURGICAL) ×1 IMPLANT
GAUZE SPONGE 4X4 12PLY STRL (GAUZE/BANDAGES/DRESSINGS) ×3 IMPLANT
GLOVE BIO SURGEON STRL SZ7 (GLOVE) ×15 IMPLANT
GLOVE INDICATOR 7.5 STRL GRN (GLOVE) ×15 IMPLANT
GOWN STRL REUS W/ TWL LRG LVL3 (GOWN DISPOSABLE) ×3 IMPLANT
GOWN STRL REUS W/TWL LRG LVL3 (GOWN DISPOSABLE) ×6
NS IRRIG 1000ML POUR BTL (IV SOLUTION) ×3 IMPLANT
PACK C SECTION AR (MISCELLANEOUS) ×3 IMPLANT
PAD OB MATERNITY 4.3X12.25 (PERSONAL CARE ITEMS) ×6 IMPLANT
PAD PREP 24X41 OB/GYN DISP (PERSONAL CARE ITEMS) ×3 IMPLANT
RETRACTOR TRAXI PANNICULUS (MISCELLANEOUS) ×1 IMPLANT
SPONGE LAP 18X18 5 PK (GAUZE/BANDAGES/DRESSINGS) ×9 IMPLANT
STRIP CLOSURE SKIN 1/2X4 (GAUZE/BANDAGES/DRESSINGS) ×2 IMPLANT
SUT CHROMIC GUT BROWN 0 54 (SUTURE) ×1 IMPLANT
SUT CHROMIC GUT BROWN 0 54IN (SUTURE) ×3
SUT MNCRL 4-0 (SUTURE) ×2
SUT MNCRL 4-0 27XMFL (SUTURE) ×1
SUT PDS AB 1 TP1 96 (SUTURE) ×3 IMPLANT
SUT PLAIN GUT 0 (SUTURE) ×3 IMPLANT
SUT VIC AB 0 CTX 36 (SUTURE) ×4
SUT VIC AB 0 CTX36XBRD ANBCTRL (SUTURE) ×2 IMPLANT
SUTURE MNCRL 4-0 27XMF (SUTURE) ×1 IMPLANT
SWABSTK COMLB BENZOIN TINCTURE (MISCELLANEOUS) ×3 IMPLANT
TRAXI PANNICULUS RETRACTOR (MISCELLANEOUS) ×2

## 2016-12-18 NOTE — Anesthesia Preprocedure Evaluation (Signed)
Anesthesia Evaluation  Patient identified by MRN, date of birth, ID band Patient awake    Reviewed: Allergy & Precautions, NPO status , Patient's Chart, lab work & pertinent test results, reviewed documented beta blocker date and time   History of Anesthesia Complications (+) Family history of anesthesia reaction and history of anesthetic complications  Airway Mallampati: III  TM Distance: >3 FB     Dental  (+) Chipped   Pulmonary former smoker,           Cardiovascular      Neuro/Psych    GI/Hepatic GERD  Controlled,  Endo/Other    Renal/GU      Musculoskeletal   Abdominal   Peds  Hematology   Anesthesia Other Findings   Reproductive/Obstetrics                             Anesthesia Physical Anesthesia Plan  ASA: II  Anesthesia Plan: Spinal   Post-op Pain Management:    Induction:   PONV Risk Score and Plan:   Airway Management Planned:   Additional Equipment:   Intra-op Plan:   Post-operative Plan:   Informed Consent: I have reviewed the patients History and Physical, chart, labs and discussed the procedure including the risks, benefits and alternatives for the proposed anesthesia with the patient or authorized representative who has indicated his/her understanding and acceptance.     Plan Discussed with: CRNA  Anesthesia Plan Comments:         Anesthesia Quick Evaluation

## 2016-12-18 NOTE — Anesthesia Post-op Follow-up Note (Signed)
Anesthesia QCDR form completed.        

## 2016-12-18 NOTE — Op Note (Signed)
Cesarean Section Operative Note    Sandy [name]   12/18/2016   Pre-operative Diagnosis:  1) intrauterine pregnancy at 2832w3d  2) History of cesarean delivery, desires repeat  Post-operative Diagnosis:  1) intrauterine pregnancy at 7932w3d  2) History of cesarean delivery, desires repeat  Procedure: repeat low transverse cesarean delivery via Pfannenstiel incision with double layer uterine closure  Surgeon: Surgeon(s) and Role:    Conard Novak* Jackson, Stephen D, MD - Primary    * Nadara MustardHarris, Robert P, MD - Assisting   Anesthesia: spinal   Findings:  1) normal appearing gravid uterus, fallopian tubes, and ovaries 2) viable female infant with APGARs 9 and 9 with weight of 2,600 grams   Estimated Blood Loss: 750 mL  Total IV Fluids: 1,000 ml crystalloid  Urine Output: 250 mL clear urine at end of case  Specimens: None  Complications: no complications  Disposition: PACU - hemodynamically stable.   Maternal Condition: stable   Baby condition / location:  Couplet care / Skin to Skin  Procedure Details:  The patient was seen in the Holding Room. The risks, benefits, complications, treatment options, and expected outcomes were discussed with the patient. The patient concurred with the proposed plan, giving informed consent. identified as Sandy [name] and the procedure verified as C-Section Delivery. A Time Out was held and the above information confirmed.   After induction of anesthesia, the patient was draped and prepped in the usual sterile manner. A Pfannenstiel incision was made and carried down through the subcutaneous tissue to the fascia. Fascial incision was made and extended transversely. The fascia was separated from the underlying rectus tissue superiorly and inferiorly. The peritoneum was identified and entered. Peritoneal incision was extended longitudinally. The bladder flap was not freed from the lower uterine segment. A low transverse uterine incision was made and the  hysterotomy was extended with cranial-caudal tension. Delivered from cephalic presentation was a 2,600 gram Living newborn infant(s) or Female with Apgar scores of 9 at one minute and 9 at five minutes. Cord ph was not sent the umbilical cord was clamped and cut cord blood was not obtained for evaluation. The placenta was removed Intact and appeared normal. The uterine outline, tubes and ovaries appeared normal. The uterine incision was closed with running locked sutures of 0 Vicryl.  A second layer of the same suture was thrown in an imbricating fashion.  Hemostasis was assured.  The uterus was returned to the abdomen and the paracolic gutters were cleared of all clots and debris.  The rectus muscles were inspected and found to be hemostatic.  The On-Q catheter pumps were inserted in accordance with the manufacturer's recommendations.  The catheters were inserted approximately 4cm cephelad to the incision line, approximately 1cm apart, straddling the midline.  They were inserted to a depth of the 4th mark. They were positioned superficial to the rectus abdominus muscles and deep to the rectus fascia.  After closure of the fascia it was noted that one of the pumps had incorrectly been placed and so it was removed.    The fascia was then reapproximated with running sutures of 1-0 PDS, looped. The subcutaneous tissue was reapproximated using 2-0 plain gut such that no greater than 2cm of dead space remained. The subcuticular closure was performed using 4-0 monocryl. The skin closure was reinforced using surgical skin glue.  The On-Q catheter was bolused with 5 mL of 0.5% marcaine plain.  The catheter was affixed to the skin with surgical skin glue, steri-strips,  and tegaderm.    Instrument, sponge, and needle counts were correct prior the abdominal closure and were correct at the conclusion of the case.  The patient received Ancef 3 gram IV prior to skin incision (within 30 minutes). For VTE prophylaxis she  was wearing SCDs throughout the case.   Signed: Conard Novak, MD 12/18/2016 9:00 AM

## 2016-12-18 NOTE — H&P (View-Only) (Signed)
OB History & Physical   History of Present Illness:  Chief Complaint: preoperative visit for cesarean delivery  HPI:  Sandy [name] is a 26 y.o. G29P2002 female at [redacted]w[redacted]d dated by LMP and early ultrasuond.  Her pregnancy has been complicated by obesity with BMI > 40, history of cesarean delivery, history of placenta abruption, history of preeclampsia, history of genital herpes.  She had a growth ultrasound nearly three weeks ago that showed borderline growth (11th %ile).   She denies contractions.   She denies leakage of fluid.   She notes her usual vaginal bleeding (spotting).   She reports fetal movement.    Maternal Medical History:   Past Medical History:  Diagnosis Date  . Bicornate uterus   . Complication of anesthesia    itching after C-Sections  . Family history of adverse reaction to anesthesia    uncle has a hard time waking up  . Genital HSV   . GERD (gastroesophageal reflux disease)   . Obesity affecting pregnancy    BMI>40    Past Surgical History:  Procedure Laterality Date  . CESAREAN SECTION  2012   for HSV outbreak  . CESAREAN SECTION N/A 11/29/2015   Procedure: CESAREAN SECTION Baby Girl 7lb.Sandy [name].;  Surgeon: Vena Austria, MD;  Location: [facility];  Service: Obstetrics;  Laterality: N/A;  . ears Bilateral    tubes  . ESOPHAGOGASTRODUODENOSCOPY (EGD) WITH PROPOFOL N/A 05/04/2016   Procedure: ESOPHAGOGASTRODUODENOSCOPY (EGD) WITH PROPOFOL;  Surgeon: Midge Minium, MD;  Location: [facility];  Service: Endoscopy;  Laterality: N/A;  . TONSILLECTOMY      Allergies  Allergen Reactions  . Oxycodone-Acetaminophen Itching  . Fluticasone Swelling    Mouth swelling  . Percocet [Oxycodone-Acetaminophen] Itching  . Tape     Some tapes cause blisters    Prior to Admission medications   Medication Sig Start Date End Date Taking? Authorizing Provider  Prenatal Vit-Fe Fumarate-FA (MULTIVITAMIN-PRENATAL) 27-0.8 MG TABS tablet Take 1 tablet by mouth daily  at 12 noon.   Yes [provider]    OB History  Gravida Para Term Preterm AB Living  SAB TAB Ectopic Multiple Live Births        0 2    # Outcome Date GA Lbr Len/2nd Weight Sex Delivery Anes PTL Lv  3 Current           2 Term 11/29/15 [redacted]w[redacted]d  7 lb 6.2 oz (3.35 kg) F CS-LVertical Spinal  LIV  1 Term 06/12/10    Sandy [name]   LIV      Prenatal care site: [facility]  Social History: She  reports that she quit smoking about 3 years ago. She has never used smokeless tobacco. She reports that she drinks alcohol. She reports that she does not use drugs.  Family History: family history includes Diabetes in her maternal aunt; Hyperlipidemia in her maternal aunt.   Review of Systems: Negative x 10 systems reviewed except as noted in the HPI.    Physical Exam:  Vital Signs: BP 110/70 (BP Location: Left Arm, Patient Position: Sitting, Cuff Size: Large)   Pulse 91   Ht  (1.651 m)   Wt 247 lb (112 kg)   LMP 03/31/2016   BMI 41.10 kg/m  Constitutional: Well nourished, well developed female in no acute distress.  HEENT: normal Skin: Warm and dry.  Cardiovascular: Regular rate and rhythm.   Extremity: no edema  Respiratory: Clear to auscultation bilateral. Normal respiratory effort Abdomen: FHT present and gravid, NT Back: no CVAT Neuro: DTRs 2+, Cranial nerves grossly intact Psych: Alert and Oriented x3. No memory deficits. Normal mood and affect.  MS: normal gait, normal bilateral lower extremity ROM/strength/stability.  NST Procedure:  Baseline FHR: 145 beats/min Variability: moderate Accelerations: present Decelerations: absent Tocometry: not performed  Interpretation:  INDICATIONS: vaginal bleeding, obesity RESULTS:  A NST procedure was performed with FHR monitoring and a normal baseline established, appropriate time of 20-40 minutes of evaluation, and accels >2 seen w 15x15 characteristics.  Results show a REACTIVE NST.    Assessment:    Sandy [name] is a 26 y.o. 703P2002 female at 406w2d with history of cesarean delivery, desires repeat. Early delivery per [facility] due to placental dysfunction this pregnancy.    Plan:  1. Admit to Labor & Delivery for repeat c-section 2. Orders placed, consents signed.   Thomasene MohairStephen Naviyah Schaffert, MD 12/17/2016 8:54 AM

## 2016-12-18 NOTE — Transfer of Care (Signed)
Immediate Anesthesia Transfer of Care Note  Patient: Sandy [name]  Procedure(s) Performed: Procedure(s): CESAREAN SECTION (N/A)  Patient Location: PACU  Anesthesia Type:spinal  Level of Consciousness: awake, alert  and oriented  Airway & Oxygen Therapy: Patient Spontanous Breathing  Post-op Assessment: Report given to RN and Post -op Vital signs reviewed and stable  Post vital signs: Reviewed and stable  Last Vitals:  Vitals:   12/18/16 0915 12/18/16 0925  BP: (!) 115/57 95/61  Pulse: 64 65  Resp: 17 17  SpO2: 99% 97%    Last Pain:  Vitals:   12/18/16 0926  PainSc: 6          Complications: No apparent anesthesia complications

## 2016-12-18 NOTE — Interval H&P Note (Signed)
History and Physical Interval Note:  12/18/2016 7:25 AM  Sandy [name]  has presented today for surgery, with the diagnosis of HISTORY OF CESAREAN, DESIRES REPEAT  The various methods of treatment have been discussed with the patient and family. After consideration of risks, benefits and other options for treatment, the patient has consented to  Procedure(s): CESAREAN SECTION (N/A) as a surgical intervention .  The patient's history has been reviewed, patient examined, no change in status, stable for surgery.  I have reviewed the patient's chart and labs.  Questions were answered to the patient's satisfaction.     Thomasene MohairStephen Romaine Neville, MD 12/18/2016 7:25 AM

## 2016-12-18 NOTE — Discharge Summary (Signed)
OB Discharge Summary     Patient Name: Sandy [name] DOB: Apr 05, 1991 MRN: [id number]  Date of admission: 12/18/2016 Delivering MD: Thomasene Mohair, MD  Date of Delivery: 12/18/2016  Date of discharge: 12/22/2016  Admitting diagnosis: HISTORY OF CESAREAN, DESIRES REPEAT Intrauterine pregnancy: [redacted]w[redacted]d     Secondary diagnosis: None     Discharge diagnosis: Term Pregnancy Delivered                                                                                                Post partum procedures: None  Augmentation: n/a  Complications: None  Hospital course:  Sceduled C/S   26 y.o. yo G3P3003 at [redacted]w[redacted]d was admitted to the hospital 12/18/2016 for scheduled cesarean section with the following indication:Elective Repeat.  Patient delivered a Viable infant.12/18/2016  Details of operation can be found in separate operative note.  Pateint had an uncomplicated postpartum course.  She is ambulating, tolerating a regular diet, passing flatus, and urinating well. Patient is discharged home in stable condition on  12/22/16         Physical exam  Vitals:   12/21/16 2004 12/22/16 0155 12/22/16 0314 12/22/16 1320  BP: 117/77   126/71  Pulse: 75   77  Resp: 20   17  Temp:  98.4 F (36.9 C) 98.4 F (36.9 C) 98.3 F (36.8 C)  TempSrc: Oral Oral Oral Oral  SpO2:    100%  Weight:      Height:       General: alert, cooperative and no distress Lochia: appropriate Uterine Fundus: firm Incision: Healing well with no significant drainage DVT Evaluation: No evidence of DVT seen on physical exam.  Labs: Lab Results  Component Value Date   WBC 7.9 12/19/2016   HGB 11.9 (L) 12/19/2016   HCT 34.0 (L) 12/19/2016   MCV 86.1 12/19/2016   PLT 126 (L) 12/19/2016    Discharge Instructions: Per After Visit Summary.  Medications:   Allergies as of 12/22/2016      Reactions   Oxycodone-acetaminophen Itching   Fluticasone Swelling   Mouth swelling   Percocet [oxycodone-acetaminophen] Itching   Tape    Some tapes cause blisters      Medication List    TAKE these medications   HYDROmorphone 2 MG tablet Commonly known as:  DILAUDID Take 1 tablet (2 mg total) by mouth every 4 (four) hours as needed for moderate pain or severe pain (pain score 4-7/10).   ibuprofen 600 MG tablet Commonly known as:  ADVIL,MOTRIN Take 1 tablet (600 mg total) by mouth every 6 (six) hours.   multivitamin-prenatal 27-0.8 MG Tabs tablet Take 1 tablet by mouth daily at 12 noon.   norethindrone 0.35 MG tablet Commonly known as:  MICRONOR,CAMILA,ERRIN Take 1 tablet (0.35 mg total) by mouth daily. Start taking in 5 days.            Discharge Care Instructions        Start     Ordered   12/22/16 0000  HYDROmorphone (DILAUDID) 2 MG tablet  Every 4 hours PRN    Question:  Supervising Provider  AnswerVena Austria:  STAEBLER, ANDREAS   12/22/16 1052   12/22/16 0000  ibuprofen (ADVIL,MOTRIN) 600 MG tablet  Every 6 hours    Question:  Supervising Provider  Answer:  Vena AustriaSTAEBLER, ANDREAS   12/22/16 1052   12/22/16 0000  Call MD for:    Comments:  Heavy vaginal bleeding greater than 1 pad an hour   12/22/16 1052   12/22/16 0000  Call MD for:  temperature >100.4     12/22/16 1052   12/22/16 0000  Call MD for:  persistant nausea and vomiting     12/22/16 1052   12/22/16 0000  Call MD for:  severe uncontrolled pain     12/22/16 1052   12/22/16 0000  Call MD for:  redness, tenderness, or signs of infection (pain, swelling, redness, odor or green/yellow discharge around incision site)     12/22/16 1052   12/22/16 0000  Call MD for:  difficulty breathing, headache or visual disturbances     12/22/16 1052   12/22/16 0000  Call MD for:  hives     12/22/16 1052   12/22/16 0000  Call MD for:  persistant dizziness or light-headedness     12/22/16 1052   12/22/16 0000  Call MD for:  extreme fatigue     12/22/16 1052   12/22/16 0000  Lifting restrictions    Comments:  Weight restriction of 10lbs for 6 weeks.    12/22/16 1052   12/22/16 0000  Driving restriction    Comments:  Avoid driving for at least 2 weeks or while taking prescription narcotics.   12/22/16 1052   12/22/16 0000  Sexual acrtivity    Comments:  No intercourse, tampons, or anything vaginally for 6 weeks   12/22/16 1052   12/22/16 0000  Discharge wound care:    Comments:  You may apply a light dressing for minor discharge from the incision or to keep waistbands of clothing from rubbing.  You may also have been discharge with a clear dressing in which case this will be removed at your postoperative clinic visit.  You may shower, use soap on your incision.  Avoiding baths or soaking the incision in the first 6 weeks following your surgery.Marland Kitchen.   12/22/16 1052   12/22/16 0000  Diet general     12/22/16 1052   12/22/16 0000  norethindrone (MICRONOR,CAMILA,ERRIN) 0.35 MG tablet  Daily    Question:  Supervising Provider  Answer:  Vena AustriaSTAEBLER, ANDREAS   12/22/16 1748       Diet: routine diet  Activity: Advance as tolerated. Pelvic rest for 6 weeks.   Outpatient follow up: Follow-up Information    Conard NovakJackson, Stephen D, MD. Schedule an appointment as soon as possible for a visit in 1 week(s).   Specialty:  Obstetrics and Gynecology Why:  Please call to make your 1 week follow up appointment for an incision check with Dr. Henderson NewcomerJackson  Contact information: [address] [place] [address] [phone]             Postpartum contraception: Progesterone only pills and Nexplanon Rhogam Given postpartum: no Rubella vaccine given postpartum: no Varicella vaccine given postpartum: no TDaP given antepartum or postpartum: no  Newborn Data: Live born female  Birth Weight: 5 lb 11.7 oz (2600 g) APGAR: 9, 9  Baby Feeding: Breast  Disposition:currently in SCN  SIGNED: Marcelyn BruinsJacelyn Schmid, CNM 12/22/2016  5:50 PM

## 2016-12-19 LAB — CBC
HCT: 34 % — ABNORMAL LOW (ref 35.0–47.0)
Hemoglobin: 11.9 g/dL — ABNORMAL LOW (ref 12.0–16.0)
MCH: 30.3 pg (ref 26.0–34.0)
MCHC: 35.1 g/dL (ref 32.0–36.0)
MCV: 86.1 fL (ref 80.0–100.0)
Platelets: 126 10*3/uL — ABNORMAL LOW (ref 150–440)
RBC: 3.95 MIL/uL (ref 3.80–5.20)
RDW: 16.5 % — ABNORMAL HIGH (ref 11.5–14.5)
WBC: 7.9 10*3/uL (ref 3.6–11.0)

## 2016-12-19 MED ORDER — HYDROMORPHONE HCL 2 MG PO TABS
4.0000 mg | ORAL_TABLET | ORAL | Status: DC | PRN
  Administered 2016-12-19 – 2016-12-22 (×15): 4 mg via ORAL
  Filled 2016-12-19 (×15): qty 2

## 2016-12-19 MED ORDER — HYDROMORPHONE HCL 2 MG PO TABS
2.0000 mg | ORAL_TABLET | ORAL | Status: DC | PRN
  Administered 2016-12-21 – 2016-12-22 (×2): 2 mg via ORAL
  Filled 2016-12-19 (×3): qty 1

## 2016-12-19 MED ORDER — ACETAMINOPHEN 325 MG PO TABS: 650.0000 mg | ORAL_TABLET | ORAL | Status: DC | PRN

## 2016-12-19 NOTE — Anesthesia Post-op Follow-up Note (Signed)
  Anesthesia Pain Follow-up Note  Patient: Sandy [name]  Day #: 1  Date of Follow-up: 12/19/2016 Time: 8:05 AM  Last Vitals:  Vitals:   12/18/16 2257 12/19/16 0311  BP: (!) 115/50 (!) 106/53  Pulse: 87 83  Resp: 18 18  Temp: 36.8 C 36.8 C  SpO2: 97% 100%    Level of Consciousness: alert  Pain: mild   Side Effects:None  Catheter Site Exam:clean     Plan: D/C from anesthesia care at surgeon's request  Lenward ChancellorSavage,  Jordann Grime A

## 2016-12-19 NOTE — Progress Notes (Deleted)
During touch time, infant become completely apneic w/ saturations as low as 40 (CPAP 9L, 6PEEP, FIO2 of 34).  CPAP removed, and PPV initiated w/ PEEP 5, PIP 20, FIO2 100%, 10L.  Dr. Cleatis PolkaAuten called to the bedside, infant still apneic. Precedex gtt stopped.  Infant started initiating breathing, and crying.  CPAP resumed.  vS HR= 138, RR=60, SO2= 93, BP 57/36 (44) RT arm.  Morphine was given at 12:30; precedex started at 13:30.

## 2016-12-19 NOTE — Anesthesia Postprocedure Evaluation (Signed)
Anesthesia Post Note  Patient: Sandy [name]  Procedure(s) Performed: Procedure(s) (LRB): CESAREAN SECTION (N/A)  Patient location during evaluation: Mother Baby Anesthesia Type: Spinal Level of consciousness: oriented and awake and alert Pain management: pain level controlled Vital Signs Assessment: post-procedure vital signs reviewed and stable Respiratory status: spontaneous breathing and respiratory function stable Cardiovascular status: blood pressure returned to baseline and stable Postop Assessment: no headache and no backache Anesthetic complications: no     Last Vitals:  Vitals:   12/18/16 2257 12/19/16 0311  BP: (!) 115/50 (!) 106/53  Pulse: 87 83  Resp: 18 18  Temp: 36.8 C 36.8 C  SpO2: 97% 100%    Last Pain:  Vitals:   12/19/16 0634  TempSrc:   PainSc: 6                  Michaele OfferSavage,  Juandaniel Manfredo A

## 2016-12-19 NOTE — Lactation Note (Signed)
This note was copied from a baby's chart. Lactation Consultation Note  Patient Name: [name] LKGMW'NToday's Date: 12/19/2016    Mom states she is pumping less milk today than yesterday. I reviewed realistic pumping expectations and frequency of pumping and hand expression, etc. She says she believes she has a Pump In Style she can use for home use when she is discharged and plans to have someone bring it in. [facility] also [facility] and is contacting them for pump options if needed.    Maternal Data    Feeding    LATCH Score                   Interventions    Lactation Tools Discussed/Used     Consult Status      Sunday CornSandra Clark Bernetta Sutley 12/19/2016, 5:24 PM

## 2016-12-19 NOTE — Anesthesia Post-op Follow-up Note (Deleted)
  Anesthesia Pain Follow-up Note  Patient: Sandy [name]  Day #: 1  Date of Follow-up: 12/19/2016 Time: 8:04 AM  Last Vitals:  Vitals:   12/18/16 2257 12/19/16 0311  BP: (!) 115/50 (!) 106/53  Pulse: 87 83  Resp: 18 18  Temp: 36.8 C 36.8 C  SpO2: 97% 100%    Level of Consciousness: alert  Pain: mild   Side Effects:None  Catheter Site Exam:clean     Plan: D/C from anesthesia care at surgeon's request  Lenward ChancellorSavage,  Eugene Isadore A

## 2016-12-19 NOTE — Progress Notes (Signed)
   Subjective:  Doing well, still some issues with pain control.  No fevers, no chills.  Tolerating po without N/V.  Objective:  Blood pressure (!) 115/55, pulse 90, temperature 98.2 F (36.8 C), temperature source Oral, resp. rate 18, height 5\' 5"  (1.651 m), weight 247 lb (112 kg), last menstrual period 03/31/2016, SpO2 97 %, unknown if currently breastfeeding.  General: NAD Pulmonary: no increased work of breathing Abdomen: non-distended, non-tender, fundus firm at level of umbilicus Incision: D/C/I Extremities: no edema, no erythema, no tenderness  Results for orders placed or performed during the hospital encounter of 12/18/16 (from the past 72 hour(s))  CBC     Status: Abnormal   Collection Time: 12/19/16  5:15 AM  Result Value Ref Range   WBC 7.9 3.6 - 11.0 K/uL   RBC 3.95 3.80 - 5.20 MIL/uL   Hemoglobin 11.9 (L) 12.0 - 16.0 g/dL   HCT 16.134.0 (L) 09.635.0 - 04.547.0 %   MCV 86.1 80.0 - 100.0 fL   MCH 30.3 26.0 - 34.0 pg   MCHC 35.1 32.0 - 36.0 g/dL   RDW 40.916.5 (H) 81.111.5 - 91.414.5 %   Platelets 126 (L) 150 - 440 K/uL     Assessment:   26 y.o. [id number] postoperativeday # 1 RLTCS   Plan:  1) Acute blood loss anemia - hemodynamically stable and asymptomatic - po ferrous sulfate  2) Blood Type --/--/A POS (09/10 1016) / Rubella Immune  3) TDAP status - needs  4) Disposition - anticipate discharge POD3-4

## 2016-12-20 MED ORDER — IBUPROFEN 600 MG PO TABS
600.0000 mg | ORAL_TABLET | Freq: Four times a day (QID) | ORAL | Status: DC
  Administered 2016-12-20 – 2016-12-22 (×10): 600 mg via ORAL
  Filled 2016-12-20 (×10): qty 1

## 2016-12-20 NOTE — Progress Notes (Signed)
Obstetric Postpartum/PostOperative Daily Progress Note Subjective:  26 y.o. [id number] status post repeat cesarean delivery.  She is ambulating, is tolerating po, is voiding spontaneously.  Her pain is well controlled on PO pain medications. Her lochia is less than menses.  She is feeling more tired today, but is progressing well.   Medications SCHEDULED MEDICATIONS  . Breast Milk   Feeding See admin instructions  . ferrous sulfate  325 mg Oral BID WC  . ibuprofen  600 mg Oral Q6H  . prenatal multivitamin  1 tablet Oral Q1200  . senna-docusate  2 tablet Oral Q24H  . simethicone  80 mg Oral TID PC    MEDICATION INFUSIONS  . lactated ringers Stopped (12/19/16 1000)  . naLOXone [facility](NARCAN) adult infusion for PRURITIS      PRN MEDICATIONS  acetaminophen, coconut oil, witch hazel-glycerin **AND** dibucaine, diphenhydrAMINE, diphenhydrAMINE **OR** diphenhydrAMINE, HYDROmorphone **OR** HYDROmorphone, menthol-cetylpyridinium, morphine injection, nalbuphine **OR** nalbuphine, nalbuphine **OR** nalbuphine, naLOXone (NARCAN) adult infusion for PRURITIS, naloxone **AND** sodium chloride flush, ondansetron (ZOFRAN) IV    Objective:   Vitals:   12/19/16 1857 12/19/16 2320 12/20/16 0702 12/20/16 1106  BP: 130/83  (!) 108/50 116/62  Pulse: 97  75 78  Resp: 18  18 20   Temp: 97.9 F (36.6 C) 98 F (36.7 C)  98.4 F (36.9 C)  TempSrc: Oral Oral  Oral  SpO2: 99%  98% 99%  Weight:      Height:        Current Vital Signs 24h Vital Sign Ranges  T 98.4 F (36.9 C) Temp  Avg: 98.4 F (36.9 C)  Min: 97.9 F (36.6 C)  Max: 98.8 F (37.1 C)  BP 116/62 BP  Min: 108/50  Max: 130/83  HR 78 Pulse  Avg: 82  Min: 75  Max: 97  RR 20 Resp  Avg: 18.5  Min: 18  Max: 20  SaO2 99 % Not Delivered SpO2  Avg: 98.5 %  Min: 98 %  Max: 99 %       24 Hour I/O Current Shift I/O  Time Ins Outs 09/12 0701 - 09/13 0700 In: 733.8 [P.O.:240; I.V.:493.8] Out: 900 [Urine:900] 09/13 0701 - 09/13 1900 In: 240  [P.O.:240] Out: -   General: NAD Pulmonary: no increased work of breathing Abdomen: non-distended, non-tender, fundus firm at level of umbilicus Inc: Clean/dry/intact Extremities: no edema, no erythema, no tenderness  Labs:   Recent Labs Lab 12/17/16 1016 12/19/16 0515  WBC 6.8 7.9  HGB 13.1 11.9*  HCT 36.9 34.0*  PLT 134* 126*     Assessment:   26 y.o. G3P3003 postoperative day #  2, s/p repeat cesarean section, progressing slowly but well  Plan:  1) Acute blood loss anemia - hemodynamically stable and asymptomatic - po ferrous sulfate  2) A POS / Rubella and varicella unknown. Will have drawn, if not done already.  3) TDAP status unknown  4) breast feeding /Contraception = considering pill then nexplanon  5) Disposition: Home POD#3-4  Conard NovakStephen D. Jackson, MD, FACOG 12/20/2016 11:42 AM

## 2016-12-21 LAB — HEPATITIS B SURFACE ANTIGEN: Hepatitis B Surface Ag: NEGATIVE

## 2016-12-21 NOTE — Progress Notes (Signed)
POD #3 S/P repeat Cesarean section Subjective:   ON Q dressing is leaking. HAs been to nursery via wheelchair yesterday, was feeling weak and dizzy. Feeling better today. Tolerating regular diet and passing some flatus. Voiding without difficulty. Baby will be coming out for skin to skin today. Received surfactant.  Objective:  Blood pressure 120/63, pulse 74, temperature 98.3 F (36.8 C), temperature source Oral, resp. rate 18, height  (1.651 m), weight 112 kg (247 lb), last menstrual period 03/31/2016, SpO2 100 %, unknown if currently breastfeeding.  General: NAD Heart: RRR without murmur Pulmonary: no increased work of breathing/ CTA at bases Abdomen: non-distended, non-tender, soft, fundus firm at level of umbilicus-4FB Incision: Honey comb dressing C+D+I.; ON Q dressing saturated with serosanguinous drainage. Dressing changed and more skin glue applied to base of catheter. Extremities: +1 edema, no erythema, no tenderness  Results for orders placed or performed during the hospital encounter of 12/18/16 (from the past 72 hour(s))  CBC     Status: Abnormal   Collection Time: 12/19/16  5:15 AM  Result Value Ref Range   WBC 7.9 3.6 - 11.0 K/uL   RBC 3.95 3.80 - 5.20 MIL/uL   Hemoglobin 11.9 (L) 12.0 - 16.0 g/dL   HCT 40.9 (L) 81.1 - 91.4 %   MCV 86.1 80.0 - 100.0 fL   MCH 30.3 26.0 - 34.0 pg   MCHC 35.1 32.0 - 36.0 g/dL   RDW 78.2 (H) 95.6 - 21.3 %   Platelets 126 (L) 150 - 440 K/uL  Hepatitis B surface antigen     Status: None   Collection Time: 12/20/16 11:31 AM  Result Value Ref Range   Hepatitis B Surface Ag Negative Negative    Comment: (NOTE) Performed At: [facility] [address] [place], [place] [id number] Mila Homer MD GE:[id number]      Assessment:   26 y.o. [id number] postoperativeday # 3-satble   Plan:   1) Ambulate in hallway, with assist if needed.  2) --/--/A POS (09/10 1016) /   / Varicella Immune/ Rubella Immune from 2017 prenatal  flow sheet  3) TDAP-last given 2012  4) Breast/Bottle/Contraception-pills and Nexplanon  5) Disposition-probable discharge tomorrow.  Farrel Conners, CNM

## 2016-12-22 MED ORDER — HYDROMORPHONE HCL 2 MG PO TABS: 2.0000 mg | ORAL_TABLET | ORAL | 0 refills | Status: DC | PRN

## 2016-12-22 MED ORDER — IBUPROFEN 600 MG PO TABS: 600.0000 mg | ORAL_TABLET | Freq: Four times a day (QID) | ORAL | 1 refills | Status: DC

## 2016-12-22 MED ORDER — NORETHINDRONE 0.35 MG PO TABS: 1.0000 | ORAL_TABLET | Freq: Every day | ORAL | 11 refills | Status: DC

## 2016-12-22 NOTE — Progress Notes (Addendum)
POD#4 Repeat C/S Subjective:  Sitting up in bed, cheerful, states pain is well controlled. No difficulties with voiding, ambulating; tolerating regular diet. No BM yet but passing flatus and abdomen non-distended.  Objective:  Blood pressure 117/77, pulse 75, temperature 98.4 F (36.9 C), temperature source Oral, resp. rate 20, height  (1.651 m), weight 247 lb (112 kg), last menstrual period 03/31/2016, SpO2 100 %, currently breastfeeding.  General: NAD Pulmonary: no increased work of breathing Abdomen: non-distended, non-tender, fundus firm at U/2, lochia scant Incision: C/D/I, OnQ pump in place Extremities: trace edema, no erythema, no tenderness  Results for orders placed or performed during the hospital encounter of 12/18/16 (from the past 72 hour(s))  Hepatitis B surface antigen     Status: None   Collection Time: 12/20/16 11:31 AM  Result Value Ref Range   Hepatitis B Surface Ag Negative Negative    Comment: (NOTE) Performed At: [facility] [address] [place], [place] [id number] Mila Homer MD WU:[id number]      Assessment:   26 y.o. [phone] post operative day # 4 recovering well.   Plan:  1) Acute blood loss anemia - hemodynamically stable and asymptomatic - po ferrous sulfate  2) Blood Type --/--/A POS (09/10 1016) / Rubella immune   / Varicella immune  3) TDAP status: received 2012  4) Breastfeeding.  5) Contraception: wants to start Minipill, and then Nexplanon at 6 week postpartum visit.  5) Disposition: Discharge home today, may board if baby is not discharged.  Marcelyn Bruins, CNM 12/22/2016  10:47 AM

## 2016-12-22 NOTE — Progress Notes (Signed)
Discharge order received from doctor. On-Q pump removed prior to discharge per patient request. Reviewed discharge instructions and prescriptions with patient and answered all questions. Incision cleaning kit given. Follow up appointment given. Patient verbalized understanding. ID bands checked. Patient discharged home without infant (infant in SCN) via wheelchair by nursing/auxillary.    Hilbert Bible, RN

## 2016-12-22 NOTE — Discharge Instructions (Signed)
Please call your doctor or return to the ER if you experience any chest pains, shortness of breath, dizziness, visual changes, fever greater than 101, any heavy bleeding (saturating more than 1 pad per hour), large clots, or foul smelling discharge, any worsening abdominal pain and cramping that is not controlled by pain medication, or any signs of postpartum depression. No tampons, enemas, douches, or sexual intercourse for 6 weeks. Also avoid tub baths, hot tubs, or swimming for 6 weeks.  ° ° °Check your incision daily for any signs of infection such as redness, warmth, swelling, increased pain, pus or foul smelling drainage ° ° °Activity: do not lift over 10 lbs for 6 weeks °No driving for 1-2 weeks  °Pelvic rest for 6 weeks  °

## 2016-12-25 ENCOUNTER — Encounter: Payer: Self-pay | Admitting: Obstetrics and Gynecology

## 2016-12-25 ENCOUNTER — Ambulatory Visit (INDEPENDENT_AMBULATORY_CARE_PROVIDER_SITE_OTHER): Payer: Medicaid Other | Admitting: Obstetrics and Gynecology

## 2016-12-25 ENCOUNTER — Telehealth: Payer: Self-pay | Admitting: Obstetrics and Gynecology

## 2016-12-25 VITALS — BP 132/84 | Wt 238.0 lb

## 2016-12-25 DIAGNOSIS — Z98891 History of uterine scar from previous surgery: Secondary | ICD-10-CM

## 2016-12-25 NOTE — Telephone Encounter (Signed)
Noted. Will order to arrive by apt date/time. 

## 2016-12-25 NOTE — Progress Notes (Signed)
   Postoperative Follow-up Patient presents post op from cesarean section  7days ago.  Subjective: She denies fever, chills, nausea and vomiting. Eating a regular diet without difficulty. The patient is not having any pain.  Activity: increasing slowly and appropriately. She denies issues with her incision.    Objective: BP 132/84   Wt 238 lb (108 kg)   BMI 39.61 kg/m   Constitutional: Well nourished, well developed female in no acute distress.  HEENT: normal Skin: Warm and dry.  Abdomen: soft, NT, ND, uterine fundus at U-4 clean, dry, intact and without erythema, induration, warmth, and tenderness Extremity: no edema   Assessment: 26 y.o. s/p cesarean section progressing well  Plan: Patient has done well after surgery with no apparent complications.  I have discussed the post-operative course to date, and the expected progress moving forward.  The patient understands what complications to be concerned about.    Activity plan: increase slowly Incision care instructions given  Desires Nexplanon for contraception  Return in about 5 weeks (around 01/29/2017) for Nexplanon placment and 6 week postpartum.  Thomasene Mohair, MD 12/25/2016 1:42 PM

## 2016-12-25 NOTE — Telephone Encounter (Signed)
10/24 9:30 SDJ nexplanon insert

## 2016-12-31 ENCOUNTER — Other Ambulatory Visit: Payer: Medicaid Other

## 2016-12-31 ENCOUNTER — Encounter: Payer: Medicaid Other | Admitting: Obstetrics and Gynecology

## 2017-01-30 ENCOUNTER — Ambulatory Visit (INDEPENDENT_AMBULATORY_CARE_PROVIDER_SITE_OTHER): Payer: Medicaid Other | Admitting: Obstetrics and Gynecology

## 2017-01-30 DIAGNOSIS — Z308 Encounter for other contraceptive management: Secondary | ICD-10-CM

## 2017-01-30 DIAGNOSIS — Z3049 Encounter for surveillance of other contraceptives: Secondary | ICD-10-CM | POA: Diagnosis not present

## 2017-01-30 MED ORDER — ETONOGESTREL 68 MG ~~LOC~~ IMPL: 68.0000 mg | DRUG_IMPLANT | Freq: Once | SUBCUTANEOUS | Status: DC

## 2017-01-30 NOTE — Progress Notes (Signed)
Postpartum Visit   Chief Complaint  Patient presents with  . 6 week post partum   History of Present Illness: Patient is a 26 y.o. Z6X0960 presents for postpartum visit.  Date of delivery: 12/18/2016 Type of delivery: C-Section Episiotomy No.  Laceration: no Pregnancy or labor problems:  no Any problems since the delivery:  no  Newborn Details:  SINGLETON :  1. Baby's name: Kara Mead. Birth weight: 5.12lb Maternal Details:  Breast Feeding:  no Post partum depression/anxiety noted:  no Edinburgh Post-Partum Depression Score:  3  Date of last PAP: 04/14/2015/NORMAL  Past Medical History:  Diagnosis Date  . Bicornate uterus   . Complication of anesthesia    itching after C-Sections  . Family history of adverse reaction to anesthesia    uncle has a hard time waking up  . Genital HSV   . GERD (gastroesophageal reflux disease)   . Obesity affecting pregnancy    BMI>40    Past Surgical History:  Procedure Laterality Date  . CESAREAN SECTION  2012   for HSV outbreak  . CESAREAN SECTION N/A 11/29/2015   Procedure: CESAREAN SECTION Baby Girl 7lb.[name];  Surgeon: Vena Austria, MD;  Location: [facility] ORS;  Service: Obstetrics;  Laterality: N/A;  . CESAREAN SECTION N/A 12/18/2016   Procedure: CESAREAN SECTION;  Surgeon: Conard Novak, MD;  Location: [facility] OR[facility];  Service: Obstetrics;  Laterality: N/A;  . ears Bilateral    tubes  . ESOPHAGOGASTRODUODENOSCOPY (EGD) WITH PROPOFOL N/A 05/04/2016   Procedure: ESOPHAGOGASTRODUODENOSCOPY (EGD) WITH PROPOFOL;  Surgeon: Midge Minium, MD;  Location: [facility];  Service: Endoscopy;  Laterality: N/A;  . TONSILLECTOMY      Prior to Admission medications   Medication Sig Start Date End Date Taking? Authorizing Provider  norethindrone (MICRONOR,CAMILA,ERRIN) 0.35 MG tablet Take 1 tablet (0.35 mg total) by mouth daily. Start taking in 5 days. 12/22/16   Oswaldo Conroy, CNM  Prenatal Vit-Fe Fumarate-FA (MULTIVITAMIN-PRENATAL) 27-0.8  MG TABS tablet Take 1 tablet by mouth daily at 12 noon.    [provider]    Allergies  Allergen Reactions  . Oxycodone-Acetaminophen Itching  . Fluticasone Swelling    Mouth swelling  . Percocet [Oxycodone-Acetaminophen] Itching  . Tape     Some tapes cause blisters     Social History   Social History  . Marital status: Legally Separated    Spouse name: N/A  . Number of children: N/A  . Years of education: N/A   Occupational History  . Not on file.   Social History Main Topics  . Smoking status: Former Smoker    Quit date: 04/09/2013  . Smokeless tobacco: Never Used  . Alcohol use Yes     Comment: 2x/month, none during pregnacy  . Drug use: No  . Sexual activity: Yes    Birth control/ protection: None   Other Topics Concern  . Not on file   Social History Narrative  . No narrative on file    Family History  Problem Relation Age of Onset  . Diabetes Maternal Aunt   . Hyperlipidemia Maternal Aunt     Review of Systems  Constitutional: Negative.   HENT: Negative.   Eyes: Negative.   Respiratory: Negative.   Cardiovascular: Negative.   Gastrointestinal: Negative.   Genitourinary: Negative.   Musculoskeletal: Negative.   Skin: Negative.   Neurological: Negative.   Psychiatric/Behavioral: Negative.      Physical Exam BP 124/78   Ht 5\' 5"  (1.651 m)   Wt 242  lb (109.8 kg)   BMI 40.27 kg/m   Physical Exam  Constitutional: She is oriented to person, place, and time. She appears well-developed and well-nourished. No distress.  Genitourinary: Vagina normal and uterus normal. Pelvic exam was performed with patient supine. There is no rash, tenderness or lesion on the right labia. There is no rash, tenderness or lesion on the left labia. Right adnexum does not display mass, does not display tenderness and does not display fullness. Left adnexum does not display mass, does not display tenderness and does not display fullness. Cervix does not exhibit  motion tenderness, lesion or polyp.   Uterus is mobile and anteverted. Uterus is not enlarged, tender, exhibiting a mass or irregular (is regular).  HENT:  Head: Normocephalic and atraumatic.  Eyes: EOM are normal. No scleral icterus.  Neck: Normal range of motion. Neck supple.  Cardiovascular: Normal rate, regular rhythm and normal heart sounds.   Pulmonary/Chest: Effort normal and breath sounds normal. No respiratory distress. She has no wheezes. She has no rales.  Abdominal: Soft. Bowel sounds are normal. She exhibits no distension and no mass. There is no tenderness. There is no rebound and no guarding.  Incision without erythema, induration, warmth, and tenderness. Clean, dry, and intact  Musculoskeletal: Normal range of motion. She exhibits no edema.  Neurological: She is alert and oriented to person, place, and time. No cranial nerve deficit.  Skin: Skin is warm and dry. No erythema.  Psychiatric: She has a normal mood and affect. Her behavior is normal. Judgment normal.    GYNECOLOGY PROCEDURE NOTE  Patient is a 26 y.o. Z6X0960 presenting for Nexplanon insertion as her desires means of contraception.  She provided informed consent, signed copy in the chart, time out was performed. Patient has been taking norethindrone 0.35mg  daily since delivery.  She understands that Nexplanon is a progesterone only therapy, and that patients often patients have irregular and unpredictable vaginal bleeding or amenorrhea. She understands that other side effects are possible related to systemic progesterone, including but not limited to, headaches, breast tenderness, nausea, and irritability. While effective at preventing pregnancy long acting reversible contraceptives do not prevent transmission of sexually transmitted diseases and use of barrier methods for this purpose was discussed. The placement procedure for Nexplanon was reviewed with the patient in detail including risks of nerve injury, infection,  bleeding and injury to other muscles or tendons. She understands that the Nexplanon implant is good for 3 years and needs to be removed at the end of that time.  She understands that Nexplanon is an extremely effective option for contraception, with failure rate of <1%. This information is reviewed today and all questions were answered. Informed consent was obtained, both verbally and written.   The patient is healthy and has no contraindications to Nexplanon use.   Procedure Appropriate time out taken.  Patient placed in dorsal supine with left arm above head, elbow flexed at 90 degrees, arm resting on examination table.  The bicipital grove was palpated and site 8-10cm proximal to the medial epicondyle was indentified . The insertion site was prepped with a two betadine swabs and then injected with 3 cc of 1% lidocaine without epinephrine.  Nexplanon removed form sterile blister packaging,  Device confirmed in needle, before inserting full length of needle, tenting up the skin as the needle was advance.  The drug eluting rod was then deployed by pulling back the slider per the manufactures recommendation.  The implant was palpable by the clinician as well  as the patient.  The insertion site covered dressed with a band aid before applying  a kerlex bandage pressure dressing..Minimal blood loss was noted during the procedure.  The patientt tolerated the procedure well.   She was instructed to wear the bandage for 24 hours, call with any signs of infection.  She was given the Nexplanon card and instructed to have the rod removed in 3 years.  Female Chaperone present during breast and/or pelvic exam.  Assessment: 26 y.o. [id number] presenting for 6 week postpartum visit  Plan: Problem List Items Addressed This Visit    None    Visit Diagnoses    Postpartum care and examination    -  Primary   Encounter for other contraceptive management       Relevant Medications   etonogestrel (NEXPLANON) implant 68  mg (Start on 01/30/2017  9:45 AM)     1) Contraception Education given regarding options for contraception, including Nexplanon (which is placed today).  2)  Pap - ASCCP guidelines and rational discussed.  Patient opts for routine screening interval  3) Patient underwent screening for postpartum depression with no concerns noted.  4) Follow up 1 year for routine annual exam  Thomasene MohairStephen Sema Stangler, MD 01/30/2017 9:44 AM

## 2017-02-01 ENCOUNTER — Telehealth: Payer: Self-pay

## 2017-02-01 NOTE — Telephone Encounter (Signed)
Pt saw SDJ 01/30/17 for 6 wk ppc. She forgot to mention stomach pain she is having under her rib cage. She thought maybe it was diet related, but her mom wanted her to call to be sure it wasn't associated w/her c-section. Cb#[phone].

## 2017-02-01 NOTE — Telephone Encounter (Signed)
Pt aware via vm that it does not sound like it would be associated with c-section, but to let us know if it gets worse and she can come in to be seen

## 2017-02-01 NOTE — Telephone Encounter (Signed)
Pt returning missed call. 

## 2017-02-01 NOTE — Telephone Encounter (Signed)
Spoke w/pt. She states her voicemail didn't come thru. Relayed message from B NoraShaw.

## 2017-04-04 ENCOUNTER — Encounter: Payer: Self-pay | Admitting: Emergency Medicine

## 2017-04-04 ENCOUNTER — Other Ambulatory Visit: Payer: Self-pay

## 2017-04-04 ENCOUNTER — Emergency Department: Payer: Medicaid Other

## 2017-04-04 ENCOUNTER — Emergency Department
Admission: EM | Admit: 2017-04-04 | Discharge: 2017-04-04 | Disposition: A | Discharge status: Home / Self Care | Payer: Medicaid Other | Attending: Emergency Medicine | Admitting: Emergency Medicine

## 2017-04-04 DIAGNOSIS — R1013 Epigastric pain: Secondary | ICD-10-CM | POA: Diagnosis not present

## 2017-04-04 DIAGNOSIS — Z87891 Personal history of nicotine dependence: Secondary | ICD-10-CM | POA: Diagnosis not present

## 2017-04-04 DIAGNOSIS — R197 Diarrhea, unspecified: Secondary | ICD-10-CM | POA: Insufficient documentation

## 2017-04-04 DIAGNOSIS — R1011 Right upper quadrant pain: Secondary | ICD-10-CM | POA: Diagnosis not present

## 2017-04-04 DIAGNOSIS — Z79899 Other long term (current) drug therapy: Secondary | ICD-10-CM | POA: Insufficient documentation

## 2017-04-04 LAB — URINALYSIS, COMPLETE (UACMP) WITH MICROSCOPIC
Bacteria, UA: NONE SEEN
Bilirubin Urine: NEGATIVE
GLUCOSE, UA: NEGATIVE mg/dL
HGB URINE DIPSTICK: NEGATIVE
Ketones, ur: NEGATIVE mg/dL
NITRITE: NEGATIVE
Protein, ur: NEGATIVE mg/dL
SPECIFIC GRAVITY, URINE: 1.019 (ref 1.005–1.030)
pH: 5 (ref 5.0–8.0)

## 2017-04-04 LAB — CBC
HCT: 45.7 % (ref 35.0–47.0)
HEMOGLOBIN: 15.8 g/dL (ref 12.0–16.0)
MCH: 30.9 pg (ref 26.0–34.0)
MCHC: 34.6 g/dL (ref 32.0–36.0)
MCV: 89.4 fL (ref 80.0–100.0)
Platelets: 190 10*3/uL (ref 150–440)
RBC: 5.11 MIL/uL (ref 3.80–5.20)
RDW: 13.4 % (ref 11.5–14.5)
WBC: 8.4 10*3/uL (ref 3.6–11.0)

## 2017-04-04 LAB — COMPREHENSIVE METABOLIC PANEL
ALBUMIN: 4.2 g/dL (ref 3.5–5.0)
ALT: 21 U/L (ref 14–54)
ANION GAP: 7 (ref 5–15)
AST: 18 U/L (ref 15–41)
Alkaline Phosphatase: 80 U/L (ref 38–126)
BUN: 22 mg/dL — ABNORMAL HIGH (ref 6–20)
CHLORIDE: 108 mmol/L (ref 101–111)
CO2: 20 mmol/L — AB (ref 22–32)
CREATININE: 0.7 mg/dL (ref 0.44–1.00)
Calcium: 8.9 mg/dL (ref 8.9–10.3)
GFR calc non Af Amer: >60 mL/min (ref >60)
GLUCOSE: 109 mg/dL — AB (ref 65–99)
Potassium: 4.1 mmol/L (ref 3.5–5.1)
SODIUM: 135 mmol/L (ref 135–145)
Total Bilirubin: 0.8 mg/dL (ref 0.3–1.2)
Total Protein: 7 g/dL (ref 6.5–8.1)

## 2017-04-04 LAB — TROPONIN I: Troponin I: <0.03 ng/mL (ref <0.03)

## 2017-04-04 LAB — LIPASE, BLOOD: LIPASE: 29 U/L (ref 11–51)

## 2017-04-04 LAB — POCT PREGNANCY, URINE: Preg Test, Ur: NEGATIVE

## 2017-04-04 MED ORDER — DIPHENHYDRAMINE HCL 50 MG/ML IJ SOLN
25.0000 mg | Freq: Once | INTRAMUSCULAR | Status: AC
  Administered 2017-04-04: 25 mg via INTRAVENOUS

## 2017-04-04 MED ORDER — DIPHENHYDRAMINE HCL 50 MG/ML IJ SOLN
INTRAMUSCULAR | Status: AC
  Filled 2017-04-04: qty 1

## 2017-04-04 MED ORDER — TRAMADOL HCL 50 MG PO TABS: 50.0000 mg | ORAL_TABLET | Freq: Four times a day (QID) | ORAL | 0 refills | Status: DC | PRN

## 2017-04-04 MED ORDER — ONDANSETRON HCL 4 MG/2ML IJ SOLN
4.0000 mg | Freq: Once | INTRAMUSCULAR | Status: AC
  Administered 2017-04-04: 4 mg via INTRAVENOUS
  Filled 2017-04-04: qty 2

## 2017-04-04 MED ORDER — IOPAMIDOL (ISOVUE-300) INJECTION 61%
30.0000 mL | Freq: Once | INTRAVENOUS | Status: AC | PRN
  Administered 2017-04-04: 30 mL via ORAL

## 2017-04-04 MED ORDER — SODIUM CHLORIDE 0.9 % IV BOLUS (SEPSIS)
1000.0000 mL | Freq: Once | INTRAVENOUS | Status: AC
  Administered 2017-04-04: 1000 mL via INTRAVENOUS

## 2017-04-04 MED ORDER — ALUM & MAG HYDROXIDE-SIMETH 200-200-20 MG/5ML PO SUSP
30.0000 mL | Freq: Once | ORAL | Status: AC
  Administered 2017-04-04: 30 mL via ORAL
  Filled 2017-04-04: qty 30

## 2017-04-04 MED ORDER — ONDANSETRON HCL 4 MG PO TABS: 4.0000 mg | ORAL_TABLET | Freq: Three times a day (TID) | ORAL | 0 refills | Status: DC | PRN

## 2017-04-04 MED ORDER — MORPHINE SULFATE (PF) 4 MG/ML IV SOLN
4.0000 mg | Freq: Once | INTRAVENOUS | Status: AC
  Administered 2017-04-04: 4 mg via INTRAVENOUS
  Filled 2017-04-04: qty 1

## 2017-04-04 MED ORDER — IOPAMIDOL (ISOVUE-370) INJECTION 76%
75.0000 mL | Freq: Once | INTRAVENOUS | Status: AC | PRN
  Administered 2017-04-04: 75 mL via INTRAVENOUS

## 2017-04-04 NOTE — ED Triage Notes (Signed)
Patient ambulatory to triage with steady gait, without difficulty or distress noted; pt reports x week right sided upper abd pain radiating into back accomp by N/V/D with foul smelling belching; post c-section wk ago

## 2017-04-04 NOTE — ED Provider Notes (Signed)
[facility] Emergency Department Provider Note ____________________________________________   I have reviewed the triage vital signs and the triage nursing note.  HISTORY  Chief Complaint Abdominal Pain   Historian Patient  HPI Sandy [name] is a 26 y.o. female presents with epigastric and right upper quadrant pain that started last night around 3 AM and was severe and stabbing as well as burning, and then eased off and she woke back up again around 5 AM with severe stabbing epigastric and right upper quadrant pain that went through the back.  She states she is actually had a few episodes like this but not as severe over the past several weeks.  When she eats she feels nauseated.  She has been nausea when she eats since Christmas Eve.  Family history with parents and grandparents that had her gallbladder out.  No fevers.  No chest pain or palpitations.  No lower abdominal pain.  No urinary symptoms.  No vaginal or pelvic symptoms.    Past Medical History:  Diagnosis Date  . Bicornate uterus   . Complication of anesthesia    itching after C-Sections  . Family history of adverse reaction to anesthesia    uncle has a hard time waking up  . Genital HSV   . GERD (gastroesophageal reflux disease)   . Obesity affecting pregnancy    BMI>40    Patient Active Problem List   Diagnosis Date Noted  . Status post cesarean delivery 12/18/2016  . Indication for care in labor and delivery, antepartum 12/08/2016  . Placental abruption, antepartum, third trimester 11/28/2016  . High risk pregnancy in young multigravida, third trimester 11/28/2016  . Hx of preeclampsia, prior pregnancy, currently pregnant 11/28/2016  . History of cesarean section complicating pregnancy 11/28/2016  . BMI 40.0-44.9, adult (HCC) 11/21/2016  . 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 Obesity complicating pregnancy, third trimester 06/04/2016  . HSV-2 (herpes simplex virus 2) infection  06/11/2010    Past Surgical History:  Procedure Laterality Date  . CESAREAN SECTION  2012   for HSV outbreak  . CESAREAN SECTION N/A 11/29/2015   Procedure: CESAREAN SECTION Baby Girl 7lb.[name];  Surgeon: Vena Austria, MD;  Location: [facility] ORS;  Service: Obstetrics;  Laterality: N/A;  . CESAREAN SECTION N/A 12/18/2016   Procedure: CESAREAN SECTION;  Surgeon: Conard Novak, MD;  Location: [facility] ORS;  Service: Obstetrics;  Laterality: N/A;  . ears Bilateral    tubes  . ESOPHAGOGASTRODUODENOSCOPY (EGD) WITH PROPOFOL N/A 05/04/2016   Procedure: ESOPHAGOGASTRODUODENOSCOPY (EGD) WITH PROPOFOL;  Surgeon: Midge Minium, MD;  Location: [facility];  Service: Endoscopy;  Laterality: N/A;  . TONSILLECTOMY      Prior to Admission medications   Medication Sig Start Date End Date Taking? Authorizing Provider  ibuprofen (ADVIL,MOTRIN) 600 MG tablet Take 1,200 mg by mouth every 6 (six) hours as needed.   Yes [provider]  norethindrone (MICRONOR,CAMILA,ERRIN) 0.35 MG tablet Take 1 tablet (0.35 mg total) by mouth daily. Start taking in 5 days. Patient not taking: Reported on 04/04/2017 12/22/16   Oswaldo Conroy, CNM  ondansetron (ZOFRAN) 4 MG tablet Take 1 tablet (4 mg total) by mouth every 8 (eight) hours as needed for nausea or vomiting. 04/04/17   Governor Rooks, MD  Prenatal Vit-Fe Fumarate-FA (MULTIVITAMIN-PRENATAL) 27-0.8 MG TABS tablet Take 1 tablet by mouth daily at 12 noon.    [provider]  traMADol (ULTRAM) 50 MG tablet Take 1 tablet (50 mg total) by mouth every 6 (six)  hours as needed. 04/04/17   Governor RooksLord, Saim Almanza, MD    Allergies  Allergen Reactions  . Oxycodone-Acetaminophen Itching  . Fluticasone Swelling    Mouth swelling  . Percocet [Oxycodone-Acetaminophen] Itching  . Tape     Some tapes cause blisters    Family History  Problem Relation Age of Onset  . Diabetes Maternal Aunt   . Hyperlipidemia Maternal Aunt     Social History Social  History   Tobacco Use  . Smoking status: Former Smoker    Last attempt to quit: 04/09/2013    Years since quitting: 3.9  . Smokeless tobacco: Never Used  Substance Use Topics  . Alcohol use: Yes    Comment: 2x/month, none during pregnacy  . Drug use: No    Review of Systems  Constitutional: Negative for fever. Eyes: Negative for visual changes. ENT: Negative for sore throat. Cardiovascular: Negative for chest pain. Respiratory: Negative for shortness of breath. Gastrointestinal: Positive for mild diarrhea.  Nausea and abdominal pain as per HPI. Genitourinary: Negative for dysuria. Musculoskeletal: At times has pain through to the right middle of the back with the right upper quadrant pain. Skin: Negative for rash. Neurological: Negative for headache.  ____________________________________________   PHYSICAL EXAM:  VITAL SIGNS: ED Triage Vitals  Enc Vitals Group     BP 04/04/17 0621 116/71     Pulse Rate 04/04/17 0621 (!) 104     Resp 04/04/17 0621 20     Temp 04/04/17 0621 98.2 F (36.8 C)     Temp Source 04/04/17 0621 Oral     SpO2 04/04/17 0621 98 %     Weight 04/04/17 0619 215 lb (97.5 kg)     Height 04/04/17 0619 5\' 5"  (1.651 m)     Head Circumference --      Peak Flow --      Pain Score 04/04/17 0618 8     Pain Loc --      Pain Edu? --      Excl. in GC? --      Constitutional: Alert and oriented. Well appearing and in no distress. HEENT   Head: Normocephalic and atraumatic.      Eyes: Conjunctivae are normal. Pupils equal and round.       Ears:         Nose: No congestion/rhinnorhea.   Mouth/Throat: Mucous membranes are moist.   Neck: No stridor. Cardiovascular/Chest: Normal rate, regular rhythm.  No murmurs, rubs, or gallops. Respiratory: Normal respiratory effort without tachypnea nor retractions. Breath sounds are clear and equal bilaterally. No wheezes/rales/rhonchi. Gastrointestinal: Soft. No distention, no guarding, no rebound.  Obese.   Pain in epigastrium and right upper quadrant.  No lower abdominal tenderness or tenderness at McBurney's point. Genitourinary/rectal:Deferred Musculoskeletal: Nontender with normal range of motion in all extremities. No joint effusions.  No lower extremity tenderness.  No edema. Neurologic:  Normal speech and language. No gross or focal neurologic deficits are appreciated. Skin:  Skin is warm, dry and intact. No rash noted. Psychiatric: Mood and affect are normal. Speech and behavior are normal. Patient exhibits appropriate insight and judgment.   ____________________________________________  LABS (pertinent positives/negatives) I, Governor Rooksebecca Jaqua Ching, MD the attending physician have reviewed the labs noted below.  Labs Reviewed  COMPREHENSIVE METABOLIC PANEL - Abnormal; Notable for the following components:      Result Value   CO2 20 (*)    Glucose, Bld 109 (*)    BUN 22 (*)    All other components within normal  limits  URINALYSIS, COMPLETE (UACMP) WITH MICROSCOPIC - Abnormal; Notable for the following components:   Color, Urine YELLOW (*)    APPearance CLEAR (*)    Leukocytes, UA SMALL (*)    Squamous Epithelial / LPF 0-5 (*)    All other components within normal limits  LIPASE, BLOOD  CBC  TROPONIN I  POC URINE PREG, ED  POCT PREGNANCY, URINE    ____________________________________________    EKG I, Governor Rooksebecca Kahron Kauth, MD, the attending physician have personally viewed and interpreted all ECGs.  75 bpm.  Sinus arrhythmia.  Narrow transfer normal axis.  Normal ST and T wave ____________________________________________  RADIOLOGY All Xrays were viewed by me.  Imaging interpreted by Radiologist, and I, Governor Rooksebecca Zeek Rostron, MD the attending physician have reviewed the radiologist interpretation noted below.  Ultrasound right upper quadrant:  IMPRESSION: Within normal limits.  CT abdomen pelvis with contrast:  IMPRESSION: 1. Scattered fluid levels in the large bowel compatible  with diarrheal/malabsorptive state. No bowel wall thickening. No bowel obstruction. Normal appendix. 2. Mild splenomegaly, nonspecific. No abdominopelvic adenopathy. __________________________________________  PROCEDURES  Procedure(s) performed: None  Critical Care performed: None   ____________________________________________  ED COURSE / ASSESSMENT AND PLAN  Pertinent labs & imaging results that were available during my care of the patient were reviewed by me and considered in my medical decision making (see chart for details).   Patient is overall well-appearing but giving a history of nausea within 1-3 hours after eating for a couple days, now several episodes of moderate to severe right upper quadrant pain that sounds most likely clinically consistent with biliary colic.  Ultrasound reported normal.  I discussed with patient obtaining CT scan abdomen given severity of pain, and she wishes to proceed after discussion of risk versus benefit.  CT scan shows no significant emergency condition, but does show signs of diarrhea.  Discussed with patient, uncertain etiology of the mostly epigastric and right-sided pain, but does not appear to be emergency surgical or medical in the emergency department today.  Discussed symptomatic treatment with Maalox for possible gastritis component, tramadol if needed for severe discomfort, and Zofran for nausea.   DIFFERENTIAL DIAGNOSIS:Differential diagnosis includes, but is not limited to, biliary disease (biliary colic, acute cholecystitis, cholangitis, choledocholithiasis, etc), intrathoracic causes for epigastric abdominal pain including ACS, gastritis, duodenitis, pancreatitis, small bowel or large bowel obstruction, abdominal aortic aneurysm, hernia, and gastritis.  CONSULTATIONS:  None  Patient / Family / Caregiver informed of clinical course, medical decision-making process, and agree with plan.   I discussed return precautions,  follow-up instructions, and discharge instructions with patient and/or family.  Discharge Instructions : You are evaluated for nausea and abdominal pain, and although no certain cause was found, your exam and evaluation are overall reassuring in the emergency department today.  Return to the emergency room immediately for any worsening, vomiting blood, fever, chest pain, trouble breathing, or any other symptoms concerning to you.    ___________________________________________   FINAL CLINICAL IMPRESSION(S) / ED DIAGNOSES   Final diagnoses:  RUQ pain  Diarrhea, unspecified type      ___________________________________________        Note: This dictation was prepared with Dragon dictation. Any transcriptional errors that result from this process are unintentional    Governor RooksLord, Rondal Vandevelde, MD 04/04/17 1121

## 2017-04-04 NOTE — ED Notes (Signed)
Pt given discharge instructions - pt states she usually itches with pain medicine and asked about benadryl. Advised she could take otc if needed but since she is caring for a newborn to make sure someone in the home can watch them (the baby) in case it makes her so tired she falls asleep.

## 2017-04-04 NOTE — Discharge Instructions (Signed)
You are evaluated for nausea and abdominal pain, and although no certain cause was found, your exam and evaluation are overall reassuring in the emergency department today.  Return to the emergency room immediately for any worsening, vomiting blood, fever, chest pain, trouble breathing, or any other symptoms concerning to you.

## 2017-04-29 ENCOUNTER — Emergency Department
Admission: EM | Admit: 2017-04-29 | Discharge: 2017-04-29 | Disposition: A | Discharge status: Home / Self Care | Payer: Self-pay | Attending: Student in an Organized Health Care Education/Training Program | Admitting: Student in an Organized Health Care Education/Training Program

## 2017-04-29 ENCOUNTER — Encounter: Payer: Self-pay | Admitting: Emergency Medicine

## 2017-04-29 ENCOUNTER — Emergency Department: Payer: Self-pay

## 2017-04-29 ENCOUNTER — Other Ambulatory Visit: Payer: Self-pay

## 2017-04-29 DIAGNOSIS — Z87891 Personal history of nicotine dependence: Secondary | ICD-10-CM | POA: Insufficient documentation

## 2017-04-29 DIAGNOSIS — W19XXXA Unspecified fall, initial encounter: Secondary | ICD-10-CM

## 2017-04-29 DIAGNOSIS — Y9389 Activity, other specified: Secondary | ICD-10-CM | POA: Insufficient documentation

## 2017-04-29 DIAGNOSIS — Y92009 Unspecified place in unspecified non-institutional (private) residence as the place of occurrence of the external cause: Secondary | ICD-10-CM | POA: Insufficient documentation

## 2017-04-29 DIAGNOSIS — W108XXA Fall (on) (from) other stairs and steps, initial encounter: Secondary | ICD-10-CM | POA: Insufficient documentation

## 2017-04-29 DIAGNOSIS — Z79899 Other long term (current) drug therapy: Secondary | ICD-10-CM | POA: Insufficient documentation

## 2017-04-29 DIAGNOSIS — S9032XA Contusion of left foot, initial encounter: Secondary | ICD-10-CM | POA: Insufficient documentation

## 2017-04-29 DIAGNOSIS — Y999 Unspecified external cause status: Secondary | ICD-10-CM | POA: Insufficient documentation

## 2017-04-29 DIAGNOSIS — S93602A Unspecified sprain of left foot, initial encounter: Secondary | ICD-10-CM | POA: Insufficient documentation

## 2017-04-29 MED ORDER — NAPROXEN 500 MG PO TABS: 500.0000 mg | ORAL_TABLET | Freq: Two times a day (BID) | ORAL | 0 refills | Status: AC

## 2017-04-29 NOTE — ED Triage Notes (Signed)
Pt to ed with c/o left foot pain after fall on Thursday.  Pt states she fell down multiple steps after tripping.  Bruising noted to left foot.

## 2017-04-29 NOTE — ED Provider Notes (Signed)
[facility] Emergency Department Provider Note ____________________________________________  Time seen: 1407  I have reviewed the triage vital signs and the nursing notes.  HISTORY  Chief Complaint  Fall and Foot Pain  HPI Sandy [name] is a 27 y.o. female presents herself to the ED for evaluation and management of left foot and leg pain, after a fall. She admits to a mechanical fall at home on Thursday. She was drinking, when she fell down the steps. She denies any head injury, LOC, nausea, or vomiting. She presents now with multiple superficial bruises to the foot and leg. She has not initiated any pain relieving measures, since the accident. She presents now for evaluation of her foot pain.  Past Medical History:  Diagnosis Date  . Bicornate uterus   . Complication of anesthesia    itching after C-Sections  . Family history of adverse reaction to anesthesia    uncle has a hard time waking up  . Genital HSV   . GERD (gastroesophageal reflux disease)   . Obesity affecting pregnancy    BMI>40    Patient Active Problem List   Diagnosis Date Noted  . Status post cesarean delivery 12/18/2016  . Indication for care in labor and delivery, antepartum 12/08/2016  . Placental abruption, antepartum, third trimester 11/28/2016  . High risk pregnancy in young multigravida, third trimester 11/28/2016  . Hx of preeclampsia, prior pregnancy, currently pregnant 11/28/2016  . History of cesarean section complicating pregnancy 11/28/2016  . BMI 40.0-44.9, adult (HCC) 11/21/2016  . 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 Obesity complicating pregnancy, third trimester 06/04/2016  . 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 CESAREAN SECTION  2012   for HSV outbreak  . CESAREAN SECTION N/A 11/29/2015   Procedure: CESAREAN SECTION Baby Girl 7lb.[name];  Surgeon: Vena Austria, MD;  Location: [facility];   Service: Obstetrics;  Laterality: N/A;  . CESAREAN SECTION N/A 12/18/2016   Procedure: CESAREAN SECTION;  Surgeon: Conard Novak, MD;  Location: [facility] OR[facility];  Service: Obstetrics;  Laterality: N/A;  . ears Bilateral    tubes  . ESOPHAGOGASTRODUODENOSCOPY (EGD) WITH PROPOFOL N/A 05/04/2016   Procedure: ESOPHAGOGASTRODUODENOSCOPY (EGD) WITH PROPOFOL;  Surgeon: Midge Minium, MD;  Location: [facility];  Service: Endoscopy;  Laterality: N/A;  . TONSILLECTOMY      Prior to Admission medications   Medication Sig Start Date End Date Taking? Authorizing Provider  ibuprofen (ADVIL,MOTRIN) 600 MG tablet Take 1,200 mg by mouth every 6 (six) hours as needed.    [provider]  naproxen (NAPROSYN) 500 MG tablet Take 1 tablet (500 mg total) by mouth 2 (two) times daily with a meal for 15 days. 04/29/17 05/14/17  See Beharry, Charlesetta Ivory, PA-C  norethindrone (MICRONOR,CAMILA,ERRIN) 0.35 MG tablet Take 1 tablet (0.35 mg total) by mouth daily. Start taking in 5 days. Patient not taking: Reported on 04/04/2017 12/22/16   Oswaldo Conroy, CNM  ondansetron (ZOFRAN) 4 MG tablet Take 1 tablet (4 mg total) by mouth every 8 (eight) hours as needed for nausea or vomiting. 04/04/17   Governor Rooks, MD  Prenatal Vit-Fe Fumarate-FA (MULTIVITAMIN-PRENATAL) 27-0.8 MG TABS tablet Take 1 tablet by mouth daily at 12 noon.    [provider]  traMADol (ULTRAM) 50 MG tablet Take 1 tablet (50 mg total) by mouth every 6 (six) hours as needed. 04/04/17   Governor Rooks, MD    Allergies Oxycodone-acetaminophen; Fluticasone; Percocet [oxycodone-acetaminophen];  and Tape  Family History  Problem Relation Age of Onset  . Diabetes Maternal Aunt   . Hyperlipidemia Maternal Aunt     Social History Social History   Tobacco Use  . Smoking status: Former Smoker    Last attempt to quit: 04/09/2013    Years since quitting: 4.0  . Smokeless tobacco: Never Used  Substance Use Topics  . Alcohol use: Yes     Comment: 2x/month, none during pregnacy  . Drug use: No    Review of Systems  Constitutional: Negative for fever. Eyes: Negative for visual changes. ENT: Negative for sore throat. Cardiovascular: Negative for chest pain. Respiratory: Negative for shortness of breath. Gastrointestinal: Negative for abdominal pain, vomiting and diarrhea. Musculoskeletal: Negative for back pain. Left foot and leg pain and bruising.  Skin: Negative for rash. Neurological: Negative for headaches, focal weakness or numbness. ____________________________________________  PHYSICAL EXAM:  VITAL SIGNS: ED Triage Vitals [04/29/17 1349]  Enc Vitals Group     BP (!) 127/58     Pulse Rate 72     Resp 18     Temp 98.5 F (36.9 C)     Temp Source Oral     SpO2 100 %     Weight 215 lb (97.5 kg)     Height 5\' 5"  (1.651 m)     Head Circumference      Peak Flow      Pain Score 7     Pain Loc      Pain Edu?      Excl. in GC?     Constitutional: Alert and oriented. Well appearing and in no distress. Head: Normocephalic and atraumatic. Eyes: Conjunctivae are normal. Normal extraocular movements Cardiovascular: Normal rate, regular rhythm. Normal distal pulses. Respiratory: Normal respiratory effort. Musculoskeletal: Left foot without obvious deformity, dislocation, or effusion. There is bruising noted to the medial foot and both medial and lateral lower leg. Normal ankle exam without laxity. Nontender with normal range of motion in all extremities.  Neurologic:  Mildly antalgic gait without ataxia. Normal speech and language. No gross focal neurologic deficits are appreciated. Skin:  Skin is warm, dry and intact. No rash noted. ____________________________________________   RADIOLOGY  Left Foot IMPRESSION: Negative.  I, Devaris Quirk, Charlesetta IvoryJenise V Bacon, personally viewed and evaluated these images (plain radiographs) as part of my medical decision making, as well as reviewing the written report by the  radiologist. ____________________________________________  PROCEDURES  Procedures Ace bandage - left foot ____________________________________________  INITIAL IMPRESSION / ASSESSMENT AND PLAN / ED COURSE  Patient with ED evaluation of injuries to the LLE following a mechanical fall at home, 4 days prior. Her x-ray is negative. She has an otherwise stable exam. Her injuries are consistent with a fall resulting in a foot sprain and multiple contusions. She is placed in an ace bandage for support. A prescription for naproxen is provided. She will rest, ice, and elevate the foot for comfort.  ____________________________________________  FINAL CLINICAL IMPRESSION(S) / ED DIAGNOSES  Final diagnoses:  Fall in home, initial encounter  Sprain of left foot, initial encounter  Contusion of left foot, initial encounter     Lissa HoardMenshew, Honor Frison V Bacon, PA-C 04/29/17 1710    Willy Eddyobinson, Patrick, MD 04/29/17 1945

## 2017-04-29 NOTE — ED Notes (Addendum)
Pt reports was drinking and fell down 12 stairs last night.  Bruising and swelling to left calf. Bruising to left foot. Hit head. Reports started vomiting then passed out but thinks was from the alcohol not fall.  No obvious deformity

## 2017-04-29 NOTE — Discharge Instructions (Signed)
Your exam and x-ray are negative for fracture or dislocation. You have multiple contusions (bruises) to the foot and leg. Apply ice and/or moist heat to reduce bruising. Take naproxen for pain and inflammation. [facility] with [facility] for continued symptoms.

## 2017-09-03 ENCOUNTER — Emergency Department
Admission: EM | Admit: 2017-09-03 | Discharge: 2017-09-03 | Disposition: A | Discharge status: Home / Self Care | Payer: Medicaid Other | Attending: Emergency Medicine | Admitting: Emergency Medicine

## 2017-09-03 ENCOUNTER — Other Ambulatory Visit: Payer: Self-pay

## 2017-09-03 ENCOUNTER — Encounter: Payer: Self-pay | Admitting: Emergency Medicine

## 2017-09-03 DIAGNOSIS — R1013 Epigastric pain: Secondary | ICD-10-CM | POA: Insufficient documentation

## 2017-09-03 DIAGNOSIS — Z79899 Other long term (current) drug therapy: Secondary | ICD-10-CM | POA: Insufficient documentation

## 2017-09-03 DIAGNOSIS — Z87891 Personal history of nicotine dependence: Secondary | ICD-10-CM | POA: Insufficient documentation

## 2017-09-03 LAB — CBC
HEMATOCRIT: 44 % (ref 35.0–47.0)
Hemoglobin: 15.3 g/dL (ref 12.0–16.0)
MCH: 29.3 pg (ref 26.0–34.0)
MCHC: 34.8 g/dL (ref 32.0–36.0)
MCV: 84.1 fL (ref 80.0–100.0)
Platelets: 190 10*3/uL (ref 150–440)
RBC: 5.23 MIL/uL — ABNORMAL HIGH (ref 3.80–5.20)
RDW: 14.7 % — AB (ref 11.5–14.5)
WBC: 7 10*3/uL (ref 3.6–11.0)

## 2017-09-03 LAB — COMPREHENSIVE METABOLIC PANEL
ALBUMIN: 4.3 g/dL (ref 3.5–5.0)
ALK PHOS: 89 U/L (ref 38–126)
ALT: 13 U/L — ABNORMAL LOW (ref 14–54)
ANION GAP: 6 (ref 5–15)
AST: 18 U/L (ref 15–41)
BUN: 21 mg/dL — AB (ref 6–20)
CALCIUM: 8.7 mg/dL — AB (ref 8.9–10.3)
CO2: 24 mmol/L (ref 22–32)
Chloride: 107 mmol/L (ref 101–111)
Creatinine, Ser: 0.76 mg/dL (ref 0.44–1.00)
GFR calc Af Amer: >60 mL/min (ref >60)
GFR calc non Af Amer: >60 mL/min (ref >60)
GLUCOSE: 105 mg/dL — AB (ref 65–99)
POTASSIUM: 3.9 mmol/L (ref 3.5–5.1)
SODIUM: 137 mmol/L (ref 135–145)
TOTAL PROTEIN: 7.1 g/dL (ref 6.5–8.1)
Total Bilirubin: 0.7 mg/dL (ref 0.3–1.2)

## 2017-09-03 LAB — LIPASE, BLOOD: Lipase: 31 U/L (ref 11–51)

## 2017-09-03 MED ORDER — SUCRALFATE 1 G PO TABS
1.0000 g | ORAL_TABLET | Freq: Once | ORAL | Status: AC
  Administered 2017-09-03: 1 g via ORAL
  Filled 2017-09-03: qty 1

## 2017-09-03 MED ORDER — FAMOTIDINE 20 MG PO TABS: 20.0000 mg | ORAL_TABLET | Freq: Two times a day (BID) | ORAL | 0 refills | Status: DC

## 2017-09-03 MED ORDER — SUCRALFATE 1 G PO TABS: 1.0000 g | ORAL_TABLET | Freq: Four times a day (QID) | ORAL | 1 refills | Status: DC

## 2017-09-03 MED ORDER — DICYCLOMINE HCL 10 MG PO CAPS
10.0000 mg | ORAL_CAPSULE | Freq: Once | ORAL | Status: AC
  Administered 2017-09-03: 10 mg via ORAL
  Filled 2017-09-03: qty 1

## 2017-09-03 NOTE — ED Provider Notes (Addendum)
[facility] Emergency Department Provider Note  ____________________________________________  Time seen: Approximately 11:05 AM  I have reviewed the triage vital signs and the nursing notes.   HISTORY  Chief Complaint Abdominal Pain and Chest Pain    HPI Sandy [name] is a 27 y.o. female who complains of upper abdominal pain radiating around to the back, been present for last 24 hours into episodes, both after she ate a large meal of steak. No vomiting, no fevers chills or diarrhea. No constipation. Does have a family history of biliary disease. Symptoms are severe At worst, constant, no aggravating or alleviating factors.        Past Medical History:  Diagnosis Date  . Bicornate uterus   . Complication of anesthesia    itching after C-Sections  . Family history of adverse reaction to anesthesia    uncle has a hard time waking up  . Genital HSV   . GERD (gastroesophageal reflux disease)   . Obesity affecting pregnancy    BMI>40     Patient Active Problem List   Diagnosis Date Noted  . Status post cesarean delivery 12/18/2016  . Indication for care in labor and delivery, antepartum 12/08/2016  . Placental abruption, antepartum, third trimester 11/28/2016  . High risk pregnancy in young multigravida, third trimester 11/28/2016  . Hx of preeclampsia, prior pregnancy, currently pregnant 11/28/2016  . History of cesarean section complicating pregnancy 11/28/2016  . BMI 40.0-44.9, adult (HCC) 11/21/2016  . 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 Obesity complicating pregnancy, third trimester 06/04/2016  . 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     Past Surgical History:  Procedure Laterality Date  . CESAREAN SECTION  2012   for HSV outbreak  . CESAREAN SECTION N/A 11/29/2015   Procedure: CESAREAN SECTION Baby Girl 7lb.[name];  Surgeon: Vena Austria, MD;  Location: [facility] OR[facility];  Service: Obstetrics;  Laterality: N/A;  . CESAREAN SECTION  N/A 12/18/2016   Procedure: CESAREAN SECTION;  Surgeon: Conard Novak, MD;  Location: [facility] OR[facility];  Service: Obstetrics;  Laterality: N/A;  . ears Bilateral    tubes  . ESOPHAGOGASTRODUODENOSCOPY (EGD) WITH PROPOFOL N/A 05/04/2016   Procedure: ESOPHAGOGASTRODUODENOSCOPY (EGD) WITH PROPOFOL;  Surgeon: Midge Minium, MD;  Location: [facility];  Service: Endoscopy;  Laterality: N/A;  . TONSILLECTOMY       Prior to Admission medications   Medication Sig Start Date End Date Taking? Authorizing Provider  famotidine (PEPCID) 20 MG tablet Take 1 tablet (20 mg total) by mouth 2 (two) times daily. 09/03/17   Sharman Cheek, MD  ibuprofen (ADVIL,MOTRIN) 600 MG tablet Take 1,200 mg by mouth every 6 (six) hours as needed.    [provider]  norethindrone (MICRONOR,CAMILA,ERRIN) 0.35 MG tablet Take 1 tablet (0.35 mg total) by mouth daily. Start taking in 5 days. Patient not taking: Reported on 04/04/2017 12/22/16   Oswaldo Conroy, CNM  ondansetron (ZOFRAN) 4 MG tablet Take 1 tablet (4 mg total) by mouth every 8 (eight) hours as needed for nausea or vomiting. 04/04/17   Governor Rooks, MD  Prenatal Vit-Fe Fumarate-FA (MULTIVITAMIN-PRENATAL) 27-0.8 MG TABS tablet Take 1 tablet by mouth daily at 12 noon.    [provider]  sucralfate (CARAFATE) 1 g tablet Take 1 tablet (1 g total) by mouth 4 (four) times daily. 09/03/17   Sharman Cheek, MD  traMADol (ULTRAM) 50 MG tablet Take 1 tablet (50 mg total) by mouth every 6 (six) hours as needed. 04/04/17   Governor Rooks,  MD     Allergies Oxycodone-acetaminophen; Fluticasone; Percocet [oxycodone-acetaminophen]; and Tape   Family History  Problem Relation Age of Onset  . Diabetes Maternal Aunt   . Hyperlipidemia Maternal Aunt     Social History Social History   Tobacco Use  . Smoking status: Former Smoker    Last attempt to quit: 04/09/2013    Years since quitting: 4.4  . Smokeless tobacco: Never Used  Substance Use  Topics  . Alcohol use: Yes    Comment: 2x/month, none during pregnacy  . Drug use: No    Review of Systems  Constitutional:   No fever or chills.  ENT:   No sore throat. No rhinorrhea. Cardiovascular:   No chest pain or syncope. Respiratory:   No dyspnea or cough. Gastrointestinal:   positive as above for abdominal pain without vomiting and diarrhea.  Musculoskeletal:   Negative for focal pain or swelling All other systems reviewed and are negative except as documented above in ROS and HPI.  ____________________________________________   PHYSICAL EXAM:  VITAL SIGNS: ED Triage Vitals  Enc Vitals Group     BP 09/03/17 0652 124/84     Pulse Rate 09/03/17 0652 87     Resp 09/03/17 0652 18     Temp 09/03/17 0652 98.5 F (36.9 C)     Temp Source 09/03/17 0652 Oral     SpO2 09/03/17 0652 95 %     Weight 09/03/17 0642 215 lb (97.5 kg)     Height 09/03/17 0642  (1.651 m)     Head Circumference --      Peak Flow --      Pain Score 09/03/17 0642 10     Pain Loc --      Pain Edu? --      Excl. in GC? --     Vital signs reviewed, nursing assessments reviewed.   Constitutional:   Alert and oriented. Well appearing and in no distress.calm Eyes:   Conjunctivae are normal. EOMI. PERRL. ENT      Head:   Normocephalic and atraumatic.      Nose:   No congestion/rhinnorhea.       Mouth/Throat:   MMM, no pharyngeal erythema. No peritonsillar mass.       Neck:   No meningismus. Full ROM. Hematological/Lymphatic/Immunilogical:   No cervical lymphadenopathy. Cardiovascular:   RRR. Symmetric bilateral radial and DP pulses.  No murmurs.  Respiratory:   Normal respiratory effort without tachypnea/retractions. Breath sounds are clear and equal bilaterally. No wheezes/rales/rhonchi. Gastrointestinal:   Soft with left upper quadrant and epigastric tenderness. Non distended. There is no CVA tenderness.  No rebound, rigidity, or guarding. Musculoskeletal:   Normal range of motion in all  extremities. No joint effusions.  No lower extremity tenderness.  No edema. Neurologic:   Normal speech and language.  Motor grossly intact. No acute focal neurologic deficits are appreciated.  Skin:    Skin is warm, dry and intact. No rash noted.  No petechiae, purpura, or bullae.  ____________________________________________    LABS (pertinent positives/negatives) (all labs ordered are listed, but only abnormal results are displayed) Labs Reviewed  COMPREHENSIVE METABOLIC PANEL - Abnormal; Notable for the following components:      Result Value   Glucose, Bld 105 (*)    BUN 21 (*)    Calcium 8.7 (*)    ALT 13 (*)    All other components within normal limits  CBC - Abnormal; Notable for the following components:   RBC  5.23 (*)    RDW 14.7 (*)    All other components within normal limits  LIPASE, BLOOD  URINALYSIS, COMPLETE (UACMP) WITH MICROSCOPIC  POC URINE PREG, ED   ____________________________________________   EKG Interpreted by me Normal sinus rhythm, rate of 82. Nl axis, intervals, qrs, st segments, and t waves.    ____________________________________________    RADIOLOGY  No results found.  ____________________________________________   PROCEDURES Procedures  ____________________________________________  DIFFERENTIAL DIAGNOSIS   pancreatitis, choledocholithiasis, gastritis  CLINICAL IMPRESSION / ASSESSMENT AND PLAN / ED COURSE  Pertinent labs & imaging results that were available during my care of the patient were reviewed by me and considered in my medical decision making (see chart for details).    patient well-appearing no acute distress, normal vital signs, upper abdominal tenderness. Does have a long history of this, dating back at least to January 2018 at which time she had an upper endoscopy which showed some mild gastritis evidence. In that time she has been pregnant and now delivered 8 months ago, she certainly could've worsened gastritis  and GERD. Labs are unremarkable. Review of electronic medical records shows that she has had ultrasound and CT scan of her abdomen in the last several months and I don't think there is a benefit to repeating those today. The patient with Carafate and antacids, follow up with gastrology for consideration of repeat endoscopy.  Considering the patient's symptoms, medical history, and physical examination today, I have low suspicion for cholecystitis or biliary pathology, pancreatitis, perforation or bowel obstruction, hernia, intra-abdominal abscess, AAA or dissection, volvulus or intussusception, mesenteric ischemia, or appendicitis.        ____________________________________________   FINAL CLINICAL IMPRESSION(S) / ED DIAGNOSES    Final diagnoses:  Epigastric pain     ED Discharge Orders        Ordered    sucralfate (CARAFATE) 1 g tablet  4 times daily     09/03/17 1105    famotidine (PEPCID) 20 MG tablet  2 times daily     09/03/17 1105      Portions of this note were generated with dragon dictation software. Dictation errors may occur despite best attempts at proofreading.    Sharman Cheek, MD 09/03/17 1110    Sharman Cheek, MD 09/11/17 1520

## 2017-09-03 NOTE — ED Notes (Signed)
Patient ambulatory to room 16, Cassie RN aware of placement in room.

## 2017-09-03 NOTE — ED Notes (Signed)
Pt presents today from home via POV. Pt is complaining of RUQ pain that radiates around to back. Pt is grimacing, A/O

## 2017-09-03 NOTE — ED Notes (Signed)
Labs sent at this time.

## 2017-09-03 NOTE — ED Triage Notes (Signed)
Patient ambulatory to triage with steady gait, without difficulty or distress noted; pt reports upper abd pain radiating into chest and into back since birth of child 8mos ago accomp by nausea

## 2017-09-10 ENCOUNTER — Emergency Department: Payer: Medicaid Other

## 2017-09-10 ENCOUNTER — Encounter: Payer: Self-pay | Admitting: Emergency Medicine

## 2017-09-10 ENCOUNTER — Emergency Department
Admission: EM | Admit: 2017-09-10 | Discharge: 2017-09-10 | Disposition: A | Discharge status: Home / Self Care | Payer: Medicaid Other | Attending: Emergency Medicine | Admitting: Emergency Medicine

## 2017-09-10 ENCOUNTER — Other Ambulatory Visit: Payer: Self-pay

## 2017-09-10 DIAGNOSIS — Z79899 Other long term (current) drug therapy: Secondary | ICD-10-CM | POA: Insufficient documentation

## 2017-09-10 DIAGNOSIS — R1031 Right lower quadrant pain: Secondary | ICD-10-CM | POA: Insufficient documentation

## 2017-09-10 DIAGNOSIS — Z87891 Personal history of nicotine dependence: Secondary | ICD-10-CM | POA: Insufficient documentation

## 2017-09-10 DIAGNOSIS — R1033 Periumbilical pain: Secondary | ICD-10-CM | POA: Diagnosis present

## 2017-09-10 LAB — COMPREHENSIVE METABOLIC PANEL
ALT: 14 U/L (ref 14–54)
ANION GAP: 7 (ref 5–15)
AST: 15 U/L (ref 15–41)
Albumin: 3.6 g/dL (ref 3.5–5.0)
Alkaline Phosphatase: 73 U/L (ref 38–126)
BILIRUBIN TOTAL: 0.5 mg/dL (ref 0.3–1.2)
BUN: 24 mg/dL — AB (ref 6–20)
CO2: 25 mmol/L (ref 22–32)
Calcium: 8.5 mg/dL — ABNORMAL LOW (ref 8.9–10.3)
Chloride: 107 mmol/L (ref 101–111)
Creatinine, Ser: 0.71 mg/dL (ref 0.44–1.00)
GFR calc Af Amer: >60 mL/min (ref >60)
Glucose, Bld: 100 mg/dL — ABNORMAL HIGH (ref 65–99)
Potassium: 3.5 mmol/L (ref 3.5–5.1)
Sodium: 139 mmol/L (ref 135–145)
TOTAL PROTEIN: 6.4 g/dL — AB (ref 6.5–8.1)

## 2017-09-10 LAB — CBC WITH DIFFERENTIAL/PLATELET
Basophils Absolute: 0 10*3/uL (ref 0–0.1)
Basophils Relative: 0 %
Eosinophils Absolute: 1 10*3/uL — ABNORMAL HIGH (ref 0–0.7)
Eosinophils Relative: 13 %
HEMATOCRIT: 40.5 % (ref 35.0–47.0)
Hemoglobin: 14.2 g/dL (ref 12.0–16.0)
LYMPHS ABS: 1.4 10*3/uL (ref 1.0–3.6)
LYMPHS PCT: 18 %
MCH: 29.6 pg (ref 26.0–34.0)
MCHC: 35.2 g/dL (ref 32.0–36.0)
MCV: 84.1 fL (ref 80.0–100.0)
MONOS PCT: 6 %
Monocytes Absolute: 0.4 10*3/uL (ref 0.2–0.9)
NEUTROS ABS: 4.9 10*3/uL (ref 1.4–6.5)
Neutrophils Relative %: 63 %
Platelets: 176 10*3/uL (ref 150–440)
RBC: 4.81 MIL/uL (ref 3.80–5.20)
RDW: 14.5 % (ref 11.5–14.5)
WBC: 7.7 10*3/uL (ref 3.6–11.0)

## 2017-09-10 LAB — URINALYSIS, COMPLETE (UACMP) WITH MICROSCOPIC
BACTERIA UA: NONE SEEN
BILIRUBIN URINE: NEGATIVE
Glucose, UA: NEGATIVE mg/dL
Hgb urine dipstick: NEGATIVE
KETONES UR: 20 mg/dL — AB
Nitrite: NEGATIVE
PROTEIN: NEGATIVE mg/dL
SPECIFIC GRAVITY, URINE: 1.026 (ref 1.005–1.030)
pH: 5 (ref 5.0–8.0)

## 2017-09-10 LAB — CHLAMYDIA/NGC RT PCR (ARMC ONLY)
Chlamydia Tr: NOT DETECTED
N GONORRHOEAE: NOT DETECTED

## 2017-09-10 LAB — POCT PREGNANCY, URINE: PREG TEST UR: NEGATIVE

## 2017-09-10 LAB — LIPASE, BLOOD: LIPASE: 32 U/L (ref 11–51)

## 2017-09-10 MED ORDER — MORPHINE SULFATE (PF) 2 MG/ML IV SOLN
2.0000 mg | Freq: Once | INTRAVENOUS | Status: AC
  Administered 2017-09-10: 2 mg via INTRAVENOUS

## 2017-09-10 MED ORDER — IOPAMIDOL (ISOVUE-300) INJECTION 61%
30.0000 mL | Freq: Once | INTRAVENOUS | Status: AC
  Administered 2017-09-10: 30 mL via ORAL

## 2017-09-10 MED ORDER — GI COCKTAIL ~~LOC~~
30.0000 mL | Freq: Once | ORAL | Status: AC
  Administered 2017-09-10: 30 mL via ORAL

## 2017-09-10 MED ORDER — ONDANSETRON HCL 4 MG/2ML IJ SOLN
4.0000 mg | Freq: Once | INTRAMUSCULAR | Status: AC
  Administered 2017-09-10: 4 mg via INTRAVENOUS

## 2017-09-10 MED ORDER — IOPAMIDOL (ISOVUE-300) INJECTION 61%
100.0000 mL | Freq: Once | INTRAVENOUS | Status: AC | PRN
  Administered 2017-09-10: 100 mL via INTRAVENOUS

## 2017-09-10 MED ORDER — MORPHINE SULFATE (PF) 2 MG/ML IV SOLN
INTRAVENOUS | Status: AC
  Administered 2017-09-10: 2 mg via INTRAVENOUS
  Filled 2017-09-10: qty 1

## 2017-09-10 MED ORDER — TRAMADOL HCL 50 MG PO TABS: 50.0000 mg | ORAL_TABLET | Freq: Four times a day (QID) | ORAL | 0 refills | Status: DC | PRN

## 2017-09-10 MED ORDER — ONDANSETRON HCL 4 MG/2ML IJ SOLN
INTRAMUSCULAR | Status: AC
  Filled 2017-09-10: qty 2

## 2017-09-10 MED ORDER — GI COCKTAIL ~~LOC~~
ORAL | Status: AC
  Filled 2017-09-10: qty 30

## 2017-09-10 NOTE — ED Provider Notes (Signed)
[facility] Emergency Department Provider Note    First MD Initiated Contact with Patient 09/10/17 0148     (approximate)  I have reviewed the triage vital signs and the nursing notes.   HISTORY  Chief Complaint Abdominal Pain    HPI Sandy [name] is a 27 y.o. female with below list of chronic medical conditions including sent ED visit on 09/03/2017 secondary to abdominal discomfort with concern for possible ulcer return to the emergency department with periumbilical/right lower quadrant abdominal pain which patient states started on Sunday.  Patient states current pain score is 10 out of 10.  Patient denies any aggravating or alleviating factors.  Patient denies any nausea vomiting.  Past Medical History:  Diagnosis Date  . Bicornate uterus   . Complication of anesthesia    itching after C-Sections  . Family history of adverse reaction to anesthesia    uncle has a hard time waking up  . Genital HSV   . GERD (gastroesophageal reflux disease)   . Obesity affecting pregnancy    BMI>40    Patient Active Problem List   Diagnosis Date Noted  . Status post cesarean delivery 12/18/2016  . Indication for care in labor and delivery, antepartum 12/08/2016  . Placental abruption, antepartum, third trimester 11/28/2016  . High risk pregnancy in young multigravida, third trimester 11/28/2016  . Hx of preeclampsia, prior pregnancy, currently pregnant 11/28/2016  . History of cesarean section complicating pregnancy 11/28/2016  . BMI 40.0-44.9, adult (HCC) 11/21/2016  . 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 Obesity complicating pregnancy, third trimester 06/04/2016  . 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 CESAREAN SECTION  2012   for HSV outbreak  . CESAREAN SECTION N/A 11/29/2015   Procedure: CESAREAN SECTION Baby Girl 7lb.[name];  Surgeon: Vena Austria, MD;  Location: [facility];  Service:  Obstetrics;  Laterality: N/A;  . CESAREAN SECTION N/A 12/18/2016   Procedure: CESAREAN SECTION;  Surgeon: Conard Novak, MD;  Location: [facility];  Service: Obstetrics;  Laterality: N/A;  . ears Bilateral    tubes  . ESOPHAGOGASTRODUODENOSCOPY (EGD) WITH PROPOFOL N/A 05/04/2016   Procedure: ESOPHAGOGASTRODUODENOSCOPY (EGD) WITH PROPOFOL;  Surgeon: Midge Minium, MD;  Location: [facility];  Service: Endoscopy;  Laterality: N/A;  . TONSILLECTOMY      Prior to Admission medications   Medication Sig Start Date End Date Taking? Authorizing Provider  famotidine (PEPCID) 20 MG tablet Take 1 tablet (20 mg total) by mouth 2 (two) times daily. 09/03/17   Sharman Cheek, MD  norethindrone (MICRONOR,CAMILA,ERRIN) 0.35 MG tablet Take 1 tablet (0.35 mg total) by mouth daily. Start taking in 5 days. Patient not taking: Reported on 04/04/2017 12/22/16   Oswaldo Conroy, CNM  ondansetron (ZOFRAN) 4 MG tablet Take 1 tablet (4 mg total) by mouth every 8 (eight) hours as needed for nausea or vomiting. Patient not taking: Reported on 09/03/2017 04/04/17   Governor Rooks, MD  sucralfate (CARAFATE) 1 g tablet Take 1 tablet (1 g total) by mouth 4 (four) times daily. 09/03/17   Sharman Cheek, MD  traMADol (ULTRAM) 50 MG tablet Take 1 tablet (50 mg total) by mouth every 6 (six) hours as needed. Patient not taking: Reported on 09/03/2017 04/04/17   Governor Rooks, MD    Allergies Oxycodone-acetaminophen; Fluticasone; Percocet [oxycodone-acetaminophen]; and Tape  Family History  Problem Relation Age of Onset  . Diabetes Maternal Aunt   . Hyperlipidemia Maternal  Aunt     Social History Social History   Tobacco Use  . Smoking status: Former Smoker    Last attempt to quit: 04/09/2013    Years since quitting: 4.4  . Smokeless tobacco: Never Used  Substance Use Topics  . Alcohol use: Yes    Comment: 2x/month, none during pregnacy  . Drug use: No    Review of Systems Constitutional: No  fever/chills Eyes: No visual changes. ENT: No sore throat. Cardiovascular: Denies chest pain. Respiratory: Denies shortness of breath. Gastrointestinal: No abdominal pain.  No nausea, no vomiting.  No diarrhea.  No constipation. Genitourinary: Negative for dysuria. Musculoskeletal: Negative for neck pain.  Negative for back pain. Integumentary: Negative for rash. Neurological: Negative for headaches, focal weakness or numbness.  ____________________________________________   PHYSICAL EXAM:  VITAL SIGNS: ED Triage Vitals  Enc Vitals Group     BP 09/10/17 0126 124/85     Pulse Rate 09/10/17 0126 92     Resp 09/10/17 0126 17     Temp 09/10/17 0126 98.9 F (37.2 C)     Temp Source 09/10/17 0126 Oral     SpO2 09/10/17 0126 100 %     Weight 09/10/17 0134 113.4 kg (250 lb)     Height 09/10/17 0134 1.651 m (5\' 5" )     Head Circumference --      Peak Flow --      Pain Score 09/10/17 0133 10     Pain Loc --      Pain Edu? --      Excl. in GC? --     Constitutional: Alert and oriented. Well appearing and in no acute distress. Eyes: Conjunctivae are normal.  Head: Atraumatic. Mouth/Throat: Mucous membranes are moist.  Oropharynx non-erythematous. Neck: No stridor.   Cardiovascular: Normal rate, regular rhythm. Good peripheral circulation. Grossly normal heart sounds. Respiratory: Normal respiratory effort.  No retractions. Lungs CTAB. Gastrointestinal: Periumbilical/right lower quadrant tenderness to palpation. No distention.  Musculoskeletal: No lower extremity tenderness nor edema. No gross deformities of extremities. Neurologic:  Normal speech and language. No gross focal neurologic deficits are appreciated.  Skin:  Skin is warm, dry and intact. No rash noted. Psychiatric: Mood and affect are normal. Speech and behavior are normal.  ____________________________________________   LABS (all labs ordered are listed, but only abnormal results are displayed)  Labs Reviewed    CBC WITH DIFFERENTIAL/PLATELET - Abnormal; Notable for the following components:      Result Value   Eosinophils Absolute 1.0 (*)    All other components within normal limits  COMPREHENSIVE METABOLIC PANEL - Abnormal; Notable for the following components:   Glucose, Bld 100 (*)    BUN 24 (*)    Calcium 8.5 (*)    Total Protein 6.4 (*)    All other components within normal limits  URINALYSIS, COMPLETE (UACMP) WITH MICROSCOPIC - Abnormal; Notable for the following components:   Color, Urine YELLOW (*)    APPearance CLEAR (*)    Ketones, ur 20 (*)    Leukocytes, UA TRACE (*)    All other components within normal limits  CHLAMYDIA/NGC RT PCR ([facility] ONLY)  LIPASE, BLOOD  POCT PREGNANCY, URINE     RADIOLOGY I, [place], personally viewed and evaluated these images (plain radiographs) as part of my medical decision making, as well as reviewing the written report by the radiologist.  ED MD interpretation: Mildly distended right ovarian vein concern for pelvic congestion syndrome per radiologist  Official radiology report(s): Ct  Abdomen Pelvis W Contrast  Result Date: 09/10/2017 CLINICAL DATA:  Acute onset of umbilical abdominal pain radiating to the back. EXAM: CT ABDOMEN AND PELVIS WITH CONTRAST TECHNIQUE: Multidetector CT imaging of the abdomen and pelvis was performed using the standard protocol following bolus administration of intravenous contrast. CONTRAST:  ISOVUE-300 IOPAMIDOL (ISOVUE-300) INJECTION 61% COMPARISON:  CT of the abdomen and pelvis from 04/04/2017 FINDINGS: Lower chest: The visualized lung bases are grossly clear. The visualized portions of the mediastinum are unremarkable. Hepatobiliary: The liver is unremarkable in appearance. The gallbladder is unremarkable in appearance. The common bile duct remains normal in caliber. Pancreas: The pancreas is within normal limits. Spleen: The spleen is mildly enlarged, measuring 13.2 cm in length.  Adrenals/Urinary Tract: The adrenal glands are unremarkable in appearance. The kidneys are within normal limits. There is no evidence of hydronephrosis. No renal or ureteral stones are identified. No perinephric stranding is seen. Stomach/Bowel: The stomach is unremarkable in appearance. The small bowel is within normal limits. The appendix is normal in caliber, without evidence of appendicitis. The colon is unremarkable in appearance. Vascular/Lymphatic: The abdominal aorta is unremarkable in appearance. The inferior vena cava is grossly unremarkable. No retroperitoneal lymphadenopathy is seen. No pelvic sidewall lymphadenopathy is identified. Pericecal nodes are borderline prominent. Reproductive: The bladder is mildly distended and grossly unremarkable. The uterus is unremarkable in appearance. The ovaries are relatively symmetric. No suspicious adnexal masses are seen. The right ovarian vein has increased mildly in prominence from the prior study. Would correlate clinically for evidence of pelvic congestion syndrome. Other: No additional soft tissue abnormalities are seen. Musculoskeletal: No acute osseous abnormalities are identified. The visualized musculature is unremarkable in appearance. IMPRESSION: 1. The right ovarian vein has increased mildly in prominence from the prior study. Would correlate clinically for evidence of pelvic congestion syndrome. 2. Mild splenomegaly. Electronically Signed   By: Roanna Raider M.D.   On: 09/10/2017 05:08     Procedures   ____________________________________________   INITIAL IMPRESSION / ASSESSMENT AND PLAN / ED COURSE  As part of my medical decision making, I reviewed the following data within the electronic MEDICAL RECORD NUMBER    27 year old female presented with above-stated history and physical exam secondary to abdominal pain.  Given right lower quadrant location concern for possible appendicitis kidney stone versus ovarian pathology and such CT scan of  the abdomen pelvis was performed which revealed a mildly dilated right ovarian vein raising concern for possible pelvic congestion syndrome.     ____________________________________________  FINAL CLINICAL IMPRESSION(S) / ED DIAGNOSES  Final diagnoses:  Right lower quadrant abdominal pain     MEDICATIONS GIVEN DURING THIS VISIT:  Medications  iopamidol (ISOVUE-300) 61 % injection 30 mL (30 mLs Oral Contrast Given 09/10/17 0248)  morphine 2 MG/ML injection 2 mg (2 mg Intravenous Given 09/10/17 0303)  ondansetron (ZOFRAN) injection 4 mg (4 mg Intravenous Given 09/10/17 0258)  gi cocktail (Maalox,Lidocaine,Donnatal) (30 mLs Oral Given 09/10/17 0254)  iopamidol (ISOVUE-300) 61 % injection 100 mL (100 mLs Intravenous Contrast Given 09/10/17 0431)     ED Discharge Orders    None       Note:  This document was prepared using Dragon voice recognition software and may include unintentional dictation errors.    Darci Current, MD 09/10/17 [id number]

## 2017-09-10 NOTE — ED Notes (Signed)
ED Provider at bedside. 

## 2017-09-10 NOTE — ED Triage Notes (Signed)
Patient ambulatory to triage with steady gait, without difficulty or distress noted; pt reports umbilicial pain that radiates into back; st hx of same and rx carafate for ?ulcer/reflux; had increase in pain after going to a family reunion

## 2017-09-10 NOTE — ED Notes (Signed)
Reviewed discharge instructions, follow-up care, and prescriptions with patient. Patient verbalized understanding of all information reviewed. Patient stable, with no distress noted at this time.    

## 2017-09-10 NOTE — ED Notes (Signed)
RN to bedside to clean room. Patient left cell phone charger. RN attempted to call patient - patient did not answer and voicemail box has not been set up. Charger labeled and left at front desk.

## 2017-09-13 ENCOUNTER — Ambulatory Visit (INDEPENDENT_AMBULATORY_CARE_PROVIDER_SITE_OTHER): Payer: Medicaid Other | Admitting: Obstetrics and Gynecology

## 2017-09-13 ENCOUNTER — Encounter: Payer: Self-pay | Admitting: Obstetrics and Gynecology

## 2017-09-13 VITALS — BP 120/80 | HR 60 | Ht 65.0 in | Wt 248.0 lb

## 2017-09-13 DIAGNOSIS — N9489 Other specified conditions associated with female genital organs and menstrual cycle: Secondary | ICD-10-CM

## 2017-09-13 DIAGNOSIS — R102 Pelvic and perineal pain: Secondary | ICD-10-CM | POA: Diagnosis not present

## 2017-09-13 NOTE — Progress Notes (Signed)
Obstetrics & Gynecology Office Visit   Chief Complaint:  Chief Complaint  Patient presents with  . Follow-up    ER follow up/ abdominal pain     History of Present Illness: 27 y.o. [id number] presenting for discussion of additional management option. 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  She is currently being managed with NSAIDs.   The patient reports acute worsening since in symptoms.  On her current medication regimen the patient has achieved amenorrhea.   She has not noted any side-effects or new symptoms.  She has previously tried NSAIDs and narcotics.  No evidence of endometriosis at the time of her last C-section 11/29/2015.  She is also currently on nexplanon which should provide good treatment for endometriosis and she has successfully achieved amenorrhea on this.  Pain is right sided throbbing and cramping.  It is dependent, worse when standing or pushing/lifting.     Review of Systems: Review of Systems  Constitutional: Negative.   Gastrointestinal: Positive for abdominal pain. Negative for constipation, diarrhea, nausea and vomiting.     Past Medical History:  Past Medical History:  Diagnosis Date  . Bicornate uterus   . Complication of anesthesia    itching after C-Sections  . Family history of adverse reaction to anesthesia    uncle has a hard time waking up  . Genital HSV   . GERD (gastroesophageal reflux disease)   . Obesity affecting pregnancy    BMI>40    Past Surgical History:  Past Surgical History:  Procedure Laterality Date  . CESAREAN SECTION  2012   for HSV outbreak  . CESAREAN SECTION N/A 11/29/2015   Procedure: CESAREAN SECTION Baby Girl 7lb.Jerrilyn Cairo, 6oz.;  Surgeon: Vena AustriaAndreas Axyl Sitzman, MD;  Location: [facility];  Service: Obstetrics;  Laterality: N/A;  . CESAREAN SECTION N/A 12/18/2016   Procedure: CESAREAN SECTION;  Surgeon: Conard NovakJackson, Stephen D, MD;  Location: [facility];  Service:  Obstetrics;  Laterality: N/A;  . ears Bilateral    tubes  . ESOPHAGOGASTRODUODENOSCOPY (EGD) WITH PROPOFOL N/A 05/04/2016   Procedure: ESOPHAGOGASTRODUODENOSCOPY (EGD) WITH PROPOFOL;  Surgeon: Midge Miniumarren Wohl, MD;  [facility];  Service: Endoscopy;  Laterality: N/A;  . TONSILLECTOMY      Gynecologic History: No LMP recorded. Patient has had an implant.  Obstetric History: [id number]  Family History:  Family History  Problem Relation Age of Onset  . Diabetes Maternal Aunt   . Hyperlipidemia Maternal Aunt     Social History:  Social History   Socioeconomic History  . Marital status: Married    Spouse name: Not on file  . Number of children: Not on file  . Years of education: Not on file  . Highest education level: Not on file  Occupational History  . Not on file  Social Needs  . Financial resource strain: Not on file  . Food insecurity:    Worry: Not on file    Inability: Not on file  . Transportation needs:    Medical: Not on file    Non-medical: Not on file  Tobacco Use  . Smoking status: Former Smoker    Last attempt to quit: 04/09/2013    Years since quitting: 4.4  . Smokeless tobacco: Never Used  Substance and Sexual Activity  . Alcohol use: Yes    Comment: 2x/month, none during pregnacy  . Drug use: No  . Sexual activity: Yes    Birth control/protection: Implant  Lifestyle  . Physical activity:    Days per week: Not on file    Minutes per session: Not on file  . Stress: Not on file  Relationships  . Social connections:    Talks on phone: Not on file    Gets together: Not on file    Attends religious service: Not on file    Active member of club or organization: Not on file    Attends meetings of clubs or organizations: Not on file    Relationship status: Not on file  . Intimate partner violence:    Fear of current or ex partner: Not on file    Emotionally abused: Not on file    Physically abused: Not on file    Forced sexual activity: Not  on file  Other Topics Concern  . Not on file  Social History Narrative  . Not on file    Allergies:  Allergies  Allergen Reactions  . Oxycodone-Acetaminophen Itching  . Fluticasone Swelling    Mouth swelling  . Percocet [Oxycodone-Acetaminophen] Itching  . Tape     Some tapes cause blisters    Medications: Prior to Admission medications   Medication Sig Start Date End Date Taking? Authorizing Provider  famotidine (PEPCID) 20 MG tablet Take 1 tablet (20 mg total) by mouth 2 (two) times daily. 09/03/17  Yes Sharman Cheek, MD  sucralfate (CARAFATE) 1 g tablet Take 1 tablet (1 g total) by mouth 4 (four) times daily. 09/03/17  Yes Sharman Cheek, MD  traMADol (ULTRAM) 50 MG tablet Take 1 tablet (50 mg total) by mouth every 6 (six) hours as needed. 09/10/17 09/10/18 Yes Darci Current, MD    Physical Exam Vitals:  Vitals:   09/13/17 1054  BP: 120/80  Pulse: 60   No LMP recorded. Patient has had an implant.  General: NAD HEENT: normocephalic, anicteric Pulmonary: No increased work of breathing Extremities: no edema, erythema, or tenderness Neurologic: Grossly intact Psychiatric: mood appropriate, affect full  Female chaperone present for pelvic  portions of the physical exam  Ct Abdomen Pelvis W Contrast  Result Date: 09/10/2017 CLINICAL DATA:  Acute onset of umbilical abdominal pain radiating to the back. EXAM: CT ABDOMEN AND PELVIS WITH CONTRAST TECHNIQUE: Multidetector CT imaging of the abdomen and pelvis was performed using the standard protocol following bolus administration of intravenous contrast. CONTRAST:  ISOVUE-300 IOPAMIDOL (ISOVUE-300) INJECTION 61% COMPARISON:  CT of the abdomen and pelvis from 04/04/2017 FINDINGS: Lower chest: The visualized lung bases are grossly clear. The visualized portions of the mediastinum are unremarkable. Hepatobiliary: The liver is unremarkable in appearance. The gallbladder is unremarkable in appearance. The common bile duct  remains normal in caliber. Pancreas: The pancreas is within normal limits. Spleen: The spleen is mildly enlarged, measuring 13.2 cm in length. Adrenals/Urinary Tract: The adrenal glands are unremarkable in appearance. The kidneys are within normal limits. There is no evidence of hydronephrosis. No renal or ureteral stones are identified. No perinephric stranding is seen. Stomach/Bowel: The stomach is unremarkable in appearance. The small bowel is within normal limits. The appendix is normal in caliber, without evidence of appendicitis. The colon is unremarkable in appearance. Vascular/Lymphatic: The abdominal aorta is unremarkable in appearance. The inferior vena cava is grossly unremarkable. No retroperitoneal lymphadenopathy is seen. No pelvic sidewall lymphadenopathy is identified. Pericecal nodes are borderline prominent. Reproductive: The bladder is mildly distended and grossly unremarkable. The uterus is unremarkable in appearance. The ovaries are relatively symmetric. No suspicious adnexal masses are seen. The  right ovarian vein has increased mildly in prominence from the prior study. Would correlate clinically for evidence of pelvic congestion syndrome. Other: No additional soft tissue abnormalities are seen. Musculoskeletal: No acute osseous abnormalities are identified. The visualized musculature is unremarkable in appearance. IMPRESSION: 1. The right ovarian vein has increased mildly in prominence from the prior study. Would correlate clinically for evidence of pelvic congestion syndrome. 2. Mild splenomegaly. Electronically Signed   By: Roanna Raider M.D.   On: 09/10/2017 05:08     Assessment: 27 y.o. [id number] with known bicornuate uterus and and right pelvic pain with evidence of right sided pelvic congestion on CT  Plan: Problem List Items Addressed This Visit    None    Visit Diagnoses    Pelvic congestion syndrome    -  Primary   Relevant Orders   Ambulatory referral to Vascular Surgery     Pelvic pain       Relevant Orders   Ambulatory referral to Vascular Surgery      1) Pelvic congestion - right ovarian vein with increased in congestion in comparison to prior imaging, ipsilateral to patient's symptoms.   We discussed the difficulty in diagnosis pelvic congestion syndrome although her imaging appears relatively definitive.  Referral to [facility].  No menstrual concerns secondary to nexplanon  2) A total of 15 minutes were spent in face-to-face contact with the patient during this encounter with over half of that time devoted to counseling and coordination of care.  3) Return if symptoms worsen or fail to improve.   Vena Austria, MD, [facility], [facility]

## 2017-09-16 ENCOUNTER — Telehealth: Payer: Self-pay

## 2017-09-16 NOTE — Telephone Encounter (Signed)
Pt f/u from apt on 09/13/17. She is waiting on a call about her referral. She wants to get out of pain as soon as possible and to know what to expect. Cb#[phone]

## 2017-09-17 NOTE — Telephone Encounter (Signed)
Should be in!

## 2017-10-26 IMAGING — US US ABDOMEN LIMITED
1 series · 14 of 25 positions shown · non-contrast
Comparison: None.

CLINICAL DATA: Right upper quadrant pain. Epigastric pain shooting
across the right upper quadrant into the back for 1 week.
Indigestion and nausea.

EXAM:
US ABDOMEN LIMITED - RIGHT UPPER QUADRANT

[Series 1: us abdomen limited · 0.18mm/px · 14 of 48 slices shown]
[im 1/48]
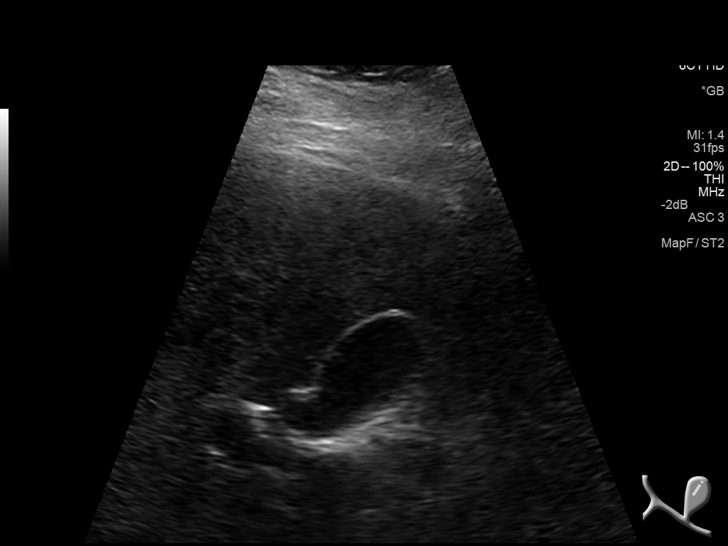
[im 4/48]
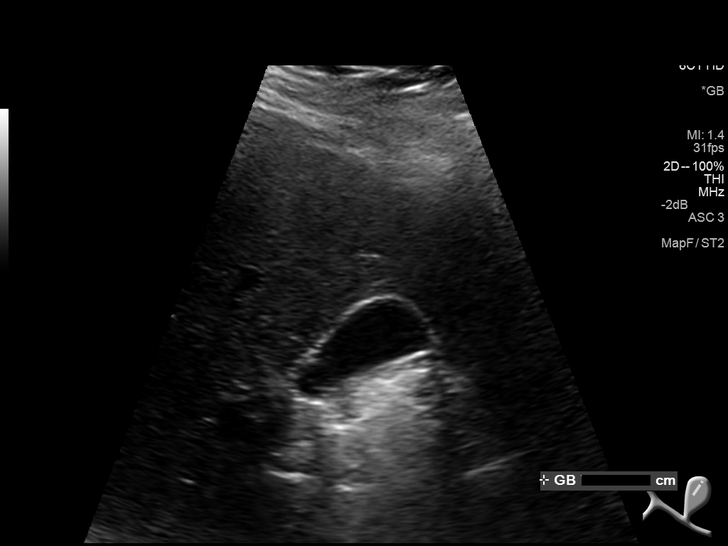
[im 8/48]
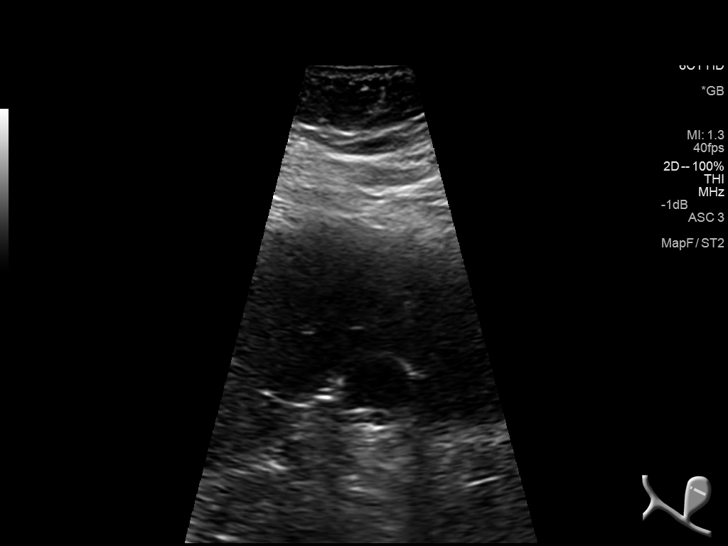
[im 12/48]
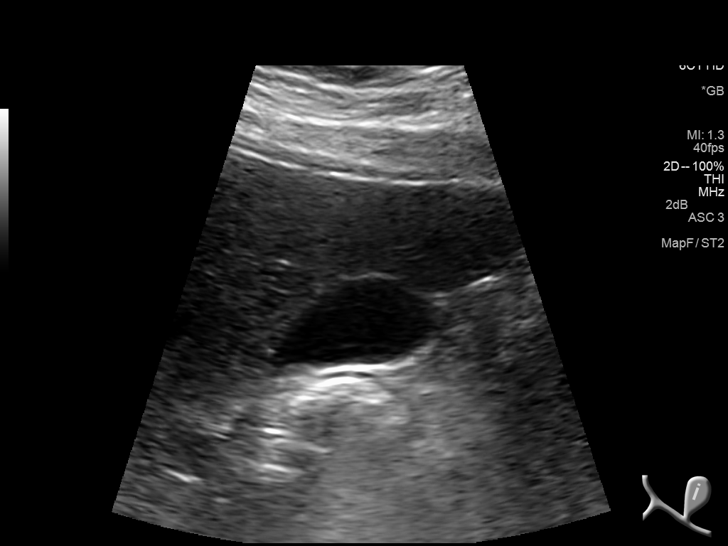
[im 16/48]
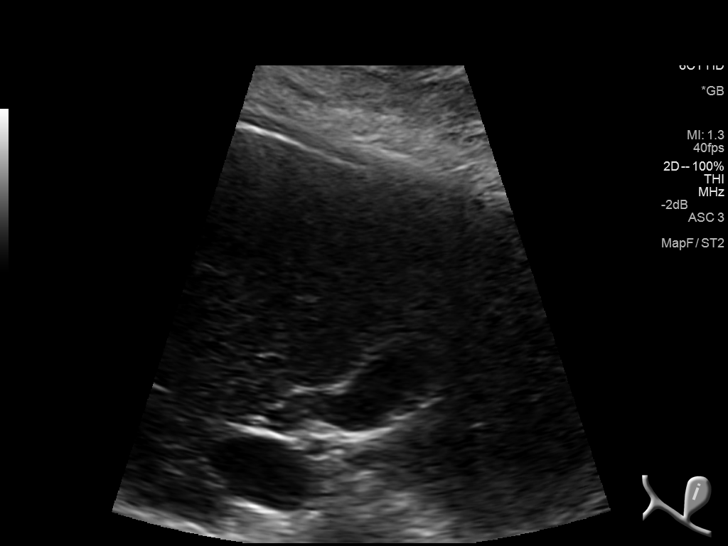
[im 18/48]
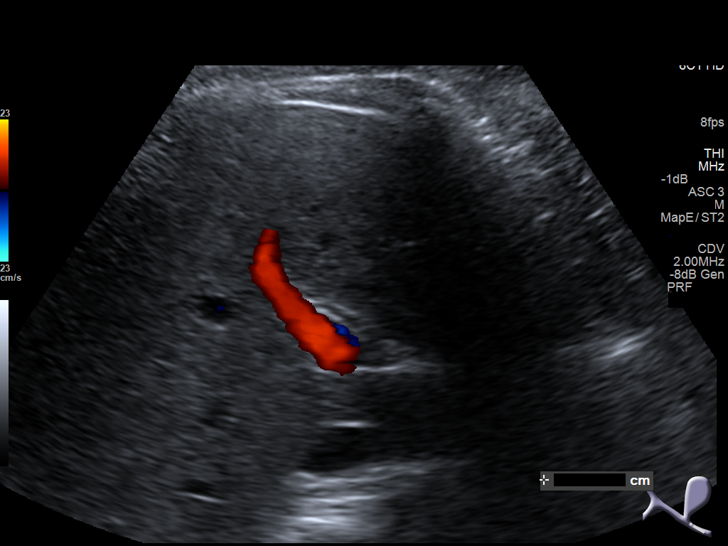
[im 22/48]
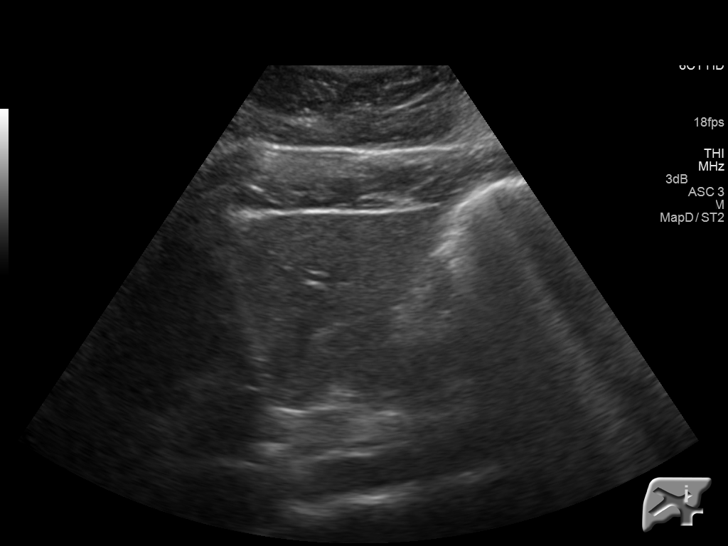
[im 26/48]
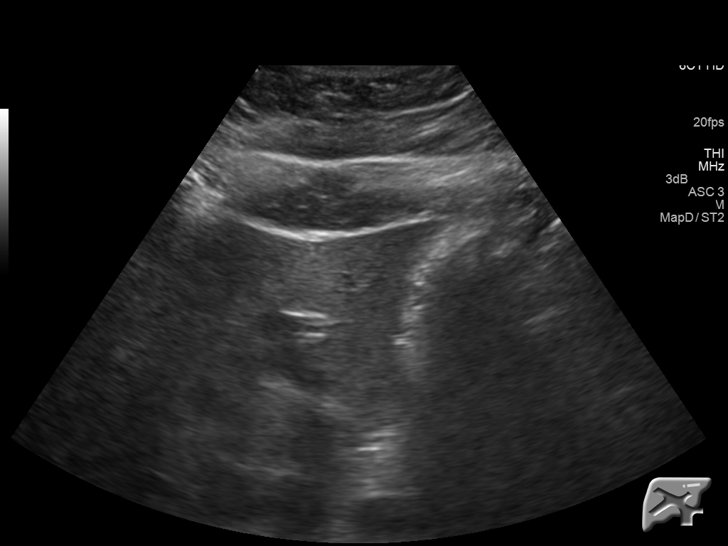
[im 30/48]
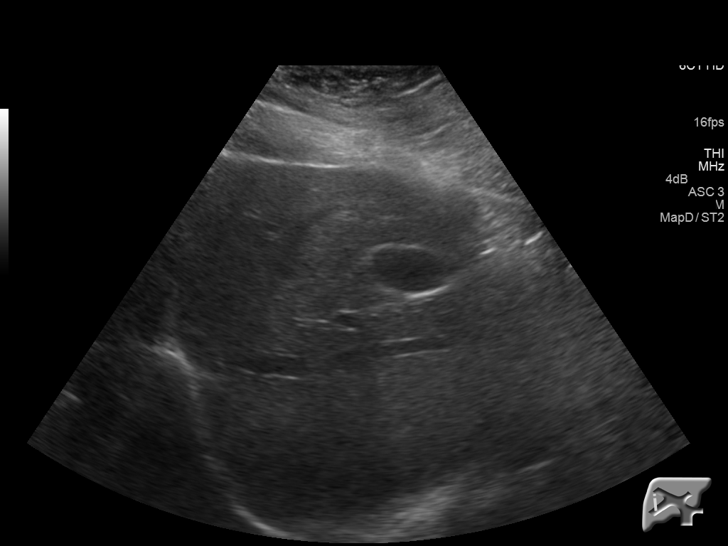
[im 32/48]
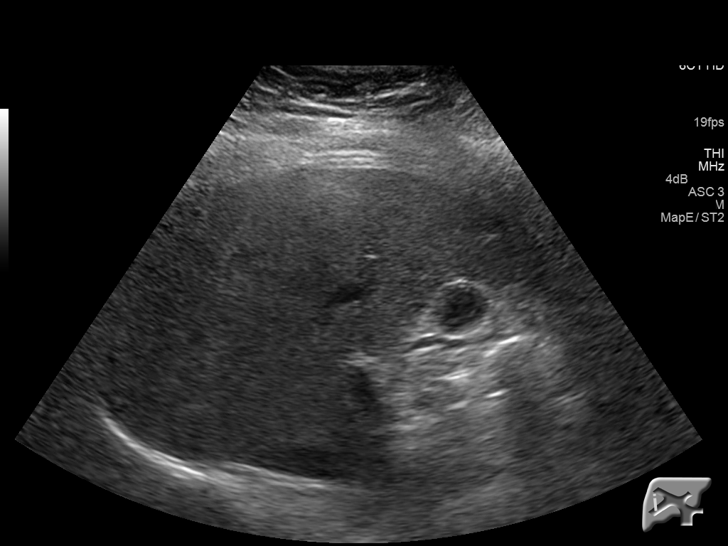
[im 36/48]
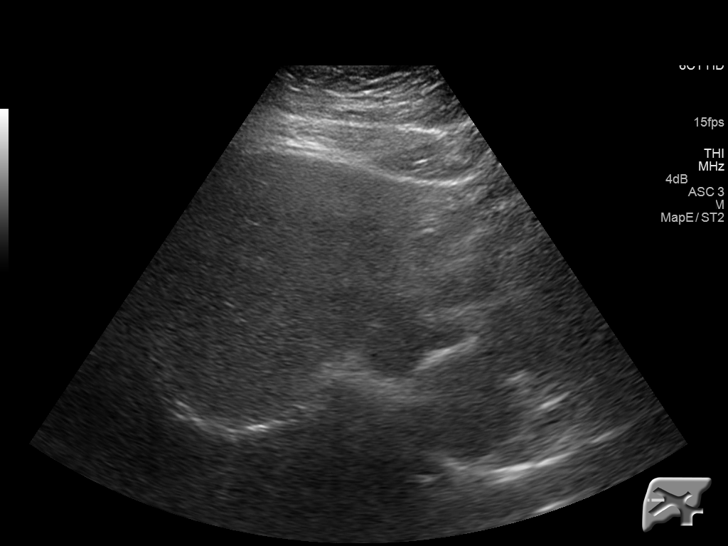
[im 40/48]
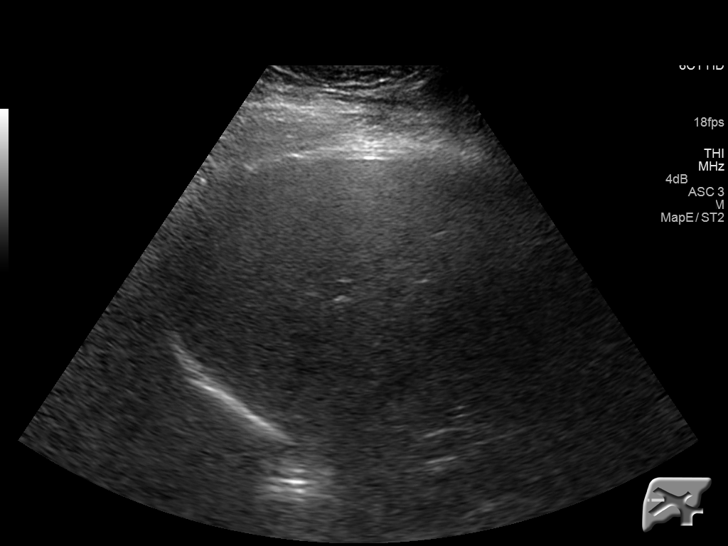
[im 44/48]
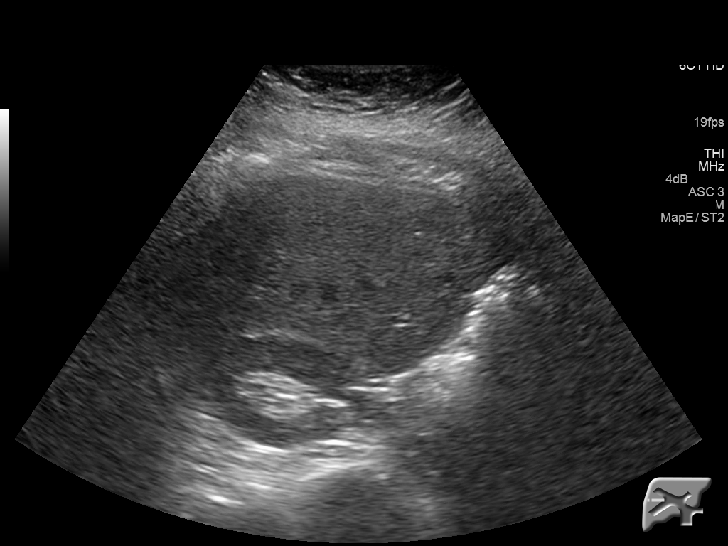
[im 48/48]
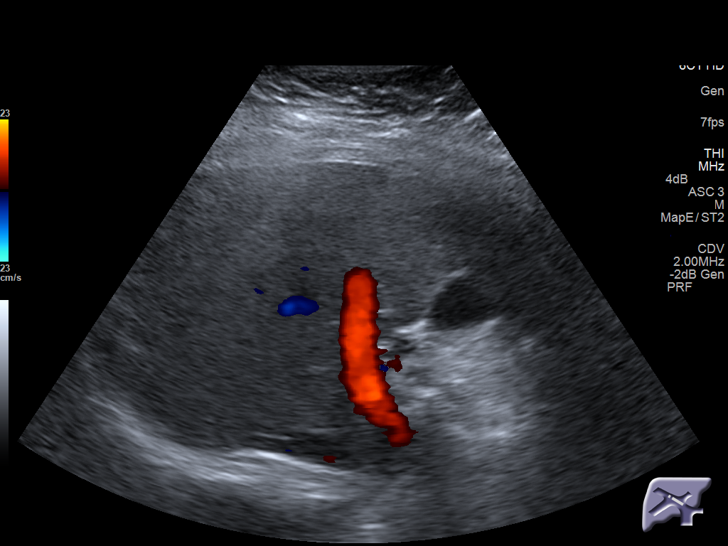

[14 of 25 positions shown; findings below may reference images not displayed]

FINDINGS: Gallbladder:

No gallstones or wall thickening visualized. No sonographic Murphy
sign noted.

Common bile duct:

Diameter: 2 mm, normal

Liver:

Mildly increased and coarsened parenchymal echotexture likely due to
fatty infiltration. No focal liver lesions identified.
IMPRESSION: No evidence of cholelithiasis or cholecystitis. Probable fatty
infiltration in the liver.

## 2017-10-29 ENCOUNTER — Encounter: Payer: Self-pay | Admitting: Emergency Medicine

## 2017-10-29 ENCOUNTER — Other Ambulatory Visit: Payer: Self-pay

## 2017-10-29 ENCOUNTER — Emergency Department
Admission: EM | Admit: 2017-10-29 | Discharge: 2017-10-29 | Disposition: A | Discharge status: Home / Self Care | Payer: Medicaid Other | Attending: Emergency Medicine | Admitting: Emergency Medicine

## 2017-10-29 ENCOUNTER — Emergency Department: Payer: Medicaid Other

## 2017-10-29 DIAGNOSIS — Z87891 Personal history of nicotine dependence: Secondary | ICD-10-CM | POA: Insufficient documentation

## 2017-10-29 DIAGNOSIS — R55 Syncope and collapse: Secondary | ICD-10-CM

## 2017-10-29 DIAGNOSIS — Z79899 Other long term (current) drug therapy: Secondary | ICD-10-CM | POA: Insufficient documentation

## 2017-10-29 DIAGNOSIS — R110 Nausea: Secondary | ICD-10-CM | POA: Insufficient documentation

## 2017-10-29 DIAGNOSIS — R1032 Left lower quadrant pain: Secondary | ICD-10-CM

## 2017-10-29 DIAGNOSIS — R42 Dizziness and giddiness: Secondary | ICD-10-CM | POA: Diagnosis not present

## 2017-10-29 LAB — URINALYSIS, COMPLETE (UACMP) WITH MICROSCOPIC
Bilirubin Urine: NEGATIVE
GLUCOSE, UA: NEGATIVE mg/dL
Hgb urine dipstick: NEGATIVE
Ketones, ur: NEGATIVE mg/dL
Leukocytes, UA: NEGATIVE
Nitrite: NEGATIVE
Protein, ur: NEGATIVE mg/dL
Specific Gravity, Urine: 1.018 (ref 1.005–1.030)
pH: 5 (ref 5.0–8.0)

## 2017-10-29 LAB — CBC
HEMATOCRIT: 42.1 % (ref 35.0–47.0)
HEMOGLOBIN: 14.7 g/dL (ref 12.0–16.0)
MCH: 29.9 pg (ref 26.0–34.0)
MCHC: 34.9 g/dL (ref 32.0–36.0)
MCV: 85.6 fL (ref 80.0–100.0)
Platelets: 176 10*3/uL (ref 150–440)
RBC: 4.92 MIL/uL (ref 3.80–5.20)
RDW: 14.3 % (ref 11.5–14.5)
WBC: 6.5 10*3/uL (ref 3.6–11.0)

## 2017-10-29 LAB — COMPREHENSIVE METABOLIC PANEL
ALBUMIN: 4 g/dL (ref 3.5–5.0)
ALT: 10 U/L (ref 0–44)
ANION GAP: 5 (ref 5–15)
AST: 12 U/L — ABNORMAL LOW (ref 15–41)
Alkaline Phosphatase: 75 U/L (ref 38–126)
BUN: 16 mg/dL (ref 6–20)
CHLORIDE: 107 mmol/L (ref 98–111)
CO2: 26 mmol/L (ref 22–32)
Calcium: 8.9 mg/dL (ref 8.9–10.3)
Creatinine, Ser: 0.72 mg/dL (ref 0.44–1.00)
GFR calc non Af Amer: >60 mL/min (ref >60)
GLUCOSE: 88 mg/dL (ref 70–99)
POTASSIUM: 4.3 mmol/L (ref 3.5–5.1)
Sodium: 138 mmol/L (ref 135–145)
Total Bilirubin: 0.7 mg/dL (ref 0.3–1.2)
Total Protein: 6.9 g/dL (ref 6.5–8.1)

## 2017-10-29 LAB — LIPASE, BLOOD: LIPASE: 27 U/L (ref 11–51)

## 2017-10-29 LAB — POCT PREGNANCY, URINE: Preg Test, Ur: NEGATIVE

## 2017-10-29 MED ORDER — FENTANYL CITRATE (PF) 100 MCG/2ML IJ SOLN
50.0000 ug | Freq: Once | INTRAMUSCULAR | Status: AC
  Administered 2017-10-29: 50 ug via INTRAVENOUS
  Filled 2017-10-29: qty 2

## 2017-10-29 MED ORDER — IOPAMIDOL (ISOVUE-370) INJECTION 76%
100.0000 mL | Freq: Once | INTRAVENOUS | Status: AC | PRN
  Administered 2017-10-29: 100 mL via INTRAVENOUS

## 2017-10-29 MED ORDER — TRAMADOL HCL 50 MG PO TABS: 50.0000 mg | ORAL_TABLET | Freq: Four times a day (QID) | ORAL | 0 refills | Status: DC | PRN

## 2017-10-29 MED ORDER — SODIUM CHLORIDE 0.9 % IV BOLUS
1000.0000 mL | Freq: Once | INTRAVENOUS | Status: AC
  Administered 2017-10-29: 1000 mL via INTRAVENOUS

## 2017-10-29 MED ORDER — ONDANSETRON 4 MG PO TBDP: 4.0000 mg | ORAL_TABLET | Freq: Three times a day (TID) | ORAL | 0 refills | Status: DC | PRN

## 2017-10-29 MED ORDER — ONDANSETRON HCL 4 MG/2ML IJ SOLN
4.0000 mg | Freq: Once | INTRAMUSCULAR | Status: AC
  Administered 2017-10-29: 4 mg via INTRAVENOUS
  Filled 2017-10-29: qty 2

## 2017-10-29 NOTE — ED Triage Notes (Addendum)
Patient had a venous [facility] [place].  Placed in an abdominal vein between "kidney, heart, and legs".  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  Arrives today with c/o dizziness and near syncope.  States was placed on Elaquis 5mg  BID and 81 mg ASA daily.

## 2017-10-29 NOTE — Discharge Instructions (Addendum)
You have been seen in the Emergency Department (ED) for abdominal pain.  Your evaluation did not identify a clear cause of your symptoms but was generally reassuring.  Abdominal pain has many possible causes. Some aren't serious and get better on their own in a few days. Others need more testing and treatment. If your pain continues or gets worse, you need to be rechecked and may need more tests to find out what is wrong. You may need surgery to correct the problem.   Follow up with your doctor in 12-24 hours if you are still having abdominal pain. Otherwise follow up in 1-3 days for a re-check  For pain take 2 extra strength tylenol every 8 hours and 1 tramadol every 4-6 hours as needed for breakthrough pain.  Don't ignore new symptoms, such as fever, nausea and vomiting, new or worsening abdominal pain, urination problems, bloody diarrhea or bloody stools, black tarry stools, uncontrollable nausea and vomiting, and dizziness. These may be signs of a more serious problem. If you develop any of these you should be seen by your doctor immediately or return to the ED.   How can you care for yourself at home?  Rest until you feel better.  To prevent dehydration, drink plenty of fluids, enough so that your urine is light yellow or clear like water. Choose water and other caffeine-free clear liquids until you feel better. If you have kidney, heart, or liver disease and have to limit fluids, talk with your doctor before you increase the amount of fluids you drink.  If your stomach is upset, eat mild foods, such as rice, dry toast or crackers, bananas, and applesauce. Try eating several small meals instead of two or three large ones.  Wait until 48 hours after all symptoms have gone away before you have spicy foods, alcohol, and drinks that contain caffeine.  Do not eat foods that are high in fat.  Avoid anti-inflammatory medicines such as aspirin, ibuprofen (Advil, Motrin), and naproxen (Aleve). These can  cause stomach upset. Talk to your doctor if you take daily aspirin for another health problem.  When should you call for help?  Call 911 anytime you think you may need emergency care. For example, call if:  You passed out (lost consciousness).  You pass maroon or very bloody stools.  You vomit blood or what looks like coffee grounds.  You have new, severe belly pain.  Call your doctor now or seek immediate medical care if:  Your pain gets worse, especially if it becomes focused in one area of your belly.  You have a new or higher fever.  Your stools are black and look like tar, or they have streaks of blood.  You have unexpected vaginal bleeding.  You have symptoms of a urinary tract infection. These may include:  Pain when you urinate.  Urinating more often than usual.  Blood in your urine. You are dizzy or lightheaded, or you feel like you may faint. Watch closely for changes in your health, and be sure to contact your doctor if:  You are not getting better after 1 day (24 hours).

## 2017-10-29 NOTE — ED Notes (Signed)
Pt states that after stent was placed she has had increased weakness and dizziness for the last couple of days. Pt states she was told to come to the ED to have "tests done to figure out whats wrong with me."

## 2017-10-29 NOTE — ED Provider Notes (Signed)
[facility] Emergency Department Provider Note  ____________________________________________  Time seen: Approximately 4:07 PM  I have reviewed the triage vital signs and the nursing notes.   HISTORY  Chief Complaint No chief complaint on file.   HPI GLADIS SOLEY is a 27 y.o. female POD 4 from stent placement in abdominal vasculature who presents for evaluation of dizziness and syncopal event.  Patient was sent to [facility] vascular in carried by her OB/GYN for concerns of pelvic congestion syndrome.  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  She is not sure which location the stent was placed.  She was sent home on Eliquis and a baby aspirin.  She reports that since the surgery she has had unchanged pain which is stabbing located in the left flank radiating to the left lower quadrant, 8 out of 10, constant.  This is the same pain she had before the procedure.  Since the procedure she also has had nausea.  Yesterday she had a syncopal episode while standing in the kitchen.  She did not sustain any injuries.  She reports that since that syncopal event she has been having several episodes of dizziness./Lightheadedness.  She reports feeling dizzy before passing out.  No chest pain, no palpitations,  dysuria or hematuria, no vomiting or diarrhea.  Patient does report a fever of 101F on the day of the surgery but has not had a fever since then.  She contacted the vascular surgeon who told her to follow-up with her primary care doctor.  After speaking with her primary care doctor today she was told her that they could not do anything for her and she had a had to come to the ER or go back to the surgeon.  Past Medical History:  Diagnosis Date  . Bicornate uterus   . Complication of anesthesia    itching after C-Sections  . Family history of adverse reaction to  anesthesia    uncle has a hard time waking up  . Genital HSV   . GERD (gastroesophageal reflux disease)   . Obesity affecting pregnancy    BMI>40    Patient Active Problem List   Diagnosis Date Noted  . Status post cesarean delivery 12/18/2016  . Indication for care in labor and delivery, antepartum 12/08/2016  . Placental abruption, antepartum, third trimester 11/28/2016  . High risk pregnancy in young multigravida, third trimester 11/28/2016  . Hx of preeclampsia, prior pregnancy, currently pregnant 11/28/2016  . History of cesarean section complicating pregnancy 11/28/2016  . BMI 40.0-44.9, adult (HCC) 11/21/2016  . 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 Obesity complicating pregnancy, third trimester 06/04/2016  . 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 CESAREAN SECTION  2012   for HSV outbreak  . CESAREAN SECTION N/A 11/29/2015   Procedure: CESAREAN SECTION Baby Girl 7lb.[name];  Surgeon: Vena Austria, MD;  Location: [facility] ORS;  Service: Obstetrics;  Laterality: N/A;  . CESAREAN SECTION N/A 12/18/2016   Procedure: CESAREAN SECTION;  Surgeon: Conard Novak, MD;  Location: [facility] ORS;  Service: Obstetrics;  Laterality: N/A;  . ears Bilateral    tubes  . ESOPHAGOGASTRODUODENOSCOPY (EGD) WITH PROPOFOL N/A 05/04/2016   Procedure: ESOPHAGOGASTRODUODENOSCOPY (EGD) WITH PROPOFOL;  Surgeon: Midge Minium, MD;  Location: [facility];  Service: Endoscopy;  Laterality: N/A;  . TONSILLECTOMY  Prior to Admission medications   Medication Sig Start Date End Date Taking? Authorizing Provider  famotidine (PEPCID) 20 MG tablet Take 1 tablet (20 mg total) by mouth 2 (two) times daily. 09/03/17   Sharman Cheek, MD  ondansetron (ZOFRAN ODT) 4 MG disintegrating tablet Take 1 tablet (4 mg total) by mouth every 8 (eight) hours as needed for nausea or vomiting. 10/29/17   Don Perking, [place], MD  sucralfate (CARAFATE) 1 g  tablet Take 1 tablet (1 g total) by mouth 4 (four) times daily. 09/03/17   Sharman Cheek, MD  traMADol (ULTRAM) 50 MG tablet Take 1 tablet (50 mg total) by mouth every 6 (six) hours as needed. 10/29/17 10/29/18  Nita Sickle, MD    Allergies Oxycodone-acetaminophen; Fluticasone; Percocet [oxycodone-acetaminophen]; and Tape  Family History  Problem Relation Age of Onset  . Diabetes Maternal Aunt   . Hyperlipidemia Maternal Aunt     Social History Social History   Tobacco Use  . Smoking status: Former Smoker    Last attempt to quit: 04/09/2013    Years since quitting: 4.5  . Smokeless tobacco: Never Used  Substance Use Topics  . Alcohol use: Yes    Comment: 2x/month, none during pregnacy  . Drug use: No    Review of Systems  Constitutional: Negative for fever. + syncope Eyes: Negative for visual changes. ENT: Negative for sore throat. Neck: No neck pain  Cardiovascular: Negative for chest pain. Respiratory: Negative for shortness of breath. Gastrointestinal: + Left sided abdominal pain and nausea. No vomiting or diarrhea. Genitourinary: Negative for dysuria. Musculoskeletal: Negative for back pain. Skin: Negative for rash. Neurological: Negative for headaches, weakness or numbness. Psych: No SI or HI  ____________________________________________   PHYSICAL EXAM:  VITAL SIGNS: ED Triage Vitals  Enc Vitals Group     BP 10/29/17 1424 (!) 137/98     Pulse Rate 10/29/17 1424 79     Resp 10/29/17 1424 16     Temp 10/29/17 1424 97.8 F (36.6 C)     Temp Source 10/29/17 1424 Oral     SpO2 10/29/17 1424 100 %     Weight 10/29/17 1425 240 lb (108.9 kg)     Height 10/29/17 1425 5\' 5"  (1.651 m)     Head Circumference --      Peak Flow --      Pain Score 10/29/17 1425 9     Pain Loc --      Pain Edu? --      Excl. in GC? --     Constitutional: Alert and oriented. Well appearing and in no apparent distress. HEENT:      Head: Normocephalic and atraumatic.          Eyes: Conjunctivae are normal. Sclera is non-icteric.       Mouth/Throat: Mucous membranes are moist.       Neck: Supple with no signs of meningismus. Cardiovascular: Regular rate and rhythm. No murmurs, gallops, or rubs. 2+ symmetrical distal pulses are present in all extremities. No JVD. Respiratory: Normal respiratory effort. Lungs are clear to auscultation bilaterally. No wheezes, crackles, or rhonchi.  Gastrointestinal: Soft, LLQ ttp, and non distended with positive bowel sounds. No rebound or guarding. Genitourinary: L CVA tenderness. Musculoskeletal: Nontender with normal range of motion in all extremities. No edema, cyanosis, or erythema of extremities. Neurologic: Normal speech and language. Face is symmetric. Moving all extremities. No gross focal neurologic deficits are appreciated. Skin: Skin is warm, dry and intact. No rash noted. Left femoral incision  site is well healing with no evidence of infection Psychiatric: Mood and affect are normal. Speech and behavior are normal.  ____________________________________________   LABS (all labs ordered are listed, but only abnormal results are displayed)  Labs Reviewed  COMPREHENSIVE METABOLIC PANEL - Abnormal; Notable for the following components:      Result Value   AST 12 (*)    All other components within normal limits  URINALYSIS, COMPLETE (UACMP) WITH MICROSCOPIC - Abnormal; Notable for the following components:   Color, Urine YELLOW (*)    APPearance CLEAR (*)    Bacteria, UA RARE (*)    All other components within normal limits  LIPASE, BLOOD  CBC  POC URINE PREG, ED  POCT PREGNANCY, URINE   ____________________________________________  EKG  ED ECG REPORT I, Nita Sickle, the attending physician, personally viewed and interpreted this ECG.  Normal sinus rhythm, rate of 78, normal intervals, normal axis, T wave inversion in lead III, no ST elevations or depressions.  Unchanged from  prior. ____________________________________________  RADIOLOGY  I have personally reviewed the images performed during this visit and I agree with the Radiologist's read.   Interpretation by Radiologist:  Ct Angio Abd/pel W And/or Wo Contrast  Result Date: 10/29/2017 CLINICAL DATA:  27 year old with recent venous stent placement. Presents with generalized abdominal pain. EXAM: CT ANGIOGRAPHY ABDOMEN AND PELVIS WITH CONTRAST AND WITHOUT CONTRAST TECHNIQUE: Multidetector CT imaging of the abdomen and pelvis was performed using the standard protocol during bolus administration of intravenous contrast. Multiplanar reconstructed images and MIPs were obtained and reviewed to evaluate the vascular anatomy. CONTRAST:  ISOVUE-370 IOPAMIDOL (ISOVUE-370) INJECTION 76% COMPARISON:  CT 09/10/2017 FINDINGS: VASCULAR Aorta: Normal caliber aorta without aneurysm, dissection, vasculitis or significant stenosis. Celiac: Patent without evidence of aneurysm, dissection, vasculitis or significant stenosis. SMA: Patent without evidence of aneurysm, dissection, vasculitis or significant stenosis. Renals: Both renal arteries are patent without evidence of aneurysm, dissection, vasculitis, fibromuscular dysplasia or significant stenosis. IMA: Patent without evidence of aneurysm, dissection, vasculitis or significant stenosis. Inflow: Patent without evidence of aneurysm, dissection, vasculitis or significant stenosis. Proximal Outflow: Bilateral common femoral and visualized portions of the superficial and profunda femoral arteries are patent without evidence of aneurysm, dissection, vasculitis or significant stenosis. Veins: A metallic stent has been placed in the left common iliac vein that extends into the lower IVC. The IVC and iliac veins appear to be patent on the delayed images. Renal veins are patent. The lower pelvic veins are not well visualized on this examination but no significant enlargement of the lower pelvic  veins or upper thigh veins. Review of the MIP images confirms the above findings. NON-VASCULAR Lower chest: Lung bases are essentially clear. Again noted is a poorly defined nodular density in the left lower lobe roughly measuring 4 mm. This has minimally changed since 04/04/2017 and suspect this is incidental and postinflammatory in a patient of this age. Hepatobiliary: Limited evaluation on the arterial phase of imaging. No gross abnormality to the liver or gallbladder. Pancreas: Unremarkable. No pancreatic ductal dilatation or surrounding inflammatory changes. Spleen: Normal in size without focal abnormality. Adrenals/Urinary Tract: Normal adrenal glands. Normal kidneys. No hydronephrosis. Urinary bladder is unremarkable. Stomach/Bowel: Stomach is within normal limits. Appendix appears normal. No evidence of bowel wall thickening, distention, or inflammatory changes. Lymphatic: No lymph node enlargement. Reproductive: Uterus and bilateral adnexa are unremarkable. Other: No free fluid.  Negative for free air. Musculoskeletal: No acute abnormality. IMPRESSION: VASCULAR Interval placement of a vascular stent in the left  common iliac vein that extends into the IVC. IVC and stent are patent. NON-VASCULAR No acute abnormality in the abdomen and pelvis. Electronically Signed   By: Richarda Overlie M.D.   On: 10/29/2017 17:23      ____________________________________________   PROCEDURES  Procedure(s) performed: None Procedures Critical Care performed:  None ____________________________________________   INITIAL IMPRESSION / ASSESSMENT AND PLAN / ED COURSE   27 y.o. female POD 4 from stent placement in abdominal vasculature who presents for evaluation of abdominal pain, dizziness, and syncopal event.  Patient is hemodynamically stable, EKG with no evidence of ischemia or dysrhythmias.  No injury sustained from her syncopal event.  Abdomen is soft with tenderness in the left lower quadrant and flank pain.  At  this time will pursue CT angiogram to evaluate for location of the stent and patency, and also to evaluate for any intra-abdominal complications associated with the procedure such as hematoma, thrombosis, intra-abdominal abscess.  Will check labs to rule out anemia since patient is on both aspirin and Eliquis, CBC to rule out AKI or electrolyte abnormalities.  Will monitor patient on telemetry.  Will give IV fluids, fentanyl and Zofran for symptoms.  Will check UA to rule out UTI.  Clinical Course as of Oct 30 1735  Tue Oct 29, 2017  1733 CT showing left common iliac vein stent which looks in the appropriate location and patent.  No other acute findings to explain patient's symptoms.  She has no evidence of anemia, no UTI, no dehydration.  At this time explained to the patient that her symptoms can be due to being recently postop, may be some dehydration, or side effects of the Eliquis.  I told her that only her vascular surgeon should be making changes to her blood thinners at this time to prevent any thrombosis of her stents.  Recommend close follow-up with him in 24 hours for reevaluation.  In the meantime patient was only given Toradol for pain I will provide her with a short course of tramadol.  Recommend taking her off of tramadol since she is already on an aspirin and Eliquis to prevent any bleeding.  Instead recommended the patient takes 2 extra strength Tylenol 3 times a day as basal pain control.  Discussed return precautions for fever, worsening pain, no syncopal events.   [CV]    Clinical Course User Index [CV] Don Perking [place], MD     As part of my medical decision making, I reviewed the following data within the electronic MEDICAL RECORD NUMBER Nursing notes reviewed and incorporated, Labs reviewed , EKG interpreted , Old chart reviewed, Radiograph reviewed , Notes from prior ED visits and [place] Controlled Substance Database    Pertinent labs & imaging results that were available during my  care of the patient were reviewed by me and considered in my medical decision making (see chart for details).    ____________________________________________   FINAL CLINICAL IMPRESSION(S) / ED DIAGNOSES  Final diagnoses:  Syncope, unspecified syncope type  Left lower quadrant pain      NEW MEDICATIONS STARTED DURING THIS VISIT:  ED Discharge Orders        Ordered    traMADol (ULTRAM) 50 MG tablet  Every 6 hours PRN     10/29/17 1736    ondansetron (ZOFRAN ODT) 4 MG disintegrating tablet  Every 8 hours PRN     10/29/17 1736       Note:  This document was prepared using Dragon voice recognition software and may include  unintentional dictation errors.    Don PerkingVeronese, [place], MD 10/29/17 [phone]

## 2018-04-05 ENCOUNTER — Encounter: Payer: Self-pay | Admitting: Emergency Medicine

## 2018-04-05 ENCOUNTER — Emergency Department: Payer: Medicaid Other

## 2018-04-05 ENCOUNTER — Other Ambulatory Visit: Payer: Self-pay

## 2018-04-05 ENCOUNTER — Emergency Department
Admission: EM | Admit: 2018-04-05 | Discharge: 2018-04-05 | Disposition: A | Discharge status: Home / Self Care | Payer: Medicaid Other | Attending: Emergency Medicine | Admitting: Emergency Medicine

## 2018-04-05 DIAGNOSIS — R0781 Pleurodynia: Secondary | ICD-10-CM | POA: Insufficient documentation

## 2018-04-05 DIAGNOSIS — Z79899 Other long term (current) drug therapy: Secondary | ICD-10-CM | POA: Insufficient documentation

## 2018-04-05 DIAGNOSIS — Z87891 Personal history of nicotine dependence: Secondary | ICD-10-CM | POA: Insufficient documentation

## 2018-04-05 DIAGNOSIS — Y998 Other external cause status: Secondary | ICD-10-CM | POA: Insufficient documentation

## 2018-04-05 DIAGNOSIS — M25531 Pain in right wrist: Secondary | ICD-10-CM | POA: Insufficient documentation

## 2018-04-05 DIAGNOSIS — M25511 Pain in right shoulder: Secondary | ICD-10-CM | POA: Insufficient documentation

## 2018-04-05 DIAGNOSIS — W228XXA Striking against or struck by other objects, initial encounter: Secondary | ICD-10-CM | POA: Insufficient documentation

## 2018-04-05 DIAGNOSIS — Y9389 Activity, other specified: Secondary | ICD-10-CM | POA: Insufficient documentation

## 2018-04-05 DIAGNOSIS — Y929 Unspecified place or not applicable: Secondary | ICD-10-CM | POA: Insufficient documentation

## 2018-04-05 DIAGNOSIS — M25551 Pain in right hip: Secondary | ICD-10-CM | POA: Insufficient documentation

## 2018-04-05 MED ORDER — FENTANYL CITRATE (PF) 100 MCG/2ML IJ SOLN
100.0000 ug | Freq: Once | INTRAMUSCULAR | Status: AC
Start: 2018-04-05 — End: 2018-04-05
  Administered 2018-04-05: 100 ug via INTRAMUSCULAR
  Filled 2018-04-05: qty 2

## 2018-04-05 NOTE — ED Notes (Signed)
[employer] contacted and will call me back.

## 2018-04-05 NOTE — ED Notes (Signed)
Pt friend, Wendie AgresteJasmine Mabe out to nurses desk to give phone number [phone] that the patient is able to stay at her house until she can find further housing situation. States that the patient has 3 kids that are at friend's houses at this time and are safe.
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
## 2018-04-05 NOTE — Discharge Instructions (Signed)
Return to the emergency room for any new or worrisome symptoms, please follow-up with orthopedic surgery.  Take over-the-counter medication for the pain as tolerated.

## 2018-04-05 NOTE — ED Notes (Signed)
Pt alert and oriented X4, active, cooperative, pt in NAD. RR even and unlabored, color WNL.  Pt informed to return if any life threatening symptoms occur.  Discharge and followup instructions reviewed. Ambulates safely. 
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
## 2018-04-05 NOTE — ED Notes (Signed)
ED Provider at bedside. 
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
## 2018-04-05 NOTE — ED Notes (Signed)
Patient transported to X-ray 
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
## 2018-05-23 ENCOUNTER — Emergency Department: Payer: Self-pay

## 2018-05-23 ENCOUNTER — Emergency Department
Admission: EM | Admit: 2018-05-23 | Discharge: 2018-05-23 | Disposition: A | Discharge status: Home / Self Care | Payer: Self-pay | Attending: Emergency Medicine | Admitting: Emergency Medicine

## 2018-05-23 ENCOUNTER — Other Ambulatory Visit: Payer: Self-pay

## 2018-05-23 DIAGNOSIS — Z87891 Personal history of nicotine dependence: Secondary | ICD-10-CM | POA: Insufficient documentation

## 2018-05-23 DIAGNOSIS — R1084 Generalized abdominal pain: Secondary | ICD-10-CM

## 2018-05-23 DIAGNOSIS — K439 Ventral hernia without obstruction or gangrene: Secondary | ICD-10-CM | POA: Insufficient documentation

## 2018-05-23 LAB — CBC
HEMATOCRIT: 42.6 % (ref 36.0–46.0)
HEMOGLOBIN: 14.5 g/dL (ref 12.0–15.0)
MCH: 28.8 pg (ref 26.0–34.0)
MCHC: 34 g/dL (ref 30.0–36.0)
MCV: 84.7 fL (ref 80.0–100.0)
Platelets: 209 10*3/uL (ref 150–400)
RBC: 5.03 MIL/uL (ref 3.87–5.11)
RDW: 14.1 % (ref 11.5–15.5)
WBC: 5.1 10*3/uL (ref 4.0–10.5)
nRBC: 0 % (ref 0.0–0.2)

## 2018-05-23 LAB — COMPREHENSIVE METABOLIC PANEL
ALBUMIN: 3.9 g/dL (ref 3.5–5.0)
ALT: 14 U/L (ref 0–44)
AST: 17 U/L (ref 15–41)
Alkaline Phosphatase: 73 U/L (ref 38–126)
Anion gap: 4 — ABNORMAL LOW (ref 5–15)
BUN: 13 mg/dL (ref 6–20)
CHLORIDE: 108 mmol/L (ref 98–111)
CO2: 26 mmol/L (ref 22–32)
Calcium: 8.9 mg/dL (ref 8.9–10.3)
Creatinine, Ser: 0.7 mg/dL (ref 0.44–1.00)
GFR calc Af Amer: >60 mL/min (ref >60)
GFR calc non Af Amer: >60 mL/min (ref >60)
GLUCOSE: 131 mg/dL — AB (ref 70–99)
POTASSIUM: 4.2 mmol/L (ref 3.5–5.1)
Sodium: 138 mmol/L (ref 135–145)
Total Bilirubin: 1.1 mg/dL (ref 0.3–1.2)
Total Protein: 7 g/dL (ref 6.5–8.1)

## 2018-05-23 LAB — PREGNANCY, URINE: PREG TEST UR: NEGATIVE

## 2018-05-23 LAB — URINALYSIS, COMPLETE (UACMP) WITH MICROSCOPIC
BACTERIA UA: NONE SEEN
Bilirubin Urine: NEGATIVE
Glucose, UA: NEGATIVE mg/dL
HGB URINE DIPSTICK: NEGATIVE
KETONES UR: NEGATIVE mg/dL
Leukocytes,Ua: NEGATIVE
NITRITE: NEGATIVE
PROTEIN: NEGATIVE mg/dL
Specific Gravity, Urine: 1.023 (ref 1.005–1.030)
pH: 5 (ref 5.0–8.0)

## 2018-05-23 LAB — LIPASE, BLOOD: LIPASE: 26 U/L (ref 11–51)

## 2018-05-23 MED ORDER — IOPAMIDOL (ISOVUE-370) INJECTION 76%
125.0000 mL | Freq: Once | INTRAVENOUS | Status: AC | PRN
  Administered 2018-05-23: 125 mL via INTRAVENOUS

## 2018-05-23 MED ORDER — SODIUM CHLORIDE 0.9 % IV BOLUS
1000.0000 mL | Freq: Once | INTRAVENOUS | Status: AC
Start: 2018-05-23 — End: 2018-05-23
  Administered 2018-05-23: 1000 mL via INTRAVENOUS

## 2018-05-23 MED ORDER — IOHEXOL 350 MG/ML SOLN: 75.0000 mL | Freq: Once | INTRAVENOUS | Status: DC | PRN
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
## 2018-05-23 NOTE — ED Notes (Signed)
New IV running 1L NS from [facility] now as previous 2 IV's had been removed.

## 2018-05-23 NOTE — ED Notes (Signed)
PT given food tray and drink okay per EDP Stafford.
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
## 2018-09-12 ENCOUNTER — Encounter: Payer: Self-pay | Admitting: Obstetrics & Gynecology

## 2018-09-12 ENCOUNTER — Other Ambulatory Visit: Payer: Self-pay

## 2018-09-12 ENCOUNTER — Ambulatory Visit (INDEPENDENT_AMBULATORY_CARE_PROVIDER_SITE_OTHER): Payer: Medicaid Other | Admitting: Obstetrics & Gynecology

## 2018-09-12 VITALS — BP 120/80 | Ht 65.5 in | Wt 264.0 lb

## 2018-09-12 DIAGNOSIS — F3281 Premenstrual dysphoric disorder: Secondary | ICD-10-CM

## 2018-09-12 DIAGNOSIS — Z3046 Encounter for surveillance of implantable subdermal contraceptive: Secondary | ICD-10-CM

## 2018-09-12 DIAGNOSIS — Z3049 Encounter for surveillance of other contraceptives: Secondary | ICD-10-CM

## 2018-09-12 NOTE — Progress Notes (Signed)
  History of Present Illness:  Sandy [name] is a 28 y.o. that had a Nexplanon subdermal implant placed approximately 18 months ago. Since that time, she states that she has not had irregular bleeding, and has not had pain in her arm or fingers. Incisional concerns: No.  She feels like the implant is broken or bent.  She reports worsening mood changes for the last month.  Also weight gain has been a concern of hers.  She has recent stress, her husband passed from drug OD (she was separated, has 2 kids w him).  Recently moved.  She is sexually active w new partner.  PMHx: She  has a past medical history of Bicornate uterus, Complication of anesthesia, Family history of adverse reaction to anesthesia, Genital HSV, GERD (gastroesophageal reflux disease), and Obesity affecting pregnancy. Also,  has a past surgical history that includes Tonsillectomy; ears (Bilateral); Esophagogastroduodenoscopy (egd) with propofol (N/A, 05/04/2016); Cesarean section (2012); Cesarean section (N/A, 11/29/2015); and Cesarean section (N/A, 12/18/2016)., family history includes Diabetes in her maternal aunt; Hyperlipidemia in her maternal aunt.,  reports that she quit smoking about 5 years ago. She has never used smokeless tobacco. She reports current alcohol use. She reports that she does not use drugs.  She has a current medication list which includes the following prescription(s): famotidine, acetaminophen, ibuprofen, ondansetron, sucralfate, and tramadol, and the following Facility-Administered Medications: etonogestrel. Also, is allergic to oxycodone-acetaminophen; percocet [oxycodone-acetaminophen]; tape; and fluticasone.  Review of Systems  All other systems reviewed and are negative.   Physical Exam:  BP 120/80   Ht 5' 5.5" (1.664 m)   Wt 264 lb (119.7 kg)   LMP 08/22/2018   BMI 43.26 kg/m  Body mass index is 43.26 kg/m. Constitutional: Well nourished, well developed female in no acute distress.  Neuro: Grossly  intact Psych:  Normal mood and affect.   MS: left arm with palpable implant and no skin disruption, intact  Assessment: Nexplanon present in proper location PMDD/ mood and weight changes, more likely stress related than hormone related as it has been in place so long  Plan: She was told to continue to use barrier contraception, in order to prevent any STIs, and to take a home pregnancy test or call us if she ever thinks she may be pregnant, and that her Nexplanon expires in 3 years.  Will give one month of OCP to overlap and assess hormonal component to sx's.    Due for PAP.  F/U one month for annual/PAP and to re-assess sx's then.    Nexplanon intact and in no urgent need for removal  A total of 15 minutes were spent face-to-face with the patient during this encounter and over half of that time dealt with counseling and coordination of care.  Annamarie Major, MD, [facility], [facility] 09/12/2018  11:46 AM

## 2018-10-17 ENCOUNTER — Other Ambulatory Visit: Payer: Self-pay

## 2018-10-17 ENCOUNTER — Encounter: Payer: Self-pay | Admitting: Obstetrics and Gynecology

## 2018-10-17 ENCOUNTER — Ambulatory Visit (INDEPENDENT_AMBULATORY_CARE_PROVIDER_SITE_OTHER): Payer: Medicaid Other | Admitting: Obstetrics and Gynecology

## 2018-10-17 VITALS — BP 110/80 | HR 97 | Ht 65.5 in | Wt 260.0 lb

## 2018-10-17 DIAGNOSIS — N921 Excessive and frequent menstruation with irregular cycle: Secondary | ICD-10-CM

## 2018-10-17 DIAGNOSIS — Z975 Presence of (intrauterine) contraceptive device: Secondary | ICD-10-CM

## 2018-10-17 DIAGNOSIS — F418 Other specified anxiety disorders: Secondary | ICD-10-CM

## 2018-10-17 MED ORDER — TRANEXAMIC ACID 650 MG PO TABS: 1300.0000 mg | ORAL_TABLET | Freq: Three times a day (TID) | ORAL | 0 refills | Status: DC

## 2018-10-17 MED ORDER — SERTRALINE HCL 50 MG PO TABS: 50.0000 mg | ORAL_TABLET | Freq: Every day | ORAL | 2 refills | Status: DC

## 2018-10-17 NOTE — Progress Notes (Signed)
Patient ID: Sandy [name] Courtney, female   DOB: 28-May-1990, 28 y.o.   MRN: [id number]  Reason for Consult: Abdominal Pain (Heavy bleeding x 1 week, past 3 days with severe pain and heavy bleeding, Severe mood swings )   Referred by No ref. provider found  Subjective:     HPI:  Sandy [name] Sandy [name] is a 28 y.o. female. She is here today with several concerns. She reports significant home stressors. She feels like she is having mood swings that are affecting her relationships with her significant other, her children, and her job. She was advised by her boss to take a week off so that she could focus on herself as well as seek help. She reports recently that she went through the death of her husband. They were in the process of being divorced, but since it was not finalized at the time of his death by overdose she still had to manage all of his affairs and pay for his funeral. "it was stuff my Mom didn't even know how to deal with. 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 She reports getting mad at her significant other for things like him buying his child [employer].   She has also been experiencing heavy vaginal bleeding and abdominal pain with cramps. She has a nexplanon which has been in place for two years. She is generally happy with this contraception and thinks that it is ideal for her because she has a history of endometriosis as well as a bicornuate uterus. She has not tried any medications or therapies to address this break through bleeding. She normally has amenorrhea with the device.   Past Medical History:  Diagnosis Date  . Bicornate uterus   . Complication of anesthesia    itching after C-Sections  . Family history of adverse reaction to anesthesia    uncle has a hard time waking up  . Genital HSV   . GERD (gastroesophageal reflux disease)   . Obesity affecting pregnancy    BMI>40   Family History  Problem Relation Age of Onset  . Diabetes  Maternal Aunt   . Hyperlipidemia Maternal Aunt    Past Surgical History:  Procedure Laterality Date  . CESAREAN SECTION  2012   for HSV outbreak  . CESAREAN SECTION N/A 11/29/2015   Procedure: CESAREAN SECTION Baby Girl 7lb.Sandy [name], 6oz.;  Surgeon: Vena AustriaAndreas Staebler, MD;  Location: [facility] ORS;  Service: Obstetrics;  Laterality: N/A;  . CESAREAN SECTION N/A 12/18/2016   Procedure: CESAREAN SECTION;  Surgeon: Conard NovakJackson, Stephen D, MD;  Location: [facility] ORS;  Service: Obstetrics;  Laterality: N/A;  . ears Bilateral    tubes  . ESOPHAGOGASTRODUODENOSCOPY (EGD) WITH PROPOFOL N/A 05/04/2016   Procedure: ESOPHAGOGASTRODUODENOSCOPY (EGD) WITH PROPOFOL;  Surgeon: Midge Miniumarren Wohl, MD;  [facility];  Service: Endoscopy;  Laterality: N/A;  . TONSILLECTOMY      Short Social History:  Social History   Tobacco Use  . Smoking status: Former Smoker    Quit date: 04/09/2013    Years since quitting: 5.5  . Smokeless tobacco: Never Used  Substance Use Topics  . Alcohol use: Yes    Comment: 2x/month, none during pregnacy    Allergies  Allergen Reactions  . Oxycodone-Acetaminophen Itching  . Percocet [Oxycodone-Acetaminophen] Itching  . Tape     Some tapes cause blisters  . Fluticasone Swelling    Mouth swelling    Current Outpatient Medications  Medication Sig Dispense  Refill  . ibuprofen (ADVIL,MOTRIN) 200 MG tablet Take 400 mg by mouth every 6 (six) hours as needed.    . sertraline (ZOLOFT) 50 MG tablet Take 1 tablet (50 mg total) by mouth daily. 30 tablet 2  . tranexamic acid (LYSTEDA) 650 MG TABS tablet Take 2 tablets (1,300 mg total) by mouth 3 (three) times daily. Take during menses for a maximum of five days 30 tablet 0   Current Facility-Administered Medications  Medication Dose Route Frequency Provider Last Rate Last Dose  . etonogestrel (NEXPLANON) implant 68 mg  68 mg Subdermal Once Will Bonnet, MD        Review of Systems  Constitutional: Negative for chills, fatigue,  fever and unexpected weight change.  HENT: Negative for trouble swallowing.  Eyes: Negative for loss of vision.  Respiratory: Negative for cough, shortness of breath and wheezing.  Cardiovascular: Negative for chest pain, leg swelling, palpitations and syncope.  GI: Negative for abdominal pain, blood in stool, diarrhea, nausea and vomiting.  GU: Negative for difficulty urinating, dysuria, frequency and hematuria.  Musculoskeletal: Negative for back pain, leg pain and joint pain.  Skin: Negative for rash.  Neurological: Negative for dizziness, headaches, light-headedness, numbness and seizures.  Psychiatric: Positive for depressed mood. Negative for behavioral problem, confusion and sleep disturbance.        Objective:  Objective   Vitals:   10/17/18 1331  BP: 110/80  Pulse: 97  Weight: 260 lb (117.9 kg)  Height: 5' 5.5" (1.664 [name])   Body mass index is 42.61 kg/[name].  Physical Exam Vitals signs and nursing note reviewed.  Constitutional:      Appearance: She is well-developed.  HENT:     Head: Normocephalic and atraumatic.  Eyes:     Pupils: Pupils are equal, round, and reactive to light.  Cardiovascular:     Rate and Rhythm: Normal rate and regular rhythm.  Pulmonary:     Effort: Pulmonary effort is normal. No respiratory distress.  Skin:    General: Skin is warm and dry.  Neurological:     Mental Status: She is alert and oriented to person, place, and time.  Psychiatric:        Behavior: Behavior normal.        Thought Content: Thought content normal.        Judgment: Judgment normal.         Assessment/Plan:     28 yo with depression and anxiety and break through bleeding with the nexplanon.  1. Will initiate Zoloft for depression and anxiety. Discussed the importance of patient attending therapy. Provided a list of counselors to the patient. Discussed affordable group therapy at [facility] counseling center. Referral to psychiatry.  PHQ-8 score: 18 GAD-7 score: 15   2.  Break through Bleeding and cramping with nexplanon. Will start 5 day trial of lysteda to see if this remedies the issue.   Patient has upcoming appointment with Dr. Kenton Kingfisher for pap and cytology screening.   More than 40 minutes were spent face to face with the patient in the room with more than 50% of the time spent providing counseling and discussing the plan of management.    Adrian Prows MD [facility], [facility] 10/17/2018 2:29 PM

## 2018-10-17 NOTE — Patient Instructions (Addendum)
Therapists/Counselors/Psychologists   Karen Brunei Darussalamanada, LPC  & Jacqlyn KraussMichelle Van Horton    [phone]        [address]       [place], [place] [address]        Ival BibleGary Bailey, CSW [phone] [address] [place], [place] [address]  Harle BattiestJulia Tabor, WisconsinLPC        [phone]        [address], [address]      [place], [place] [address]        Chyrel Massonhevene Bryant, MS [phone] [address] [place], [place] [address]   Oscar LaJoanna Warren, LMFT       [phone]        [address]       [place], [place] [address]        Felecia Janina Thompson [phone] [address] [place], [place] [address]  Lester Carolinamanda Miller        [phone]        [address]       [place], [place] [address]        Kerin SalenBart McCormick [phone] [address] [place], [place] [address]  Tyron RussellCristin Saffo, PsyD       [phone]        [address]       [place], [place] [address]        Elita QuickCheryl Lawson, LPC [phone] [address]  [place], [place] [address]   Debarah CrapeCourtney Jones        [phone]        [address]      [place], [place] [address]         Rosana HoesLaura [facility] [phone] [email or web address]   Sation Konchella       [phone]        [address]       [place], [place] [address]        Morton StallCarmen Bork [facility] [phone] [email or web address]    Depression Screening Depression screening is a tool that your health care provider can use to learn if you have symptoms of depression. Depression is a common condition with many symptoms that are also often found in other conditions. Depression is treatable, but it must first be diagnosed. You may not know that certain feelings, thoughts, and behaviors that you are having can be symptoms of depression. Taking a depression screening test can help you and your health care provider decide if you need more assessment, or if you should be referred to a mental  health care provider. What are the screening tests?  You may have a physical exam to see if another condition is affecting your mental health. You may have a blood or urine sample taken during the physical exam.  You may be interviewed using a screening tool that was developed from research, such as one of these: ? Patient Health Questionnaire (PHQ). This is a set of either 2 or 9 questions. A health care provider who has been trained to score this screening test uses a guide to assess if your symptoms suggest that you may have depression. ? Hamilton Depression Rating Scale (HAM-D). This is a set of either 17 or 24 questions. You may be asked to take it again during or after your treatment, to see if your depression has gotten better. ? Beck Depression Inventory (BDI). This is a set of 21 multiple  choice questions. Your health care provider scores your answers to assess:  Your level of depression, ranging from mild to severe.  Your response to treatment.  Your health care provider may talk with you about your daily activities, such as eating, sleeping, work, and recreation, and ask if you have had any changes in activity.  Your health care provider may ask you to see a mental health specialist, such as a psychiatrist or psychologist, for more evaluation. Who should be screened for depression?   All adults, including adults with a family history of a mental health disorder.  Adolescents who are 74-79 years old.  People who are recovering from a myocardial infarction (MI).  Pregnant women, or women who have given birth.  People who have a long-term (chronic) illness.  Anyone who has been diagnosed with another type of a mental health disorder.  Anyone who has symptoms that could show depression. What do my results mean? Your health care provider will review the results of your depression screening, physical exam, and lab tests. Positive screens suggest that you may have depression.  Screening is the first step in getting the care that you may need. It is up to you to get your screening results. Ask your health care provider, or the department that is doing your screening tests, when your results will be ready. Talk with your health care provider about your results and diagnosis. A diagnosis of depression is made using the Diagnostic and Statistical Manual of Mental Disorders (DSM-V). This is a book that lists the number and type of symptoms that must be present for a health care provider to give a specific diagnosis.  Your health care provider may work with you to treat your symptoms of depression, or your health care provider may help you find a mental health provider who can assess, diagnose, and treat your depression. Get help right away if:  You have thoughts about hurting yourself or others. If you ever feel like you may hurt yourself or others, or have thoughts about taking your own life, get help right away. You can go to your nearest emergency department or call:  Your local emergency services (911 in the [place]  A suicide crisis helpline, such as the [place] at (445)153-7834. This is open 24 hours a day. Summary  Depression screening is the first step in getting the help that you may need.  If your screening test shows symptoms of depression (is positive), your health care provider may ask you to see a mental health provider.  Anyone who is age 78 or older should be screened for depression. This information is not intended to replace advice given to you by your health care provider. Make sure you discuss any questions you have with your health care provider. Document Released: 08/10/2016 Document Revised: 03/08/2017 Document Reviewed: 08/10/2016 Elsevier Patient Education  2020 [employer].

## 2018-10-30 ENCOUNTER — Other Ambulatory Visit: Payer: Self-pay

## 2018-10-30 ENCOUNTER — Ambulatory Visit: Payer: Medicaid Other | Admitting: Obstetrics and Gynecology

## 2018-10-31 ENCOUNTER — Ambulatory Visit: Payer: Medicaid Other | Admitting: Obstetrics & Gynecology

## 2018-11-24 ENCOUNTER — Ambulatory Visit: Payer: Medicaid Other | Admitting: Obstetrics and Gynecology

## 2018-12-01 ENCOUNTER — Other Ambulatory Visit: Payer: Self-pay | Admitting: Obstetrics and Gynecology

## 2018-12-05 ENCOUNTER — Ambulatory Visit: Payer: Medicaid Other | Admitting: Obstetrics and Gynecology

## 2018-12-11 ENCOUNTER — Encounter: Payer: Self-pay | Admitting: Emergency Medicine

## 2018-12-11 ENCOUNTER — Emergency Department
Admission: EM | Admit: 2018-12-11 | Discharge: 2018-12-11 | Disposition: A | Discharge status: Home / Self Care | Payer: Medicaid Other | Attending: Emergency Medicine | Admitting: Emergency Medicine

## 2018-12-11 ENCOUNTER — Other Ambulatory Visit: Payer: Self-pay

## 2018-12-11 DIAGNOSIS — R109 Unspecified abdominal pain: Secondary | ICD-10-CM | POA: Insufficient documentation

## 2018-12-11 DIAGNOSIS — Z5321 Procedure and treatment not carried out due to patient leaving prior to being seen by health care provider: Secondary | ICD-10-CM | POA: Insufficient documentation

## 2018-12-11 LAB — URINALYSIS, COMPLETE (UACMP) WITH MICROSCOPIC
Bacteria, UA: NONE SEEN
Bilirubin Urine: NEGATIVE
Glucose, UA: NEGATIVE mg/dL
Hgb urine dipstick: NEGATIVE
Ketones, ur: NEGATIVE mg/dL
Leukocytes,Ua: NEGATIVE
Nitrite: NEGATIVE
Protein, ur: NEGATIVE mg/dL
Specific Gravity, Urine: 1.012 (ref 1.005–1.030)
pH: 6 (ref 5.0–8.0)

## 2018-12-11 LAB — CBC
HCT: 42.6 % (ref 36.0–46.0)
Hemoglobin: 14.9 g/dL (ref 12.0–15.0)
MCH: 29.5 pg (ref 26.0–34.0)
MCHC: 35 g/dL (ref 30.0–36.0)
MCV: 84.4 fL (ref 80.0–100.0)
Platelets: 236 10*3/uL (ref 150–400)
RBC: 5.05 MIL/uL (ref 3.87–5.11)
RDW: 13.5 % (ref 11.5–15.5)
WBC: 7.7 10*3/uL (ref 4.0–10.5)
nRBC: 0 % (ref 0.0–0.2)

## 2018-12-11 LAB — COMPREHENSIVE METABOLIC PANEL
ALT: 18 U/L (ref 0–44)
AST: 15 U/L (ref 15–41)
Albumin: 4.1 g/dL (ref 3.5–5.0)
Alkaline Phosphatase: 85 U/L (ref 38–126)
Anion gap: 9 (ref 5–15)
BUN: 13 mg/dL (ref 6–20)
CO2: 26 mmol/L (ref 22–32)
Calcium: 9.3 mg/dL (ref 8.9–10.3)
Chloride: 105 mmol/L (ref 98–111)
Creatinine, Ser: 0.72 mg/dL (ref 0.44–1.00)
GFR calc Af Amer: >60 mL/min (ref >60)
GFR calc non Af Amer: >60 mL/min (ref >60)
Glucose, Bld: 100 mg/dL — ABNORMAL HIGH (ref 70–99)
Potassium: 4.3 mmol/L (ref 3.5–5.1)
Sodium: 140 mmol/L (ref 135–145)
Total Bilirubin: 0.9 mg/dL (ref 0.3–1.2)
Total Protein: 7.2 g/dL (ref 6.5–8.1)

## 2018-12-11 LAB — POCT PREGNANCY, URINE: Preg Test, Ur: NEGATIVE

## 2018-12-11 LAB — LIPASE, BLOOD: Lipase: 27 U/L (ref 11–51)

## 2018-12-11 MED ORDER — SODIUM CHLORIDE 0.9% FLUSH: 3.0000 mL | Freq: Once | INTRAVENOUS | Status: DC

## 2018-12-11 NOTE — ED Triage Notes (Signed)
C/O mid abdominal pain radiates to back.  Sent to ED from Urgent Care.  Patient states pain has been ongoing for 3 days. And today symptoms include emesis after eating.  Patient currently drinking herbalife.

## 2019-01-06 ENCOUNTER — Telehealth: Payer: Self-pay

## 2019-01-06 ENCOUNTER — Other Ambulatory Visit: Payer: Self-pay | Admitting: Obstetrics & Gynecology

## 2019-01-06 MED ORDER — VALACYCLOVIR HCL 1 G PO TABS: 1000.0000 mg | ORAL_TABLET | Freq: Every day | ORAL | 6 refills | Status: AC

## 2019-01-06 NOTE — Telephone Encounter (Signed)
Pt calling; needs rx for genital herpes outbreak; it's been two years since she last had rx.  Okay to leave vm.  [phone]

## 2019-01-06 NOTE — Telephone Encounter (Signed)
Valtrex tx dose eRx (twice daily for 5 days)

## 2019-01-07 NOTE — Telephone Encounter (Signed)
Pt aware.

## 2019-04-01 ENCOUNTER — Emergency Department: Payer: Medicaid Other

## 2019-04-01 ENCOUNTER — Encounter: Payer: Self-pay | Admitting: Emergency Medicine

## 2019-04-01 ENCOUNTER — Emergency Department
Admission: EM | Admit: 2019-04-01 | Discharge: 2019-04-01 | Disposition: A | Discharge status: Home / Self Care | Payer: Medicaid Other | Attending: Emergency Medicine | Admitting: Emergency Medicine

## 2019-04-01 ENCOUNTER — Other Ambulatory Visit: Payer: Self-pay

## 2019-04-01 DIAGNOSIS — L03115 Cellulitis of right lower limb: Secondary | ICD-10-CM | POA: Insufficient documentation

## 2019-04-01 DIAGNOSIS — Z87891 Personal history of nicotine dependence: Secondary | ICD-10-CM | POA: Insufficient documentation

## 2019-04-01 DIAGNOSIS — Z20822 Contact with and (suspected) exposure to covid-19: Secondary | ICD-10-CM

## 2019-04-01 DIAGNOSIS — Z79899 Other long term (current) drug therapy: Secondary | ICD-10-CM | POA: Insufficient documentation

## 2019-04-01 DIAGNOSIS — Z20828 Contact with and (suspected) exposure to other viral communicable diseases: Secondary | ICD-10-CM | POA: Insufficient documentation

## 2019-04-01 LAB — SARS CORONAVIRUS 2 (TAT 6-24 HRS): SARS Coronavirus 2: NEGATIVE

## 2019-04-01 MED ORDER — SULFAMETHOXAZOLE-TRIMETHOPRIM 800-160 MG PO TABS: 1.0000 | ORAL_TABLET | Freq: Two times a day (BID) | ORAL | 0 refills | Status: DC

## 2019-04-01 MED ORDER — TRAMADOL HCL 50 MG PO TABS: 50.0000 mg | ORAL_TABLET | Freq: Four times a day (QID) | ORAL | 0 refills | Status: DC | PRN

## 2019-04-01 NOTE — ED Provider Notes (Signed)
[facility] Emergency Department Provider Note  ____________________________________________   First MD Initiated Contact with Patient 04/01/19 [phone]     (approximate)  I have reviewed the triage vital signs and the nursing notes.   HISTORY  Chief Complaint Abscess   HPI Sandy [name] is a 28 y.o. female presents to the ED with complaint of possible spider bite to her right thigh.  Patient states she noted a small place yesterday while in the shower but this morning when she woke the area is red and tender.  Patient also states that last evening she began having body aches, sore throat and generalized "not feeling well".  Patient states that 2 weeks ago she was exposed to Covid with her boss testing positive.  She made everyone immediately get a Covid test and hers was reported as negative however she did not have any symptoms at that time.  Patient denies any vomiting or diarrhea, no change in taste or smell.  Patient does report some sensation of shortness of breath and tightness last night that now has cleared.  Patient has not smoked since she was 57.  She rates her pain as an 8 out of 10.       Past Medical History:  Diagnosis Date  . Bicornate uterus   . Complication of anesthesia    itching after C-Sections  . Family history of adverse reaction to anesthesia    uncle has a hard time waking up  . Genital HSV   . GERD (gastroesophageal reflux disease)   . Obesity affecting pregnancy    BMI>40    Patient Active Problem List   Diagnosis Date Noted  . Status post cesarean delivery 12/18/2016  . Indication for care in labor and delivery, antepartum 12/08/2016  . Placental abruption, antepartum, third trimester 11/28/2016  . High risk pregnancy in young multigravida, third trimester 11/28/2016  . Hx of preeclampsia, prior pregnancy, currently pregnant 11/28/2016  . History of cesarean section complicating pregnancy 24/12/7351  . BMI 40.0-44.9,  adult ([place]) 11/21/2016  . 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 Obesity complicating pregnancy, third trimester 06/04/2016  . 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 CESAREAN SECTION  2012   for HSV outbreak  . CESAREAN SECTION N/A 11/29/2015   Procedure: CESAREAN SECTION Baby Girl 7lb.Isidoro Donning.;  Surgeon: Malachy Mood, MD;  Location: [facility];  Service: Obstetrics;  Laterality: N/A;  . CESAREAN SECTION N/A 12/18/2016   Procedure: CESAREAN SECTION;  Surgeon: Will Bonnet, MD;  Location: [facility];  Service: Obstetrics;  Laterality: N/A;  . ears Bilateral    tubes  . ESOPHAGOGASTRODUODENOSCOPY (EGD) WITH PROPOFOL N/A 05/04/2016   Procedure: ESOPHAGOGASTRODUODENOSCOPY (EGD) WITH PROPOFOL;  Surgeon: Lucilla Lame, MD;  Location: [place];  Service: Endoscopy;  Laterality: N/A;  . STENT PLACEMENT ILIAC ([place] [facility])    . TONSILLECTOMY      Prior to Admission medications   Medication Sig Start Date End Date Taking? Authorizing Provider  ibuprofen (ADVIL,MOTRIN) 200 MG tablet Take 400 mg by mouth every 6 (six) hours as needed.    [provider]  sertraline (ZOLOFT) 50 MG tablet Take 1 tablet (50 mg total) by mouth daily. 10/17/18   Schuman, Stefanie Libel, MD  sulfamethoxazole-trimethoprim (BACTRIM DS) 800-160 MG tablet Take 1 tablet by mouth 2 (two) times daily. 04/01/19   Johnn Hai, PA-C  traMADol (ULTRAM) 50 MG tablet Take 1 tablet (50  mg total) by mouth every 6 (six) hours as needed. 04/01/19   Tommi RumpsSummers, Oberia Beaudoin L, PA-C  tranexamic acid (LYSTEDA) 650 MG TABS tablet Take 2 tablets (1,300 mg total) by mouth 3 (three) times daily. Take during menses for a maximum of five days 10/17/18   Natale MilchSchuman, Christanna R, MD    Allergies Oxycodone-acetaminophen, Percocet [oxycodone-acetaminophen], Tape, and Fluticasone  Family History  Problem Relation Age of Onset  . Diabetes Maternal Aunt   . Hyperlipidemia  Maternal Aunt     Social History Social History   Tobacco Use  . Smoking status: Former Smoker    Quit date: 04/09/2013    Years since quitting: 5.9  . Smokeless tobacco: Never Used  Substance Use Topics  . Alcohol use: Yes    Comment: 2x/month, none during pregnacy  . Drug use: No    Review of Systems Constitutional: Subjective fever/chills Eyes: No visual changes. ENT: Positive sore throat. Cardiovascular: Denies chest pain. Respiratory: Resolved shortness of breath.  Negative for cough.  Positive for limited tightness sensation, resolved. Gastrointestinal: No abdominal pain.  Positive nausea, no vomiting.  No diarrhea.  No constipation. Musculoskeletal: Positive for generalized muscle aches. Skin: Negative for rash. Neurological: Negative for headaches, focal weakness or numbness.  ____________________________________________   PHYSICAL EXAM:  VITAL SIGNS: ED Triage Vitals  Enc Vitals Group     BP 04/01/19 0812 134/79     Pulse Rate 04/01/19 0812 82     Resp 04/01/19 0812 18     Temp 04/01/19 0812 98 F (36.7 C)     Temp Source 04/01/19 0812 Oral     SpO2 04/01/19 0812 98 %     Weight 04/01/19 0810 225 lb (102.1 kg)     Height 04/01/19 0810 5\' 5"  (1.651 m)     Head Circumference --      Peak Flow --      Pain Score 04/01/19 0809 8     Pain Loc --      Pain Edu? --      Excl. in GC? --    Constitutional: Alert and oriented. Well appearing and in no acute distress. Eyes: Conjunctivae are normal.  Head: Atraumatic. Nose: No congestion/rhinnorhea. Mouth/Throat: Mucous membranes are moist.  Oropharynx non-erythematous. Neck: No stridor.   Cardiovascular: Normal rate, regular rhythm. Grossly normal heart sounds.  Good peripheral circulation. Respiratory: Normal respiratory effort.  No retractions. Lungs CTAB. Gastrointestinal: Soft and nontender. No distention.  Musculoskeletal: Moves upper and lower extremities that any difficulty.  Normal gait was  noted. Neurologic:  Normal speech and language. No gross focal neurologic deficits are appreciated. No gait instability. Skin:  Skin is warm, dry and intact.  Erythematous circular area mid thigh medial aspect measuring approximately 3 cm in diameter.  No drainage.  Area is tender to touch. Psychiatric: Mood and affect are normal. Speech and behavior are normal.  ____________________________________________   LABS (all labs ordered are listed, but only abnormal results are displayed)  Labs Reviewed  SARS CORONAVIRUS 2 (TAT 6-24 HRS)    RADIOLOGY  Official radiology report(s): DG Chest Portable 1 View  Result Date: 04/01/2019 CLINICAL DATA:  Chest pain. EXAM: PORTABLE CHEST 1 VIEW COMPARISON:  04/05/2018 FINDINGS: The heart size and mediastinal contours are within normal limits. Both lungs are clear. The visualized skeletal structures are unremarkable. IMPRESSION: Normal chest x-ray. Electronically Signed   By: Rudie MeyerP.  Gallerani M.D.   On: 04/01/2019 09:17    ____________________________________________   PROCEDURES  Procedure(s) performed (including Critical  Care):  Procedures   ____________________________________________   INITIAL IMPRESSION / ASSESSMENT AND PLAN / ED COURSE  As part of my medical decision making, I reviewed the following data within the electronic MEDICAL RECORD NUMBER Notes from prior ED visits and [place] Controlled Substance Database  ROBBY PIRANI was evaluated in Emergency Department on 04/01/2019 for the symptoms described in the history of present illness. She was evaluated in the context of the global COVID-19 pandemic, which necessitated consideration that the patient might be at risk for infection with the SARS-CoV-2 virus that causes COVID-19. Institutional protocols and algorithms that pertain to the evaluation of patients at risk for COVID-19 are in a state of rapid change based on information released by regulatory bodies including the CDC and federal and  state organizations. These policies and algorithms were followed during the patient's care in the ED.  28 year old female presents to the ED with complaint of possible spider bite to her right thigh area.  She also complains of some isolated shortness of breath that occurred last evening along with body aches and nausea without vomiting.  Patient states that 2 weeks ago she was closely exposed to her boss who was positive.  Her boss instructed the other 3 employees to be tested the next day and she was negative.  Patient was Covid tested today as the area of cellulitis is not large enough to cause the symptoms that she is presenting with today.  Patient is aware that she needs to quarantine until she received received the results of her test.   ____________________________________________   FINAL CLINICAL IMPRESSION(S) / ED DIAGNOSES  Final diagnoses:  Cellulitis of right lower extremity  Exposure to COVID-19 virus     ED Discharge Orders         Ordered    sulfamethoxazole-trimethoprim (BACTRIM DS) 800-160 MG tablet  2 times daily     04/01/19 0942    traMADol (ULTRAM) 50 MG tablet  Every 6 hours PRN     04/01/19 1031           Note:  This document was prepared using Dragon voice recognition software and may include unintentional dictation errors.    Tommi Rumps, PA-C 04/01/19 1023    Jene Every, MD 04/01/19 1210

## 2019-04-01 NOTE — Discharge Instructions (Addendum)
Follow-up with your primary care provider if any continued problems or return to the emergency department over the holiday weekend if any worsening or if there is an area that needs to be opened and drained.  Begin taking antibiotics as directed.  Use warm moist compresses to your leg frequently to help with the infection.  Do not stick anything in this area.  Also until you have heard from your Covid test you are to quarantine.  Should your Covid test be positive you will need to stay out of work another additional 10 days.

## 2019-04-01 NOTE — ED Notes (Signed)
See triage note  Presents with possible insect bite to thigh  Area is red and sore  Provider in room on arrival

## 2019-04-01 NOTE — ED Triage Notes (Signed)
Pt reports was bit by a spider or something yesterday and now she has a red area to her right upper thigh.

## 2019-05-11 IMAGING — US US OB LIMITED
1 series · 14 of 28 positions shown · non-contrast
Comparison: none

CLINICAL DATA: Leaking amniotic fluid

EXAM:
LIMITED OBSTETRIC ULTRASOUND

[Series 1: us ob limited · 0.22mm/px · 14 of 39 slices shown]
[im 2/39]
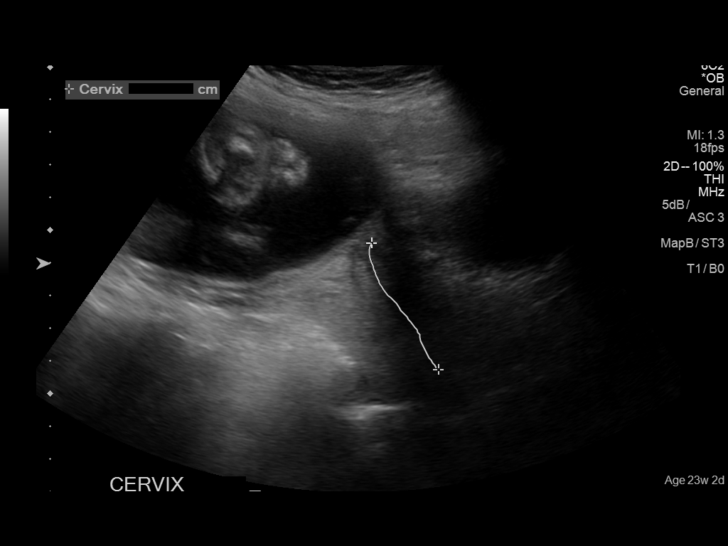
[im 5/39]
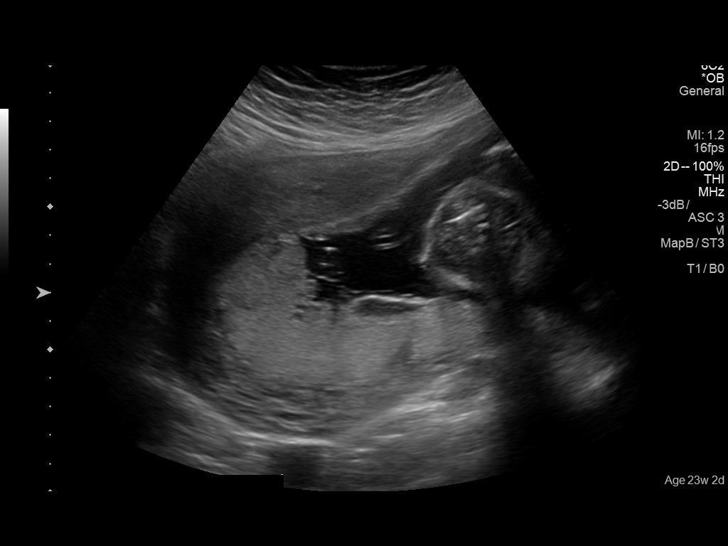
[im 8/39]
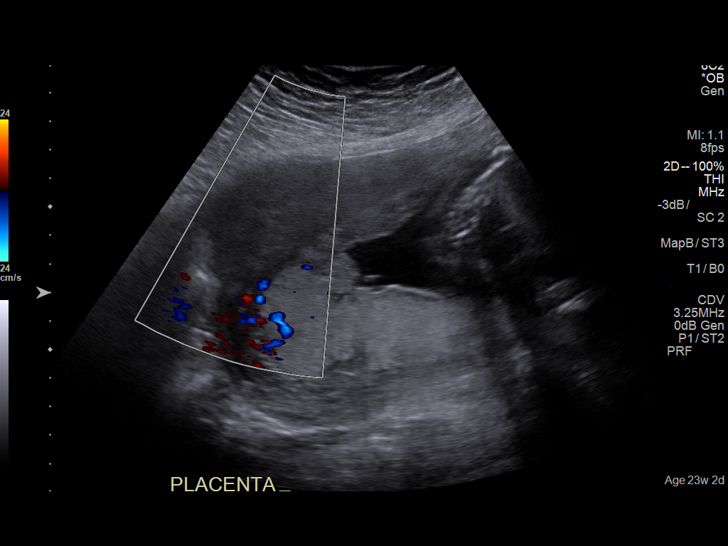
[im 10/39]
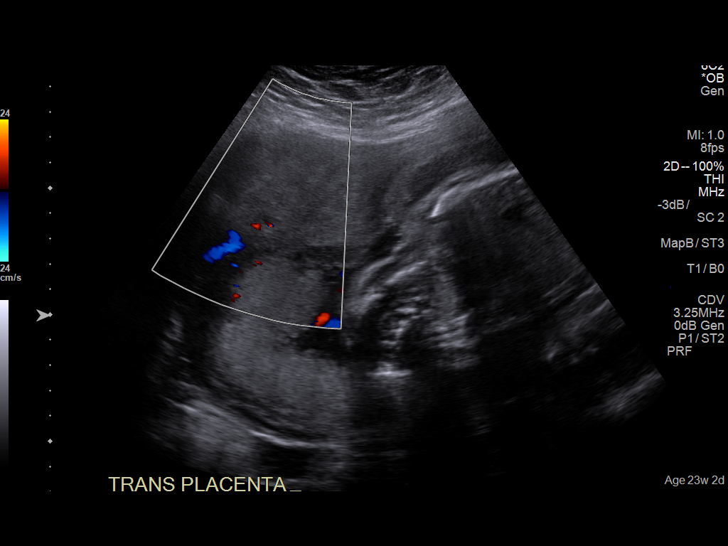
[im 13/39]
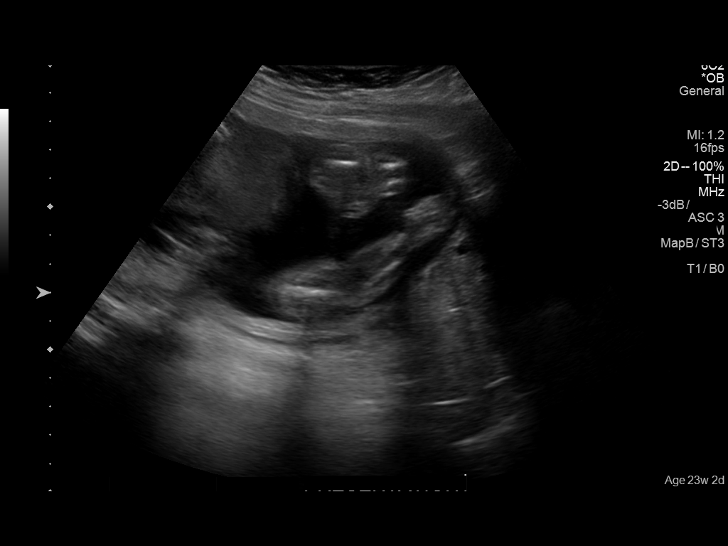
[im 16/39]
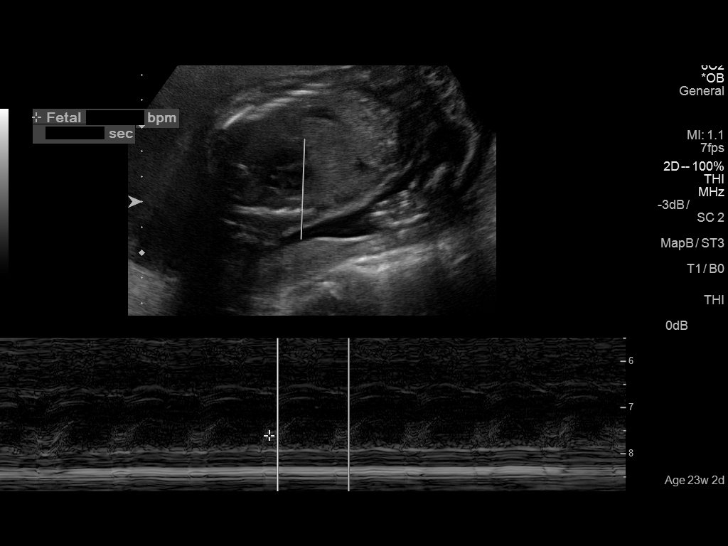
[im 19/39]
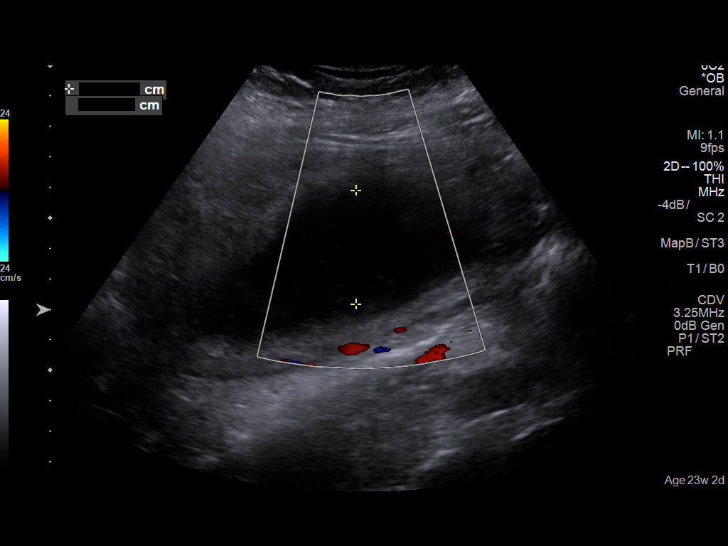
[im 22/39]
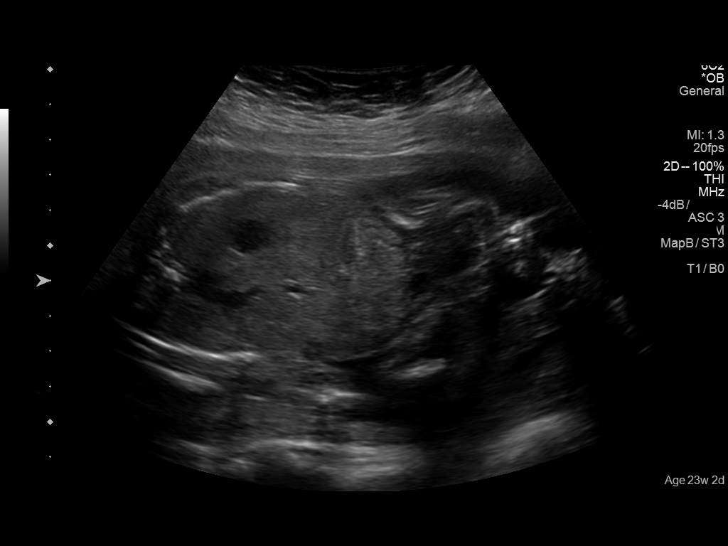
[im 24/39]
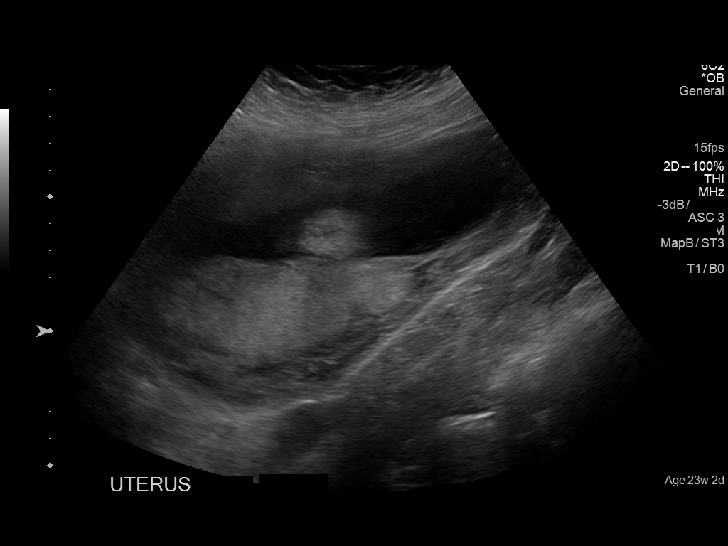
[im 27/39]
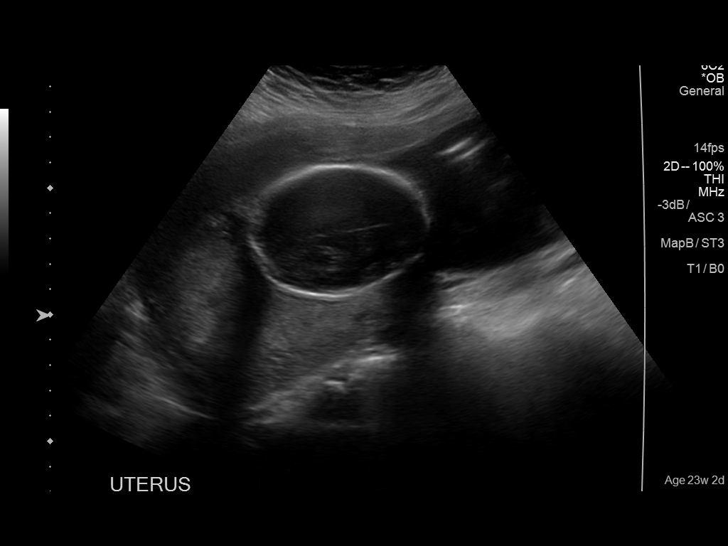
[im 30/39]
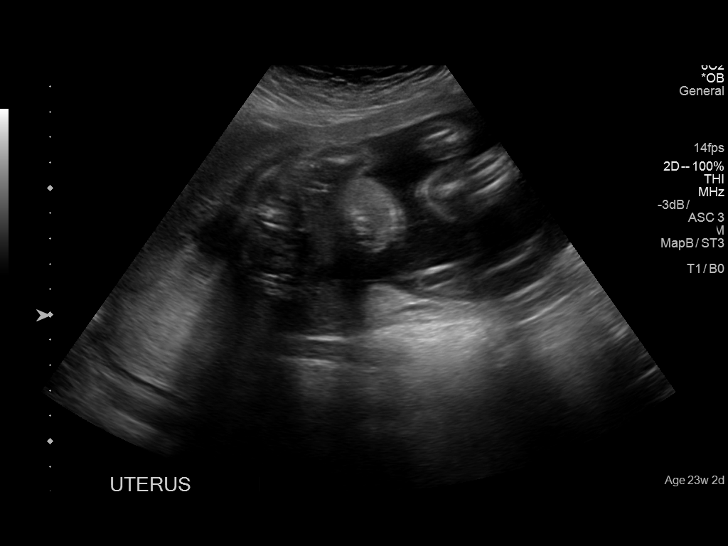
[im 33/39]
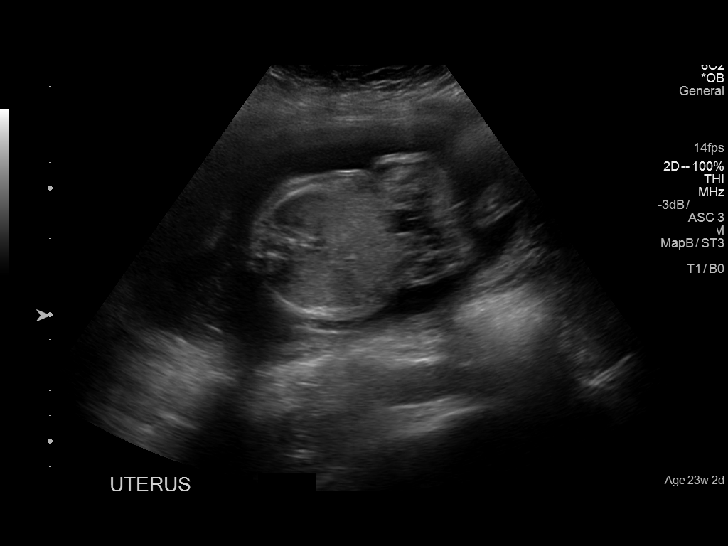
[im 36/39]
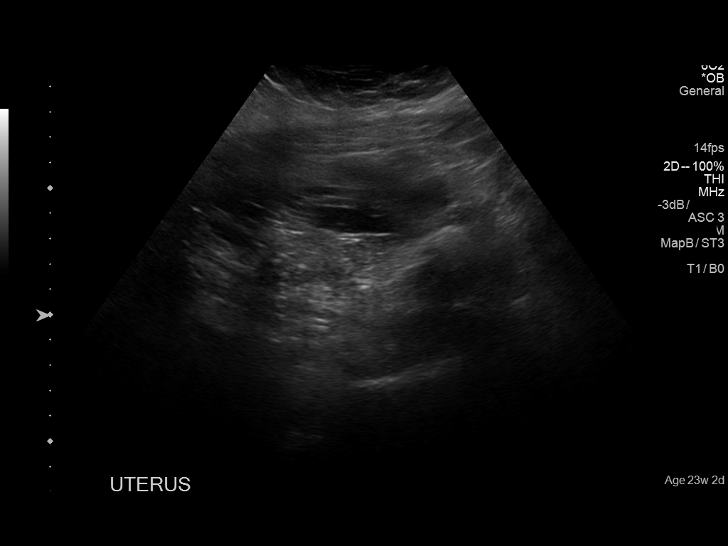
[im 39/39]
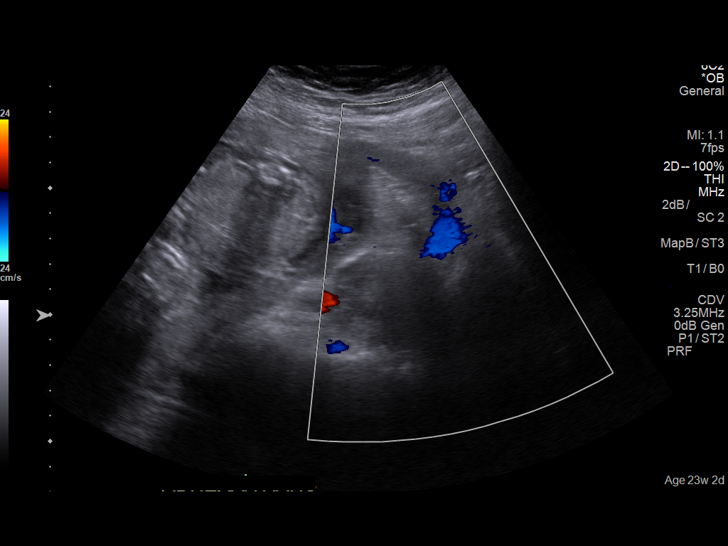

[14 of 28 positions shown; findings below may reference images not displayed]

FINDINGS: Number of Fetuses: 1

Heart Rate:  144 bpm

Movement: Visualized

Presentation:  Footling breech

Placental Location: Posterior, fundal

Previa: Not visualized

Amniotic Fluid (Subjective):  Within normal limits.

BPD:  5.26cm 22w  0d

MATERNAL FINDINGS:

Cervix:  Appears closed.

Uterus/Adnexae: No abnormality visualized. Possible transient
contraction.
IMPRESSION: Single viable intrauterine gestation as above. The cervix is closed
with normal length measurement of 4.5 cm.

This exam is performed on an emergent basis and does not
comprehensively evaluate fetal size, dating, or anatomy; follow-up
complete OB US should be considered if further fetal assessment is
warranted.

## 2019-05-13 ENCOUNTER — Encounter: Payer: Self-pay | Admitting: Emergency Medicine

## 2019-05-13 ENCOUNTER — Other Ambulatory Visit: Payer: Self-pay

## 2019-05-13 ENCOUNTER — Emergency Department
Admission: EM | Admit: 2019-05-13 | Discharge: 2019-05-14 | Disposition: A | Discharge status: Home / Self Care | Payer: Medicaid Other | Attending: Emergency Medicine | Admitting: Emergency Medicine

## 2019-05-13 DIAGNOSIS — Z5321 Procedure and treatment not carried out due to patient leaving prior to being seen by health care provider: Secondary | ICD-10-CM | POA: Insufficient documentation

## 2019-05-13 DIAGNOSIS — R1032 Left lower quadrant pain: Secondary | ICD-10-CM | POA: Insufficient documentation

## 2019-05-13 LAB — URINALYSIS, COMPLETE (UACMP) WITH MICROSCOPIC
Bacteria, UA: NONE SEEN
Bilirubin Urine: NEGATIVE
Glucose, UA: NEGATIVE mg/dL
Hgb urine dipstick: NEGATIVE
Ketones, ur: NEGATIVE mg/dL
Leukocytes,Ua: NEGATIVE
Nitrite: NEGATIVE
Protein, ur: NEGATIVE mg/dL
Specific Gravity, Urine: 1.025 (ref 1.005–1.030)
pH: 6 (ref 5.0–8.0)

## 2019-05-13 LAB — COMPREHENSIVE METABOLIC PANEL
ALT: 24 U/L (ref 0–44)
AST: 21 U/L (ref 15–41)
Albumin: 4.1 g/dL (ref 3.5–5.0)
Alkaline Phosphatase: 87 U/L (ref 38–126)
Anion gap: 8 (ref 5–15)
BUN: 14 mg/dL (ref 6–20)
CO2: 27 mmol/L (ref 22–32)
Calcium: 9.1 mg/dL (ref 8.9–10.3)
Chloride: 103 mmol/L (ref 98–111)
Creatinine, Ser: 0.68 mg/dL (ref 0.44–1.00)
GFR calc Af Amer: >60 mL/min (ref >60)
GFR calc non Af Amer: >60 mL/min (ref >60)
Glucose, Bld: 98 mg/dL (ref 70–99)
Potassium: 3.9 mmol/L (ref 3.5–5.1)
Sodium: 138 mmol/L (ref 135–145)
Total Bilirubin: 0.8 mg/dL (ref 0.3–1.2)
Total Protein: 7.3 g/dL (ref 6.5–8.1)

## 2019-05-13 LAB — CBC WITH DIFFERENTIAL/PLATELET
Abs Immature Granulocytes: 0.04 10*3/uL (ref 0.00–0.07)
Basophils Absolute: 0.1 10*3/uL (ref 0.0–0.1)
Basophils Relative: 1 %
Eosinophils Absolute: 0.3 10*3/uL (ref 0.0–0.5)
Eosinophils Relative: 3 %
HCT: 45.1 % (ref 36.0–46.0)
Hemoglobin: 15.5 g/dL — ABNORMAL HIGH (ref 12.0–15.0)
Immature Granulocytes: 0 %
Lymphocytes Relative: 25 %
Lymphs Abs: 2.3 10*3/uL (ref 0.7–4.0)
MCH: 29.2 pg (ref 26.0–34.0)
MCHC: 34.4 g/dL (ref 30.0–36.0)
MCV: 84.9 fL (ref 80.0–100.0)
Monocytes Absolute: 0.5 10*3/uL (ref 0.1–1.0)
Monocytes Relative: 5 %
Neutro Abs: 5.8 10*3/uL (ref 1.7–7.7)
Neutrophils Relative %: 66 %
Platelets: 268 10*3/uL (ref 150–400)
RBC: 5.31 MIL/uL — ABNORMAL HIGH (ref 3.87–5.11)
RDW: 13.2 % (ref 11.5–15.5)
WBC: 9 10*3/uL (ref 4.0–10.5)
nRBC: 0 % (ref 0.0–0.2)

## 2019-05-13 LAB — POCT PREGNANCY, URINE: Preg Test, Ur: NEGATIVE

## 2019-05-13 LAB — LIPASE, BLOOD: Lipase: 22 U/L (ref 11–51)

## 2019-05-13 NOTE — ED Notes (Signed)
No answer when called several times from lobby 

## 2019-05-13 NOTE — ED Triage Notes (Signed)
Patient ambulatory to triage with steady gait, without difficulty or distress noted, mask in place; pt reports left lower abd pain today accomp by nausea; st "I think its diverticulitis"

## 2019-05-14 NOTE — ED Notes (Signed)
No answer when called several times from lobby 

## 2019-05-27 ENCOUNTER — Telehealth: Payer: Self-pay

## 2019-05-27 NOTE — Telephone Encounter (Signed)
Pt having hernia removed at [facility] 3/11. Pt has gained weight and is thinking it was coming from nexplanon and is wanting to remove it . [facility] is willing to take it out but needs another [facility]. Pt aware she will need to schedule a birth control consult with one of the providers here. Pt will call after she has surgery to get this scheduled.

## 2019-06-18 HISTORY — PX: HERNIA REPAIR: SHX51

## 2019-10-09 ENCOUNTER — Emergency Department: Payer: Medicaid Other

## 2019-10-09 ENCOUNTER — Emergency Department
Admission: EM | Admit: 2019-10-09 | Discharge: 2019-10-09 | DRG: 062 | Discharge status: Against Medical Advice | Payer: Medicaid Other | Attending: Emergency Medicine | Admitting: Emergency Medicine

## 2019-10-09 DIAGNOSIS — R531 Weakness: Secondary | ICD-10-CM | POA: Diagnosis not present

## 2019-10-09 DIAGNOSIS — Z87891 Personal history of nicotine dependence: Secondary | ICD-10-CM | POA: Diagnosis not present

## 2019-10-09 DIAGNOSIS — Z20822 Contact with and (suspected) exposure to covid-19: Secondary | ICD-10-CM | POA: Diagnosis not present

## 2019-10-09 DIAGNOSIS — R519 Headache, unspecified: Secondary | ICD-10-CM | POA: Diagnosis present

## 2019-10-09 DIAGNOSIS — R200 Anesthesia of skin: Secondary | ICD-10-CM | POA: Insufficient documentation

## 2019-10-09 DIAGNOSIS — I639 Cerebral infarction, unspecified: Secondary | ICD-10-CM | POA: Diagnosis present

## 2019-10-09 DIAGNOSIS — R079 Chest pain, unspecified: Secondary | ICD-10-CM | POA: Diagnosis not present

## 2019-10-09 LAB — COMPREHENSIVE METABOLIC PANEL
ALT: 19 U/L (ref 0–44)
AST: 16 U/L (ref 15–41)
Albumin: 3.9 g/dL (ref 3.5–5.0)
Alkaline Phosphatase: 91 U/L (ref 38–126)
Anion gap: 9 (ref 5–15)
BUN: 11 mg/dL (ref 6–20)
CO2: 27 mmol/L (ref 22–32)
Calcium: 8.9 mg/dL (ref 8.9–10.3)
Chloride: 101 mmol/L (ref 98–111)
Creatinine, Ser: 1.03 mg/dL — ABNORMAL HIGH (ref 0.44–1.00)
GFR calc Af Amer: >60 mL/min (ref >60)
GFR calc non Af Amer: >60 mL/min (ref >60)
Glucose, Bld: 98 mg/dL (ref 70–99)
Potassium: 3.9 mmol/L (ref 3.5–5.1)
Sodium: 137 mmol/L (ref 135–145)
Total Bilirubin: 0.7 mg/dL (ref 0.3–1.2)
Total Protein: 6.8 g/dL (ref 6.5–8.1)

## 2019-10-09 LAB — GLUCOSE, CAPILLARY: Glucose-Capillary: 103 mg/dL — ABNORMAL HIGH (ref 70–99)

## 2019-10-09 LAB — DIFFERENTIAL
Abs Immature Granulocytes: 0.05 10*3/uL (ref 0.00–0.07)
Basophils Absolute: 0.1 10*3/uL (ref 0.0–0.1)
Basophils Relative: 1 %
Eosinophils Absolute: 0.4 10*3/uL (ref 0.0–0.5)
Eosinophils Relative: 6 %
Immature Granulocytes: 1 %
Lymphocytes Relative: 26 %
Lymphs Abs: 2 10*3/uL (ref 0.7–4.0)
Monocytes Absolute: 0.5 10*3/uL (ref 0.1–1.0)
Monocytes Relative: 6 %
Neutro Abs: 4.5 10*3/uL (ref 1.7–7.7)
Neutrophils Relative %: 60 %

## 2019-10-09 LAB — SARS CORONAVIRUS 2 BY RT PCR (HOSPITAL ORDER, PERFORMED IN ~~LOC~~ HOSPITAL LAB): SARS Coronavirus 2: NEGATIVE

## 2019-10-09 LAB — CBC
HCT: 41.4 % (ref 36.0–46.0)
Hemoglobin: 14.7 g/dL (ref 12.0–15.0)
MCH: 29.9 pg (ref 26.0–34.0)
MCHC: 35.5 g/dL (ref 30.0–36.0)
MCV: 84.1 fL (ref 80.0–100.0)
Platelets: 219 10*3/uL (ref 150–400)
RBC: 4.92 MIL/uL (ref 3.87–5.11)
RDW: 12.7 % (ref 11.5–15.5)
WBC: 7.5 10*3/uL (ref 4.0–10.5)
nRBC: 0 % (ref 0.0–0.2)

## 2019-10-09 LAB — TROPONIN I (HIGH SENSITIVITY): Troponin I (High Sensitivity): 2 ng/L (ref <18)

## 2019-10-09 LAB — PROTIME-INR
INR: 1 (ref 0.8–1.2)
Prothrombin Time: 12.8 seconds (ref 11.4–15.2)

## 2019-10-09 LAB — APTT: aPTT: 27 seconds (ref 24–36)

## 2019-10-09 MED ORDER — PROCHLORPERAZINE EDISYLATE 10 MG/2ML IJ SOLN
10.0000 mg | Freq: Once | INTRAMUSCULAR | Status: AC
  Administered 2019-10-09: 10 mg via INTRAVENOUS
  Filled 2019-10-09: qty 2

## 2019-10-09 MED ORDER — SODIUM CHLORIDE 0.9 % IV SOLN
50.0000 mL | Freq: Once | INTRAVENOUS | Status: AC
  Administered 2019-10-09: 50 mL via INTRAVENOUS

## 2019-10-09 MED ORDER — STROKE: EARLY STAGES OF RECOVERY BOOK: Freq: Once | Status: DC

## 2019-10-09 MED ORDER — ALTEPLASE (STROKE) FULL DOSE INFUSION
90.0000 mg | Freq: Once | INTRAVENOUS | Status: AC
  Administered 2019-10-09: 90 mg via INTRAVENOUS
  Filled 2019-10-09: qty 100

## 2019-10-09 MED ORDER — ONDANSETRON 4 MG PO TBDP
4.0000 mg | ORAL_TABLET | Freq: Once | ORAL | Status: AC
  Administered 2019-10-09: 4 mg via ORAL
  Filled 2019-10-09: qty 1

## 2019-10-09 MED ORDER — SODIUM CHLORIDE 0.9% FLUSH
3.0000 mL | Freq: Once | INTRAVENOUS | Status: DC
Start: 2019-10-09 — End: 2019-10-10

## 2019-10-09 MED ORDER — SODIUM CHLORIDE 0.9 % IV SOLN: INTRAVENOUS | Status: DC

## 2019-10-09 MED ORDER — LABETALOL HCL 5 MG/ML IV SOLN: 10.0000 mg | INTRAVENOUS | Status: DC | PRN

## 2019-10-09 MED ORDER — PANTOPRAZOLE SODIUM 40 MG IV SOLR: 40.0000 mg | Freq: Every day | INTRAVENOUS | Status: DC

## 2019-10-09 MED ORDER — ONDANSETRON HCL 4 MG/2ML IJ SOLN: 4.0000 mg | Freq: Four times a day (QID) | INTRAMUSCULAR | Status: DC | PRN

## 2019-10-09 MED ORDER — SENNOSIDES-DOCUSATE SODIUM 8.6-50 MG PO TABS: 1.0000 | ORAL_TABLET | Freq: Every evening | ORAL | Status: DC | PRN

## 2019-10-09 NOTE — ED Notes (Signed)
Pt is refusing VS at this time. Pt repeatedly expresses desire to leave. This RN has outlined the extreme risks of leaving AMA, and notified MD.   Pt reported increased nausea after being lain flat for CT, and after words began to express a desire to leave. MD at bedside has outlined risks in detail, as well as reinforcing the strong recommendations against leaving. Pt remains adamant about leaving.   Pt able to verbalize risks and acknowledges recommendations. AMA paperwork being prepared at this time

## 2019-10-09 NOTE — ED Notes (Signed)
RN at bedside w/ telenuero, pharmacy at bedside at this time. RN Shanda Bumps at bedside assisting w/ IV and getting patient on monitor.

## 2019-10-09 NOTE — ED Notes (Signed)
RN's Anette Riedel and Phineas Semen arrive at bedside to assist.

## 2019-10-09 NOTE — ED Provider Notes (Addendum)
[facility] Emergency Department Provider Note  ____________________________________________   I have reviewed the triage vital signs and the nursing notes.   HISTORY  Chief Complaint Code Stroke   History limited by: Not Limited   HPI Sandy [name] is a 29 y.o. female who presents to the emergency department today because of concerns for headache, chest pain and left-sided hand and arm weakness, numbness and left eye vision change.  Patient states the symptoms started this afternoon.  The patient denies similar symptoms in the past.  Patient denies history of migraines.  Records reviewed. Per medical record review patient has a history of GERD.   Past Medical History:  Diagnosis Date  . Bicornate uterus   . Complication of anesthesia    itching after C-Sections  . Family history of adverse reaction to anesthesia    uncle has a hard time waking up  . Genital HSV   . GERD (gastroesophageal reflux disease)   . Obesity affecting pregnancy    BMI>40    Patient Active Problem List   Diagnosis Date Noted  . Status post cesarean delivery 12/18/2016  . Indication for care in labor and delivery, antepartum 12/08/2016  . Placental abruption, antepartum, third trimester 11/28/2016  . High risk pregnancy in young multigravida, third trimester 11/28/2016  . Hx of preeclampsia, prior pregnancy, currently pregnant 11/28/2016  . History of cesarean section complicating pregnancy 11/28/2016  . BMI 40.0-44.9, adult (HCC) 11/21/2016  . 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 Obesity complicating pregnancy, third trimester 06/04/2016  . 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 CESAREAN SECTION  2012   for HSV outbreak  . CESAREAN SECTION N/A 11/29/2015   Procedure: CESAREAN SECTION Baby Girl 7lb.[name];  Surgeon: Vena Austria, MD;  Location: [facility] ORS;  Service: Obstetrics;  Laterality: N/A;  .  CESAREAN SECTION N/A 12/18/2016   Procedure: CESAREAN SECTION;  Surgeon: Conard Novak, MD;  Location: [facility] ORS;  Service: Obstetrics;  Laterality: N/A;  . ears Bilateral    tubes  . ESOPHAGOGASTRODUODENOSCOPY (EGD) WITH PROPOFOL N/A 05/04/2016   Procedure: ESOPHAGOGASTRODUODENOSCOPY (EGD) WITH PROPOFOL;  Surgeon: Midge Minium, MD;  Location: [facility];  Service: Endoscopy;  Laterality: N/A;  . STENT PLACEMENT ILIAC ([facility] HX)    . TONSILLECTOMY      Prior to Admission medications   Medication Sig Start Date End Date Taking? Authorizing Provider  ibuprofen (ADVIL,MOTRIN) 200 MG tablet Take 400 mg by mouth every 6 (six) hours as needed.    [provider]  sertraline (ZOLOFT) 50 MG tablet Take 1 tablet (50 mg total) by mouth daily. 10/17/18   Schuman, Jaquelyn Bitter, MD  sulfamethoxazole-trimethoprim (BACTRIM DS) 800-160 MG tablet Take 1 tablet by mouth 2 (two) times daily. 04/01/19   Tommi Rumps, PA-C  traMADol (ULTRAM) 50 MG tablet Take 1 tablet (50 mg total) by mouth every 6 (six) hours as needed. 04/01/19   Tommi Rumps, PA-C  tranexamic acid (LYSTEDA) 650 MG TABS tablet Take 2 tablets (1,300 mg total) by mouth 3 (three) times daily. Take during menses for a maximum of five days 10/17/18   Natale Milch, MD    Allergies Oxycodone-acetaminophen, Percocet [oxycodone-acetaminophen], Tape, and Fluticasone  Family History  Problem Relation Age of Onset  . Diabetes Maternal Aunt   . Hyperlipidemia Maternal Aunt     Social History Social History   Tobacco Use  . Smoking status: Former  Smoker    Quit date: 04/09/2013    Years since quitting: 6.5  . Smokeless tobacco: Never Used  Vaping Use  . Vaping Use: Never used  Substance Use Topics  . Alcohol use: Yes    Comment: 2x/month, none during pregnacy  . Drug use: No    Review of Systems Constitutional: No fever/chills Eyes: Positive for left vision change.  ENT: No sore throat. Cardiovascular:  Positive for chest pain. Respiratory: Denies shortness of breath. Gastrointestinal: No abdominal pain.  No nausea, no vomiting.  No diarrhea.   Genitourinary: Negative for dysuria. Musculoskeletal: Negative for back pain. Skin: Negative for rash. Neurological: Positive for headache. Positive for left upper extremity weakness and numbness.  ____________________________________________   PHYSICAL EXAM:  VITAL SIGNS: ED Triage Vitals  Enc Vitals Group     BP 10/09/19 1857 139/88     Pulse Rate 10/09/19 1857 76     Resp 10/09/19 1857 18     Temp 10/09/19 1904 99.2 F (37.3 C)     Temp Source 10/09/19 1904 Oral     SpO2 10/09/19 1857 98 %   Constitutional: Alert and oriented.  Eyes: Conjunctivae are normal.  ENT      Head: Normocephalic and atraumatic.      Nose: No congestion/rhinnorhea.      Mouth/Throat: Mucous membranes are moist.      Neck: No stridor. Hematological/Lymphatic/Immunilogical: No cervical lymphadenopathy. Cardiovascular: Normal rate, regular rhythm.  No murmurs, rubs, or gallops.  Respiratory: Normal respiratory effort without tachypnea nor retractions. Breath sounds are clear and equal bilaterally. No wheezes/rales/rhonchi. Gastrointestinal: Soft and non tender. No rebound. No guarding.  Genitourinary: Deferred Musculoskeletal: Normal range of motion in all extremities. No lower extremity edema. Neurologic:  Normal speech and language. Slight left upper extremity weakness Skin:  Skin is warm, dry and intact. No rash noted. Psychiatric: Mood and affect are normal. Speech and behavior are normal. Patient exhibits appropriate insight and judgment.  ____________________________________________    LABS (pertinent positives/negatives)  CMP wnl except cr 1.03 CBC wbc 7.5, hgb 14.7, plt 219    ____________________________________________    RADIOLOGY  CT head Normal  appearance  ____________________________________________   PROCEDURES  Procedures  ____________________________________________   INITIAL IMPRESSION / ASSESSMENT AND PLAN / ED COURSE  Pertinent labs & imaging results that were available during my care of the patient were reviewed by me and considered in my medical decision making (see chart for details).   Patient presented to the emergency department today with complaints of headache, chest pain and some left upper extremity weakness and left eye visual change.  Patient was called a code stroke and was evaluated by teleneurologist prior to my assessment.  Patient also had TPA started prior to my evaluation.  By the time I got into the patient's room for my initial evaluation the patient has already received TPA.  On my exam she had some very mild left upper extremity weakness however her main complaint was for her headache and chest pain.  CT head that was obtained prior to TPA administration was negative.  Patient will be admitted for further stroke work up.  Repeat head CT was obtained given worsening headache and left upper extremity weakness.  Repeat head CT did not show any acute bleed.  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  I was called to patient's room by nurse after I had already discussed with the ICU for  admission.  The patient stated that she wanted to leave.  I did discuss with patient the extreme danger and life-threatening decision that this was.  Did discuss that the medication she received to stop to the body's ability to form blood clots and that even minor injuries could cause significant and life-threatening bleeding.  She was able to verbalize back to me my concern for bleeding and death.  At this point I do not feel patient lacks capacity to make her own medical healthcare decisions.  Did ask if there was anything else that we could do to  convince her to stay.  At this point she states however that she wants to be in her own bed.  I did encourage the patient to return for any reason or if she simply changes her mind.  ____________________________________________   FINAL CLINICAL IMPRESSION(S) / ED DIAGNOSES  Final diagnoses:  Bad headache  Left-sided weakness     Note: This dictation was prepared with Dragon dictation. Any transcriptional errors that result from this process are unintentional     Phineas Semen, MD 10/09/19 7001    Phineas Semen, MD 10/09/19 2122

## 2019-10-09 NOTE — ED Notes (Addendum)
Pt presents to ED c/o episode at 1530 of left hand/arm weakness numbness, mild left leg weakness, blurry vision left eye, and headache. Pt was seen at [facility] and advised to come to ED via Ambulance. Pt elected to drive herself via POV.

## 2019-10-09 NOTE — ED Notes (Signed)
Pt reports chest pain had begun to subside, but has started to reappear. Pt reports it is not as bad as it was initially

## 2019-10-09 NOTE — Consult Note (Signed)
[facility] Tele[facility]urology Consult Services   Date of Service:   10/09/2019 18:36:54  Impression:     .  I63.9 - Cerebrovascular accident (CVA), unspecified mechanism (HCC)  Comments/Sign-Out: Patient with history of open hernia surgery March 2021 Presents with left sided weakness and numbness. Head CT: No acute Intracranial hemorrhage. NIHSS 4 Etiology for Symptoms/presentation is concerning for Acute Ischemic Stroke. risks and benefits of IV Alteplase discussed with patient and/or family. After obtaining answers to contraindication questions and verbal consent IV Alteplase was administered. Symptoms not suggestive of Large Vessel Occlusion. Will be admitted for stroke workup.  Metrics: Last Known Well: 10/09/2019 15:30:00 [facility] Notification Time: 10/09/2019 18:36:54 Arrival Time: 10/09/2019 18:05:00 Stamp Time: 10/09/2019 18:36:54 Time First Login Attempt: 10/09/2019 18:43:49 Symptoms: left sided numbness, weakness NIHSS Start Assessment Time: 10/09/2019 18:48:06 Alteplase Early Mix Decision Time: 10/09/2019 18:53:00 Patient is a candidate for Alteplase/Activase. Alteplase Medical Decision: 10/09/2019 18:53:35 Alteplase/Activase CPOE Order Time: 10/09/2019 18:56:19 Needle Time: 10/09/2019 19:00:00 Weight Noted by Staff: 144.2 kg Reason for Alteplase/Activase Delay: Delayed hospital activation of TS Service  CT head was reviewed.  ED Physician notified of diagnostic impression and management plan on 10/09/2019 19:03:16  Advanced Imaging: Advanced Imaging Not Recommended because:  Clinical Presentation is not Suggestive of LVO and NIHSS is <6   Alteplase/Activase Contraindications:  Last Known Well > 4.5 hours: No CT Head showing hemorrhage: No Ischemic stroke within 3 months: No Severe head trauma within 3 months: No Intracranial/intraspinal surgery within 3 months: No History of intracranial hemorrhage: No Symptoms and signs consistent with  an SAH: No GI malignancy or GI bleed within 21 days: No Coagulopathy: Platelets <100 000/mm3, INR >1.7, aPTT>40 s, or PT >15 s: No Treatment dose of LMWH within the previous 24 hrs: No Use of NOACs in past 48 hours: No Glycoprotein IIb/IIIa receptor inhibitors use: No Symptoms consistent with infective endocarditis: No Suspected aortic arch dissection: No Intra-axial intracranial neoplasm: No  Verbal Consent to Alteplase/Activase: I have explained to the Patient the nature of the patient's condition, reviewed the indications and contraindications to the use of Alteplase/Activase fibrinolytic agent, reviewed the indications and contraindications and the benefits to be reasonably expected compared with alternative approaches. I have discussed the likelihood of major risks or complications of this procedure including (if applicable) but not limited to loss of limb function, brain damage, paralysis, hemorrhage, infection, complications from transfusion of blood components, drug reactions, blood clots and loss of life. I have also indicated that with any procedure there is always the possibility of an unexpected complication. All questions were answered and Patient express understanding of the treatment plan and consent to the treatment.  Our recommendations are outlined below.  Recommendations: IV Alteplase/Activase recommended.  Alteplase/Activase bolus given Without Complication.   IV Alteplase/Activase Total Dose - 90.0 mg IV Alteplase/Activase Bolus Dose - 9.0 mg IV Alteplase/Activase Infusion Dose - 81.0 mg  Routine post Alteplase/Activase monitoring including neuro checks and blood pressure control during/after treatment Monitor blood pressure Check blood pressure and neuro assessment every 15 min for 2 h, then every 30 min for 6 h, and finally every hour for 16 h.  Manage Blood Pressure per post Alteplase/Activase protocol.      .  Admission to ICU     .  CT brain 24 hours post  Alteplase/Activase     .  NPO until swallowing screen performed and passed     .  No antiplatelet agents or anticoagulants (including heparin for DVT prophylaxis) in first 24  hours     .  No Foley catheter, nasogastric tube, arterial catheter or central venous catheter for 24 hr, unless absolutely necessary     .  Telemetry     .  Bedside swallow evaluation     .  HOB less than 30 degrees     .  Euglycemia     .  Avoid hyperthermia, PRN acetaminophen     .  DVT prophylaxis     .  Inpatient Neurology Consultation     .  Stroke evaluation as per inpatient neurology recommendations  Discussed with ED physician    ------------------------------------------------------------------------------  History of Present Illness: Patient is a 29 year old Female.  Patient was brought by private transportation with symptoms of left sided numbness, weakness  [facility] TeleStroke Consult Note Patient with history of open hernia surgery March 2021 last known well per patient and Presenting symptoms/Chief complaint/reason for consult: 15:30 Developed Left hand, arm numbness, weakness. Anticoagulation or Antiplatelet use: no Chart reviewed for Pertinent medical, surgical, family history. Chart reviewed for Medications list and allergies. Pertinent positive and negative review of systems noted in History of Present Illness.   Past Medical History:     . There is NO history of Stroke  Anticoagulant use:  No  Antiplatelet use: No   Examination: BP(152/98), Pulse(82), Blood Glucose(103) 1A: Level of Consciousness - Arouses to minor stimulation + 1 1B: Ask Month and Age - Both Questions Right + 0 1C: Blink Eyes & Squeeze Hands - Performs Both Tasks + 0 2: Test Horizontal Extraocular Movements - Normal + 0 3: Test Visual Fields - No Visual Loss + 0 4: Test Facial Palsy (Use Grimace if Obtunded) - Normal symmetry + 0 5A: Test Left Arm Motor Drift - Drift, but doesn't hit bed + 1 5B: Test Right  Arm Motor Drift - No Drift for 10 Seconds + 0 6A: Test Left Leg Motor Drift - Drift, but doesn't hit bed + 1 6B: Test Right Leg Motor Drift - No Drift for 5 Seconds + 0 7: Test Limb Ataxia (FNF/Heel-Shin) - No Ataxia + 0 8: Test Sensation - Mild-Moderate Loss: Less Sharp/More Dull + 1 9: Test Language/Aphasia - Normal; No aphasia + 0 10: Test Dysarthria - Normal + 0 11: Test Extinction/Inattention - No abnormality + 0  NIHSS Score: 4  Pre-Morbid Modified Ranking Scale: 0 Points = No symptoms at all   Patient/Family was informed the Neurology Consult would occur via TeleHealth consult by way of interactive audio and video telecommunications and consented to receiving care in this manner.   Patient is being evaluated for possible acute neurologic impairment and high probability of imminent or life-threatening deterioration. I spent total of 30 minutes providing care to this patient, including time for face to face visit via telemedicine, review of medical records, imaging studies and discussion of findings with providers, the patient and/or family.   Dr Driscilla Grammes   [facility] [phone]  Case [id number]

## 2019-10-09 NOTE — Progress Notes (Signed)
Was contacted by ED Provider Dr. Derrill Kay for admission due to Acute CVA of which pt received tPA in ED.  Admission orders were placed.  However upon my arrival to ED, pt is stating she is leaving AMA.  Dr. Derrill Kay has already discussed risks of leaving AMA including worsening of stroke, possible life threatening bleeding, and possible death after receiving tPA.  Pt understands the risk of leaving, and remains adamant on leaving AMA.  She has signed AMA paperwork and is leaving ED.    Harlon Ditty, AGACNP-BC [facility] Pager: [phone]

## 2019-10-09 NOTE — Discharge Instructions (Signed)
It is extremely important that you stay in bed for the next 24 hours, this includes using the restroom, please use a bed pan. If you change your mind and decide to return please do not hesitate to return. As we discussed the medication your chose to receive stops your bodies ability to form blood clots, so small injuries can cause life threatening bleeding. Additionally you could start bleeding without any injury and this can also lead to death.

## 2019-10-09 NOTE — ED Notes (Signed)
Pt reports mild nasuea

## 2019-10-09 NOTE — ED Notes (Signed)
Dr Lyndee Leo recommends TPA for pt at bedside and informs her that she is in the window for inclusion for the medication.  Dr Lyndee Leo advises pt of risks and benefits with administration of TPA, and pt consents to medication. This RN, pharmacist and Neldon Labella RN present at bedside.

## 2019-10-09 NOTE — ED Notes (Signed)
Telenuero activated Neldon Labella RN, CCRN assessing pt.

## 2019-10-09 NOTE — ED Notes (Addendum)
Telenuerologist Dr Lyndee Leo on screen at bedside at this time.

## 2019-10-09 NOTE — ED Notes (Signed)
Alteplase infusion complete, beginning NS follow through on pump

## 2019-10-09 NOTE — ED Triage Notes (Signed)
C/O left sided chest pain, left eye vision change and left hand and arm feel numb.  Overall weakness and fatigue with bad headache.  States symptoms started at 1530 today.  AAOx3.  Skin warm and dry. MAE equally and strong.  NAD

## 2019-10-16 ENCOUNTER — Telehealth: Payer: Self-pay | Admitting: *Deleted

## 2019-10-16 NOTE — Telephone Encounter (Signed)
Attempted to call patient to schedule a new patient appointment ,left message to call back [phone].

## 2019-11-19 ENCOUNTER — Ambulatory Visit: Admit: 2019-11-19 | Disposition: A | Discharge status: Home / Self Care | Payer: Medicaid Other

## 2020-01-01 ENCOUNTER — Ambulatory Visit: Payer: Self-pay

## 2020-02-22 ENCOUNTER — Encounter: Payer: Self-pay | Admitting: Obstetrics

## 2020-02-22 ENCOUNTER — Other Ambulatory Visit: Payer: Self-pay

## 2020-02-22 ENCOUNTER — Ambulatory Visit (INDEPENDENT_AMBULATORY_CARE_PROVIDER_SITE_OTHER): Payer: Medicaid Other | Admitting: Obstetrics

## 2020-02-22 ENCOUNTER — Other Ambulatory Visit (HOSPITAL_COMMUNITY)
Admission: RE | Admit: 2020-02-22 | Discharge: 2020-02-22 | Disposition: A | Discharge status: Home / Self Care | Payer: Medicaid Other | Source: Ambulatory Visit | Attending: Obstetrics | Admitting: Obstetrics

## 2020-02-22 VITALS — BP 130/80 | HR 84 | Ht 65.5 in | Wt 324.0 lb

## 2020-02-22 DIAGNOSIS — Z308 Encounter for other contraceptive management: Secondary | ICD-10-CM | POA: Diagnosis not present

## 2020-02-22 DIAGNOSIS — Z124 Encounter for screening for malignant neoplasm of cervix: Secondary | ICD-10-CM

## 2020-02-22 DIAGNOSIS — Z0000 Encounter for general adult medical examination without abnormal findings: Secondary | ICD-10-CM

## 2020-02-22 DIAGNOSIS — Z01419 Encounter for gynecological examination (general) (routine) without abnormal findings: Secondary | ICD-10-CM | POA: Insufficient documentation

## 2020-02-22 MED ORDER — LO LOESTRIN FE 1 MG-10 MCG / 10 MCG PO TABS: 1.0000 | ORAL_TABLET | Freq: Every day | ORAL | 3 refills | Status: DC

## 2020-02-22 NOTE — Progress Notes (Signed)
Gynecology Annual Exam  PCP: Patient, No Pcp Per  Chief Complaint:  Chief Complaint  Patient presents with  . Gynecologic Exam    needs something to regulate her periods; wants to get preg.; this will be fourth preg;    History of Present Illness: Patient is a 29 y.o. G3P3003 presents for annual exam. The patient has no complaints today. She does have a Hx of three CSs. Has irregular priods, and shares that she has PCOS, based on irregular cycles, hirsutism, more acne, and that she was told this by folks at [facility]She would like to renew her OCPs to regulate her cycles.Sandy [name] is insterested in another baby. She has had three CSs. She is over due for an annual PE.  Presently working at a [employer] in [employer].  She last had a pap smear in 2018. No abnormal paps. New partner. Her children are 42, 44, and 17 years of age.Marland Kitchen Her medical Hx is significan to for : PCOS, Morbid obesity, Hx of placental abruption; a ? CVA Iin July of 2021, and bicornuate uterus.  LMP: No LMP recorded (lmp unknown). Menarche:14 Average Interval: irregular, varies days Duration of flow: 5 days Heavy Menses: no Clots: no Intermenstrual Bleeding: no Postcoital Bleeding: no Dysmenorrhea: no  The patient is sexually active. She currently uses none for contraception. She denies dyspareunia.  The patient does perform self breast exams.  There is no notable family history of breast or ovarian cancer in her family.  The patient wears seatbelts: yes.  The patient has regular exercise: no.    The patient denies current symptoms of depression.    Review of Systems: Review of Systems  All other systems reviewed and are negative.   Past Medical History:  Patient Active Problem List   Diagnosis Date Noted  . CVA (cerebral vascular accident) (HCC) 10/09/2019  . Placental abruption, antepartum, third trimester 11/28/2016    Twice weekly fetal monitoring and delivery by 37 weeks per [facility]   .  BMI 40.0-44.9, adult (HCC) 11/21/2016    BMI >=40 [ ]  early 1h gtt -  [ ]  u/s for dating [ ]   [ ]  nutritional goals [ ]  folic acid 1mg  [ ]  bASA (>12 weeks) [ ]  consider nutrition consult [ ]  consider maternal EKG 1st trimester [ ]  Growth u/s 28 [ ] , 32 [ ] , 36 weeks [ ]  [ ]  NST/AFI weekly 36+ weeks (36[] , 37[] , 38[] , 39[] , 40[] ) [ ]  IOL by 41 weeks (scheduled, prn [] )    . 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 CESAREAN SECTION  2012   for HSV outbreak  . CESAREAN SECTION N/A 11/29/2015   Procedure: CESAREAN SECTION Baby Girl 7lb. .;  Surgeon: , MD;  Location: [facility] ORS;  Service: Obstetrics;  Laterality: N/A;  . CESAREAN SECTION N/A 12/18/2016   Procedure: CESAREAN SECTION;  Surgeon: , MD;  Location: [facility] OR[facility];  Service: Obstetrics;  Laterality: N/A;  . ears Bilateral    tubes  . ESOPHAGOGASTRODUODENOSCOPY (EGD) WITH PROPOFOL N/A 05/04/2016   Procedure: ESOPHAGOGASTRODUODENOSCOPY (EGD) WITH PROPOFOL;  Surgeon: , MD;  Location: [facility];  Service: Endoscopy;  Laterality: N/A;  . HERNIA REPAIR  06/18/2019  . STENT PLACEMENT ILIAC ([facility] HX)    . TONSILLECTOMY      Gynecologic History:  No LMP recorded (lmp unknown).  Contraception: none Last Pap: Results were: 2018 no abnormalities   Obstetric History: [id number]  Family History:  Family History  Problem Relation Age of Onset  . Diabetes Maternal Aunt   . Hyperlipidemia Maternal Aunt     Social History:  Social History   Socioeconomic History  . Marital status: Widowed    Spouse name: Not on file  . Number of children: Not on file  . Years of education: Not on file  . Highest education level: Not on file  Occupational History  . Not on file  Tobacco Use  . Smoking status: Former Smoker    Quit date: 04/09/2013    Years since quitting:  6.8  . Smokeless tobacco: Never Used  Vaping Use  . Vaping Use: Never used  Substance and Sexual Activity  . Alcohol use: Yes    Comment: 2x/month, none during pregnacy  . Drug use: No  . Sexual activity: Yes    Birth control/protection: None  Other Topics Concern  . Not on file  Social History Narrative  . Not on file   Social Determinants of Health   Financial Resource Strain:   . Difficulty of Paying Living Expenses: Not on file  Food Insecurity:   . Worried About Programme researcher, broadcasting/film/video in the Last Year: Not on file  . Ran Out of Food in the Last Year: Not on file  Transportation Needs:   . Lack of Transportation (Medical): Not on file  . Lack of Transportation (Non-Medical): Not on file  Physical Activity:   . Days of Exercise per Week: Not on file  . Minutes of Exercise per Session: Not on file  Stress:   . Feeling of Stress : Not on file  Social Connections:   . Frequency of Communication with Friends and Family: Not on file  . Frequency of Social Gatherings with Friends and Family: Not on file  . Attends Religious Services: Not on file  . Active Member of Clubs or Organizations: Not on file  . Attends Banker Meetings: Not on file  . Marital Status: Not on file  Intimate Partner Violence:   . Fear of Current or Ex-Partner: Not on file  . Emotionally Abused: Not on file  . Physically Abused: Not on file  . Sexually Abused: Not on file    Allergies:  Allergies  Allergen Reactions  . Oxycodone-Acetaminophen Hives and Itching  . Tape     Some tapes cause blisters  . Fluticasone Swelling    Mouth swelling    Medications: Prior to Admission medications   Medication Sig Start Date End Date Taking? Authorizing Provider  Norethindrone-Ethinyl Estradiol-Fe Biphas (LO LOESTRIN FE) 1 MG-10 MCG / 10 MCG tablet Take 1 tablet by mouth daily. 02/22/20 01/23/21  Mirna Mires, CNM    Physical Exam Vitals: Blood pressure 130/80, pulse 84, height 5' 5.5"  (1.664 m), weight (!) 324 lb (147 kg).  General: NAD, morbid obesity HEENT: normocephalic, anicteric Thyroid: no enlargement, no palpable nodules Pulmonary: No increased work of breathing, CTAB Cardiovascular: RRR, distal pulses 2+ Breast: Breast symmetrical, no tenderness, no palpable nodules or masses, no skin or nipple retraction present, no nipple discharge.  No axillary or supraclavicular lymphadenopathy. Abdomen: NABS, soft, non-tender, non-distended.  Umbilicus without lesions.  No hepatomegaly, splenomegaly or masses palpable. No evidence of hernia  Genitourinary:  External: Normal external female genitalia.  Normal urethral meatus, normal Bartholin's and Skene's glands.    Vagina: Normal vaginal mucosa, no evidence of  prolapse.    Cervix: Grossly normal in appearance, no bleeding  Uterus: Non-enlarged, mobile, normal contour.  No CMT  Adnexa: ovaries non-enlarged, no adnexal masses  Rectal: deferred  Lymphatic: no evidence of inguinal lymphadenopathy Extremities: no edema, erythema, or tenderness Neurologic: Grossly intact Psychiatric: mood appropriate, affect full  Female chaperone present for pelvic and breast  portions of the physical exam    Assessment: 29 y.o. G3P3003 routine annual exam Interested in preconception care  Plan: Problem List Items Addressed This Visit    None    Visit Diagnoses    Women's annual routine gynecological examination    -  Primary   Relevant Medications   Norethindrone-Ethinyl Estradiol-Fe Biphas (LO LOESTRIN FE) 1 MG-10 MCG / 10 MCG tablet   Other Relevant Orders   Cytology - PAP   TSH + free T4   Cervical cancer screening       Relevant Orders   Cytology - PAP   Encounter for other contraceptive management       Relevant Medications   Norethindrone-Ethinyl Estradiol-Fe Biphas (LO LOESTRIN FE) 1 MG-10 MCG / 10 MCG tablet   Morbid obesity (HCC)       Relevant Orders   TSH + free T4      1) 4) Gardasil Series discussed and if  applicable offered to patient - Patient has not previously completed 3 shot series  Pap smear was retrieved today.  2) STI screening  wasoffered and declined  3)  ASCCP guidelines and rational discussed.  Patient opts for yearly screening interval  4) Contraception - the patient is currently using  none.  She is interested in starting Contraception: and would like a method that helps her regulate her cycles.  She is interested in conceiving in the next few years. This is discussed at length today, with the goal of her achieving a higher level of health prior to having another baby. In particular, her high BMI, and the much higher risk associated with another pregnancy with her current weight is gently addressed. The option for a Bariatric consultation referral is offered. She would need to sign up for private insurance (available at her workplace) Strongly encouraged to consider focusing on her personal health status prior to her next pregnancy. I will explore how she can get a referral to Bariatric Surgery  In the meantime, I have advised her to explore ways to lose weight, including Weight Watchers, surgical weight loss, beginning a program of active living (she does not exercise much). Will contact her with info on the Bariatric referral- she may purchase some private insurance as she is presently Medicaid. Advised to start on a daily multivitamin.  5) Return in about 1 year (around 02/21/2021) for annual.   Mirna Mires, CNM  02/22/2020 5:39 PM   [facility], [facility] 02/22/2020, 1:40 PM

## 2020-02-23 LAB — TSH+FREE T4
Free T4: 1.11 ng/dL (ref 0.82–1.77)
TSH: 3.42 u[IU]/mL (ref 0.450–4.500)

## 2020-02-26 ENCOUNTER — Encounter: Payer: Self-pay | Admitting: Obstetrics

## 2020-03-01 LAB — CYTOLOGY - PAP
Comment: NEGATIVE
Diagnosis: UNDETERMINED — AB
High risk HPV: POSITIVE — AB

## 2020-03-02 ENCOUNTER — Telehealth: Payer: Self-pay

## 2020-03-02 NOTE — Telephone Encounter (Signed)
Pt calling; has questions about pap smear results; would like them explained to her.  [phone]

## 2020-03-07 ENCOUNTER — Telehealth: Payer: Self-pay

## 2020-03-07 NOTE — Telephone Encounter (Signed)
Patient reports Sandy [name] was referring her. She would like this done ASAP. Requesting a call [phone]

## 2020-03-07 NOTE — Telephone Encounter (Signed)
Spoke w/patient. She reports she received a letter from Sandy [name] that states she has some information for her and she would be calling to discuss. Advised Sandy [name] is out of the office until tomorrow afternoon. Patient mentioned the insurance at her work will not cover weight loss surgery. Sandy [name] was trying to find a weight management provider that would accept Medicaid.

## 2020-03-09 ENCOUNTER — Other Ambulatory Visit: Payer: Self-pay | Admitting: Obstetrics

## 2020-03-09 DIAGNOSIS — Z124 Encounter for screening for malignant neoplasm of cervix: Secondary | ICD-10-CM

## 2020-03-09 DIAGNOSIS — R87610 Atypical squamous cells of undetermined significance on cytologic smear of cervix (ASC-US): Secondary | ICD-10-CM

## 2020-03-09 MED ORDER — PHENTERMINE HCL 37.5 MG PO TABS: 37.5000 mg | ORAL_TABLET | Freq: Every day | ORAL | 0 refills | Status: DC

## 2020-03-09 NOTE — Progress Notes (Signed)
Spoke to [name] about her recent pap smear- ASCUS with high Risk HPV. She desires another pregnancy, but at a recent visit, we addressed her extremely high BMI. I suggested she aim for improving her overall health prior to her next pregnancy.  She does not have private insurance, and coverage by Medicaid is uncertain for Bariatric services.  An order for colposcopy is placed today.  I have placed another order for Bariatric consultation, and Sandy [name] will investigate bariatric surgeons in the area. In the meantime, Sandy [name] is now on a Keto diet, working out Sandy [name], and desires a trial of Phentermine. After reviewing the medication, and it's side effects, as well as the importance of exercise, and dietary changes, I have agreed to prescirbe her a trial of Phentermine . She will make an appointment for one month from now for a follow up medication visit.  Mirna Mires, CNM  03/09/2020 1:37 PM

## 2020-03-09 NOTE — Telephone Encounter (Signed)
Patient reports same information sent in my chart. [phone]

## 2020-03-10 ENCOUNTER — Telehealth: Payer: Self-pay | Admitting: Obstetrics

## 2020-03-10 NOTE — Telephone Encounter (Signed)
Called and left voicemail for patient to call back to be scheduled. 

## 2020-03-10 NOTE — Telephone Encounter (Signed)
-----   Message from Mirna Mires, CNM sent at 03/09/2020  1:38 PM EST ----- Hi, This patient needs a colpo, and I told her ot call the office and schedule it. FYI.

## 2020-04-04 ENCOUNTER — Ambulatory Visit: Payer: Medicaid Other | Admitting: Obstetrics

## 2020-04-05 ENCOUNTER — Other Ambulatory Visit: Payer: Self-pay

## 2020-04-05 ENCOUNTER — Encounter: Payer: Self-pay | Admitting: Obstetrics

## 2020-04-05 ENCOUNTER — Ambulatory Visit (INDEPENDENT_AMBULATORY_CARE_PROVIDER_SITE_OTHER): Payer: Medicaid Other | Admitting: Obstetrics

## 2020-04-05 DIAGNOSIS — Z79899 Other long term (current) drug therapy: Secondary | ICD-10-CM | POA: Diagnosis not present

## 2020-04-05 DIAGNOSIS — T50905A Adverse effect of unspecified drugs, medicaments and biological substances, initial encounter: Secondary | ICD-10-CM

## 2020-04-05 DIAGNOSIS — R634 Abnormal weight loss: Secondary | ICD-10-CM | POA: Diagnosis not present

## 2020-04-05 NOTE — Progress Notes (Signed)
  History of Present Illness:  Sandy [name] is a 29 y.o. who was started on  Current Outpatient Medications on File Prior to Visit  Medication Sig Dispense Refill  . cholecalciferol (VITAMIN D3) 25 MCG (1000 UNIT) tablet Take 4,000 Units by mouth daily.    . Norethindrone-Ethinyl Estradiol-Fe Biphas (LO LOESTRIN FE) 1 MG-10 MCG / 10 MCG tablet Take 1 tablet by mouth daily. 84 tablet 3  . phentermine (ADIPEX-P) 37.5 MG tablet Take 1 tablet (37.5 mg total) by mouth daily before breakfast. 30 tablet 0   No current facility-administered medications on file prior to visit.   approximately 1 month ago. Since that time, she states that her symptoms are improving. She has dropped in weight from 324 lbs to 305 lbs. She is working out at a fitness center, and has begun the process of work towards gastric surgery for weight loss. The Phentermine has noticeably decreased her appetite. She expresses optimism and eagrness to follow through with gastric.  PMHx: She  has a past medical history of Bicornate uterus, Complication of anesthesia, Family history of adverse reaction to anesthesia, Genital HSV, GERD (gastroesophageal reflux disease), Migraine, and Obesity affecting pregnancy. Also,  has a past surgical history that includes Tonsillectomy; ears (Bilateral); Esophagogastroduodenoscopy (egd) with propofol (N/A, 05/04/2016); Cesarean section (2012); Cesarean section (N/A, 11/29/2015); Cesarean section (N/A, 12/18/2016); STENT PLACEMENT ILIAC ([facility] HX); and Hernia repair (06/18/2019)., family history includes Diabetes in her maternal aunt; Hyperlipidemia in her maternal aunt.,  reports that she quit smoking about 6 years ago. She has never used smokeless tobacco. She reports previous alcohol use. She reports that she does not use drugs.  She has a current medication list which includes the following prescription(s): cholecalciferol, lo loestrin fe, and phentermine. Also, is allergic to oxycodone-acetaminophen,  tape, and fluticasone.  ROS  Physical Exam:  BP 130/80   Pulse 72   Ht 5' 5.5" (1.664 m)   Wt (!) 305 lb (138.3 kg)   LMP 03/22/2020 (Exact Date)   BMI 49.98 kg/m  Body mass index is 49.98 kg/m. Constitutional: Well nourished, well developed female in no acute distress.  Abdomen: diffusely non tender to palpation, non distended, and no masses, hernias Neuro: Grossly intact Psych:  Normal mood and affect.    Assessment: Medication treatment is going very well for her and she would like to continue another month, as it is helping her to drop weight  And is moving her towards the plan for gastric bypass in the Spring.  Plan: She will undergo no change in her medical therapy.  She was amenable to this plan and we will see her back in another month for another medication check.  Mirna Mires, CNM  04/05/2020 4:52 PM   [facility], [facility] 04/05/2020  2:18 PM

## 2020-04-06 ENCOUNTER — Other Ambulatory Visit: Payer: Self-pay | Admitting: Obstetrics

## 2020-04-06 ENCOUNTER — Other Ambulatory Visit: Payer: Self-pay

## 2020-04-06 MED ORDER — PHENTERMINE HCL 37.5 MG PO TABS: 37.5000 mg | ORAL_TABLET | Freq: Every day | ORAL | 0 refills | Status: DC

## 2020-04-07 MED ORDER — PHENTERMINE HCL 37.5 MG PO TABS: 37.5000 mg | ORAL_TABLET | Freq: Every day | ORAL | 0 refills | Status: DC

## 2020-04-07 NOTE — Telephone Encounter (Signed)
This encounter was created in error - please disregard.

## 2020-05-04 ENCOUNTER — Ambulatory Visit
Admission: RE | Admit: 2020-05-04 | Discharge: 2020-05-04 | Disposition: A | Discharge status: Home / Self Care | Payer: Medicaid Other | Source: Ambulatory Visit | Attending: Physician Assistant | Admitting: Physician Assistant

## 2020-05-04 ENCOUNTER — Other Ambulatory Visit: Payer: Self-pay

## 2020-05-04 ENCOUNTER — Ambulatory Visit (INDEPENDENT_AMBULATORY_CARE_PROVIDER_SITE_OTHER): Payer: Medicaid Other

## 2020-05-04 ENCOUNTER — Ambulatory Visit: Payer: Medicaid Other | Admitting: Obstetrics and Gynecology

## 2020-05-04 VITALS — BP 137/81 | HR 91 | Temp 99.1°F | Resp 18 | Ht 65.0 in | Wt 300.0 lb

## 2020-05-04 DIAGNOSIS — R519 Headache, unspecified: Secondary | ICD-10-CM

## 2020-05-04 DIAGNOSIS — R0989 Other specified symptoms and signs involving the circulatory and respiratory systems: Secondary | ICD-10-CM

## 2020-05-04 DIAGNOSIS — Z79899 Other long term (current) drug therapy: Secondary | ICD-10-CM | POA: Insufficient documentation

## 2020-05-04 DIAGNOSIS — U071 COVID-19: Secondary | ICD-10-CM | POA: Diagnosis not present

## 2020-05-04 DIAGNOSIS — B349 Viral infection, unspecified: Secondary | ICD-10-CM | POA: Diagnosis not present

## 2020-05-04 DIAGNOSIS — Z793 Long term (current) use of hormonal contraceptives: Secondary | ICD-10-CM | POA: Diagnosis not present

## 2020-05-04 DIAGNOSIS — R0602 Shortness of breath: Secondary | ICD-10-CM | POA: Insufficient documentation

## 2020-05-04 DIAGNOSIS — Z888 Allergy status to other drugs, medicaments and biological substances status: Secondary | ICD-10-CM | POA: Diagnosis not present

## 2020-05-04 DIAGNOSIS — R0981 Nasal congestion: Secondary | ICD-10-CM | POA: Diagnosis not present

## 2020-05-04 DIAGNOSIS — Z87891 Personal history of nicotine dependence: Secondary | ICD-10-CM | POA: Insufficient documentation

## 2020-05-04 MED ORDER — ALBUTEROL SULFATE HFA 108 (90 BASE) MCG/ACT IN AERS: 1.0000 | INHALATION_SPRAY | Freq: Four times a day (QID) | RESPIRATORY_TRACT | 0 refills | Status: DC | PRN

## 2020-05-04 NOTE — ED Provider Notes (Signed)
[facility]    CSN: [id number] Arrival date & time: 05/04/20  1822      History   Chief Complaint Chief Complaint  Patient presents with  . Headache  . Shortness of Breath    HPI Sandy [name] is a 30 y.o. female presenting for 6-day history of nasal congestion and headaches.  Patient states that over the past couple of days her headaches have started to get worse and she has felt short of breath on exertion.  She states that she took a at home Covid test yesterday on the results positive.  Patient denies any fevers, fatigue, body aches, cough, sore throat, chest pain, abdominal pain, nausea/vomiting or diarrhea.  Has been taking Sudafed with improvement in symptoms.  Patient has no history of cardiopulmonary disease.  She is supposed to have upcoming weight loss surgery in 1 month and has been advised by the clinic to have a PCR Covid test performed today since they were will want to test her before her surgery.  Patient has not been knowingly exposed to COVID-19.  Not vaccinated for COVID-19.  No other concerns or complaints today.  HPI  Past Medical History:  Diagnosis Date  . Bicornate uterus   . Complication of anesthesia    itching after C-Sections  . Family history of adverse reaction to anesthesia    uncle has a hard time waking up  . Genital HSV   . GERD (gastroesophageal reflux disease)   . Migraine   . Obesity affecting pregnancy    BMI>40    Patient Active Problem List   Diagnosis Date Noted  . Morbid obesity (HCC) 03/09/2020  . CVA (cerebral vascular accident) (HCC) 10/09/2019  . Placental abruption, antepartum, third trimester 11/28/2016  . BMI 40.0-44.9, adult (HCC) 11/21/2016  . 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 CESAREAN SECTION  2012   for HSV outbreak  . CESAREAN SECTION N/A 11/29/2015   Procedure: CESAREAN SECTION Baby Girl 7lb.Jerrilyn Cairo,  6oz.;  Surgeon: Vena AustriaAndreas Staebler, MD;  Location: [facility] ORS;  Service: Obstetrics;  Laterality: N/A;  . CESAREAN SECTION N/A 12/18/2016   Procedure: CESAREAN SECTION;  Surgeon: Conard NovakJackson, Stephen D, MD;  Location: [facility] ORS;  Service: Obstetrics;  Laterality: N/A;  . ears Bilateral    tubes  . ESOPHAGOGASTRODUODENOSCOPY (EGD) WITH PROPOFOL N/A 05/04/2016   Procedure: ESOPHAGOGASTRODUODENOSCOPY (EGD) WITH PROPOFOL;  Surgeon: Midge Miniumarren Wohl, MD;  [facility];  Service: Endoscopy;  Laterality: N/A;  . HERNIA REPAIR  06/18/2019  . STENT PLACEMENT ILIAC ([facility] HX)    . TONSILLECTOMY      OB History    Gravida  3   Para  3   Term  3   Preterm      AB      Living  3     SAB      IAB      Ectopic      Multiple  0   Live Births  3            Home Medications    Prior to Admission medications   Medication Sig Start Date End Date Taking? Authorizing Provider  albuterol (VENTOLIN HFA) 108 (90 Base) MCG/ACT inhaler Inhale 1-2 puffs into the lungs every 6 (six) hours as needed for wheezing or shortness of breath. 05/04/20 05/04/21 Yes Shirlee LatchEaves, Koryn Charlot B, PA-C  Norethindrone-Ethinyl Estradiol-Fe Biphas (LO LOESTRIN FE)  1 MG-10 MCG / 10 MCG tablet Take 1 tablet by mouth daily. 02/22/20 01/23/21 Yes Mirna Mires, CNM  cholecalciferol (VITAMIN D3) 25 MCG (1000 UNIT) tablet Take 4,000 Units by mouth daily.    [provider]  CONTRAVE 8-90 MG TB12 Take by mouth. 04/08/20   [provider]  phentermine (ADIPEX-P) 37.5 MG tablet Take 1 tablet (37.5 mg total) by mouth daily before breakfast. 04/07/20   Mirna Mires, CNM    Family History Family History  Problem Relation Age of Onset  . Diabetes Maternal Aunt   . Hyperlipidemia Maternal Aunt     Social History Social History   Tobacco Use  . Smoking status: Former Smoker    Quit date: 04/09/2013    Years since quitting: 7.0  . Smokeless tobacco: Never Used  Vaping Use  . Vaping Use: Never used   Substance Use Topics  . Alcohol use: Not Currently    Comment: 2x/month, none during pregnacy  . Drug use: No     Allergies   Oxycodone-acetaminophen, Tape, and Fluticasone   Review of Systems Review of Systems  Constitutional: Positive for fatigue. Negative for chills, diaphoresis and fever.  HENT: Positive for congestion and rhinorrhea. Negative for ear pain, sinus pressure, sinus pain and sore throat.   Respiratory: Positive for shortness of breath. Negative for cough.   Gastrointestinal: Negative for abdominal pain, nausea and vomiting.  Musculoskeletal: Negative for arthralgias and myalgias.  Skin: Negative for rash.  Neurological: Positive for headaches. Negative for weakness.  Hematological: Negative for adenopathy.     Physical Exam Triage Vital Signs ED Triage Vitals  Enc Vitals Group     BP 05/04/20 1839 137/81     Pulse Rate 05/04/20 1839 91     Resp 05/04/20 1839 18     Temp 05/04/20 1839 99.1 F (37.3 C)     Temp Source 05/04/20 1839 Oral     SpO2 05/04/20 1839 98 %     Weight 05/04/20 1836 300 lb (136.1 kg)     Height 05/04/20 1836 5\' 5"  (1.651 m)     Head Circumference --      Peak Flow --      Pain Score 05/04/20 1835 5     Pain Loc --      Pain Edu? --      Excl. in GC? --    No data found.  Updated Vital Signs BP 137/81 (BP Location: Right Arm)   Pulse 91   Temp 99.1 F (37.3 C) (Oral)   Resp 18   Ht 5\' 5"  (1.651 m)   Wt 300 lb (136.1 kg)   SpO2 98%   BMI 49.92 kg/m      Physical Exam Vitals and nursing note reviewed.  Constitutional:      General: She is not in acute distress.    Appearance: Normal appearance. She is obese. She is not ill-appearing or toxic-appearing.  HENT:     Head: Normocephalic and atraumatic.     Nose: Congestion and rhinorrhea present.     Mouth/Throat:     Mouth: Mucous membranes are moist.     Pharynx: Oropharynx is clear.  Eyes:     General: No scleral icterus.       Right eye: No discharge.         Left eye: No discharge.     Conjunctiva/sclera: Conjunctivae normal.  Cardiovascular:     Rate and Rhythm: Normal rate and regular rhythm.  Heart sounds: Normal heart sounds.  Pulmonary:     Effort: Pulmonary effort is normal. No respiratory distress.     Breath sounds: Normal breath sounds.  Musculoskeletal:     Cervical back: Neck supple.  Skin:    General: Skin is dry.  Neurological:     General: No focal deficit present.     Mental Status: She is alert. Mental status is at baseline.     Motor: No weakness.     Gait: Gait normal.  Psychiatric:        Mood and Affect: Mood normal.        Behavior: Behavior normal.        Thought Content: Thought content normal.      UC Treatments / Results  Labs (all labs ordered are listed, but only abnormal results are displayed) Labs Reviewed  SARS CORONAVIRUS 2 (TAT 6-24 HRS)    EKG   Radiology DG Chest 2 View  Result Date: 05/04/2020 CLINICAL DATA:  Shortness of breath, headache, runny nose for 6 days. Positive COVID test last night. Previous smoker. EXAM: CHEST - 2 VIEW COMPARISON:  04/01/2019 FINDINGS: The heart size and mediastinal contours are within normal limits. Both lungs are clear. The visualized skeletal structures are unremarkable. IMPRESSION: No active cardiopulmonary disease. Electronically Signed   By: Burman Nieves M.D.   On: 05/04/2020 19:11    Procedures Procedures (including critical care time)  Medications Ordered in UC Medications - No data to display  Initial Impression / Assessment and Plan / UC Course  I have reviewed the triage vital signs and the nursing notes.  Pertinent labs & imaging results that were available during my care of the patient were reviewed by me and considered in my medical decision making (see chart for details).   30 year old female presenting for 6-day history of headaches, fatigue and congestion.  Reports shortness of breath over the past couple days.  All vital signs are  normal and stable in the clinic and she is in no acute distress.  She has minor nasal congestion rhinorrhea on exam.  Chest is clear to auscultation heart regular rate and rhythm.  Chest x-ray performed today which is within normal limits.  Independently reviewed x-ray.  Patient did have positive at home COVID-19 test.  Send out Covid test performed.  Current CDC guidelines, isolation protocol and ED precautions reviewed.  Advised her to continue supportive care with increasing rest and fluids and taking the Sudafed.  I did prescribe albuterol hello for her to try to see if that helps with the shortness of breath.  As I mentioned to her, all her vital signs are normal, her chest is clear and the chest x-ray is not indicative of pneumonia or other acute cardiopulmonary disease.  Advised ED follow-up if she has any worsening symptoms especially if she develops a fever, chest pain or increased breathing difficulty.  Patient agreeable.   Final Clinical Impressions(s) / UC Diagnoses   Final diagnoses:  Viral illness  Acute nonintractable headache, unspecified headache type  Nasal congestion  Shortness of breath     Discharge Instructions     Your chest x-ray is normal.  I have sent an inhaler for you to try for the shortness of breath.  If the breathing gets worse or you have a fever or chest pain you should be seen again.  For acute worsening symptoms go to ED or call EMS.  You have received COVID testing today either for positive exposure, concerning  symptoms that could be related to COVID infection, screening purposes, or re-testing after confirmed positive.  Your test obtained today checks for active viral infection in the last 1-2 weeks. If your test is negative now, you can still test positive later. So, if you do develop symptoms you should either get re-tested and/or isolate x 5 days and then strict mask use x 5 days (unvaccinated) or mask use x 10 days (vaccinated). Please follow CDC  guidelines.  While Rapid antigen tests come back in 15-20 minutes, send out PCR/molecular test results typically come back within 1-3 days. In the mean time, if you are symptomatic, assume this could be a positive test and treat/monitor yourself as if you do have COVID.   We will call with test results if positive. Please download the MyChart app and set up a profile to access test results.   If symptomatic, go home and rest. Push fluids. Take Tylenol as needed for discomfort. Gargle warm salt water. Throat lozenges. Take Mucinex DM or Robitussin for cough. Humidifier in bedroom to ease coughing. Warm showers. Also review the COVID handout for more information.  COVID-19 INFECTION: The incubation period of COVID-19 is approximately 14 days after exposure, with most symptoms developing in roughly 4-5 days. Symptoms may range in severity from mild to critically severe. Roughly 80% of those infected will have mild symptoms. People of any age may become infected with COVID-19 and have the ability to transmit the virus. The most common symptoms include: fever, fatigue, cough, body aches, headaches, sore throat, nasal congestion, shortness of breath, nausea, vomiting, diarrhea, changes in smell and/or taste.    COURSE OF ILLNESS Some patients may begin with mild disease which can progress quickly into critical symptoms. If your symptoms are worsening please call ahead to the Emergency Department and proceed there for further treatment. Recovery time appears to be roughly 1-2 weeks for mild symptoms and 3-6 weeks for severe disease.   GO IMMEDIATELY TO ER FOR FEVER YOU ARE UNABLE TO GET DOWN WITH TYLENOL, BREATHING PROBLEMS, CHEST PAIN, FATIGUE, LETHARGY, INABILITY TO EAT OR DRINK, ETC  QUARANTINE AND ISOLATION: To help decrease the spread of COVID-19 please remain isolated if you have COVID infection or are highly suspected to have COVID infection. This means -stay home and isolate to one room in the home if  you live with others. Do not share a bed or bathroom with others while ill, sanitize and wipe down all countertops and keep common areas clean and disinfected. Stay home for 5 days. If you have no symptoms or your symptoms are resolving after 5 days, you can leave your house. Continue to wear a mask around others for 5 additional days. If you have been in close contact (within 6 feet) of someone diagnosed with COVID 19, you are advised to quarantine in your home for 14 days as symptoms can develop anywhere from 2-14 days after exposure to the virus. If you develop symptoms, you  must isolate.  Most current guidelines for COVID after exposure -unvaccinated: isolate 5 days and strict mask use x 5 days. Test on day 5 is possible -vaccinated: wear mask x 10 days if symptoms do not develop -You do not necessarily need to be tested for COVID if you have + exposure and  develop symptoms. Just isolate at home x10 days from symptom onset During this global pandemic, CDC advises to practice social distancing, try to stay at least 5ft away from others at all times. Wear a face  covering. Wash and sanitize your hands regularly and avoid going anywhere that is not necessary.  KEEP IN MIND THAT THE COVID TEST IS NOT 100% ACCURATE AND YOU SHOULD STILL DO EVERYTHING TO PREVENT POTENTIAL SPREAD OF VIRUS TO OTHERS (WEAR MASK, WEAR GLOVES, WASH HANDS AND SANITIZE REGULARLY). IF INITIAL TEST IS NEGATIVE, THIS MAY NOT MEAN YOU ARE DEFINITELY NEGATIVE. MOST ACCURATE TESTING IS DONE 5-7 DAYS AFTER EXPOSURE.   It is not advised by CDC to get re-tested after receiving a positive COVID test since you can still test positive for weeks to months after you have already cleared the virus.   *If you have not been vaccinated for COVID, I strongly suggest you consider getting vaccinated as long as there are no contraindications.      ED Prescriptions    Medication Sig Dispense Auth. Provider   albuterol (VENTOLIN HFA) 108 (90  Base) MCG/ACT inhaler Inhale 1-2 puffs into the lungs every 6 (six) hours as needed for wheezing or shortness of breath. 1 g Shirlee Latch, PA-C     PDMP not reviewed this encounter.   Shirlee Latch, PA-C 05/04/20 1934

## 2020-05-04 NOTE — Discharge Instructions (Signed)
Your chest x-ray is normal.  I have sent an inhaler for you to try for the shortness of breath.  If the breathing gets worse or you have a fever or chest pain you should be seen again.  For acute worsening symptoms go to ED or call EMS.  You have received COVID testing today either for positive exposure, concerning symptoms that could be related to COVID infection, screening purposes, or re-testing after confirmed positive.  Your test obtained today checks for active viral infection in the last 1-2 weeks. If your test is negative now, you can still test positive later. So, if you do develop symptoms you should either get re-tested and/or isolate x 5 days and then strict mask use x 5 days (unvaccinated) or mask use x 10 days (vaccinated). Please follow CDC guidelines.  While Rapid antigen tests come back in 15-20 minutes, send out PCR/molecular test results typically come back within 1-3 days. In the mean time, if you are symptomatic, assume this could be a positive test and treat/monitor yourself as if you do have COVID.   We will call with test results if positive. Please download the MyChart app and set up a profile to access test results.   If symptomatic, go home and rest. Push fluids. Take Tylenol as needed for discomfort. Gargle warm salt water. Throat lozenges. Take Mucinex DM or Robitussin for cough. Humidifier in bedroom to ease coughing. Warm showers. Also review the COVID handout for more information.  COVID-19 INFECTION: The incubation period of COVID-19 is approximately 14 days after exposure, with most symptoms developing in roughly 4-5 days. Symptoms may range in severity from mild to critically severe. Roughly 80% of those infected will have mild symptoms. People of any age may become infected with COVID-19 and have the ability to transmit the virus. The most common symptoms include: fever, fatigue, cough, body aches, headaches, sore throat, nasal congestion, shortness of breath, nausea,  vomiting, diarrhea, changes in smell and/or taste.    COURSE OF ILLNESS Some patients may begin with mild disease which can progress quickly into critical symptoms. If your symptoms are worsening please call ahead to the Emergency Department and proceed there for further treatment. Recovery time appears to be roughly 1-2 weeks for mild symptoms and 3-6 weeks for severe disease.   GO IMMEDIATELY TO ER FOR FEVER YOU ARE UNABLE TO GET DOWN WITH TYLENOL, BREATHING PROBLEMS, CHEST PAIN, FATIGUE, LETHARGY, INABILITY TO EAT OR DRINK, ETC  QUARANTINE AND ISOLATION: To help decrease the spread of COVID-19 please remain isolated if you have COVID infection or are highly suspected to have COVID infection. This means -stay home and isolate to one room in the home if you live with others. Do not share a bed or bathroom with others while ill, sanitize and wipe down all countertops and keep common areas clean and disinfected. Stay home for 5 days. If you have no symptoms or your symptoms are resolving after 5 days, you can leave your house. Continue to wear a mask around others for 5 additional days. If you have been in close contact (within 6 feet) of someone diagnosed with COVID 19, you are advised to quarantine in your home for 14 days as symptoms can develop anywhere from 2-14 days after exposure to the virus. If you develop symptoms, you  must isolate.  Most current guidelines for COVID after exposure -unvaccinated: isolate 5 days and strict mask use x 5 days. Test on day 5 is possible -vaccinated: wear mask  x 10 days if symptoms do not develop -You do not necessarily need to be tested for COVID if you have + exposure and  develop symptoms. Just isolate at home x10 days from symptom onset During this global pandemic, CDC advises to practice social distancing, try to stay at least 52ft away from others at all times. Wear a face covering. Wash and sanitize your hands regularly and avoid going anywhere that is not  necessary.  KEEP IN MIND THAT THE COVID TEST IS NOT 100% ACCURATE AND YOU SHOULD STILL DO EVERYTHING TO PREVENT POTENTIAL SPREAD OF VIRUS TO OTHERS (WEAR MASK, WEAR GLOVES, WASH HANDS AND SANITIZE REGULARLY). IF INITIAL TEST IS NEGATIVE, THIS MAY NOT MEAN YOU ARE DEFINITELY NEGATIVE. MOST ACCURATE TESTING IS DONE 5-7 DAYS AFTER EXPOSURE.   It is not advised by CDC to get re-tested after receiving a positive COVID test since you can still test positive for weeks to months after you have already cleared the virus.   *If you have not been vaccinated for COVID, I strongly suggest you consider getting vaccinated as long as there are no contraindications.

## 2020-05-04 NOTE — ED Triage Notes (Signed)
Patient states she tested positive for COVID last night on a home test. Patient c/o headache and runny nose that started on 04/28/20. She tested negative on Friday night and then tested positive last night. Patient states she is SOB with exertion and she continues to have a headache.

## 2020-05-05 LAB — SARS CORONAVIRUS 2 (TAT 6-24 HRS): SARS Coronavirus 2: POSITIVE — AB

## 2020-05-06 ENCOUNTER — Ambulatory Visit: Payer: Medicaid Other | Admitting: Obstetrics

## 2020-05-06 ENCOUNTER — Telehealth: Payer: Self-pay | Admitting: Nurse Practitioner

## 2020-05-06 NOTE — Telephone Encounter (Signed)
Called to Discuss with patient about Covid symptoms and the use of the monoclonal antibody infusion for those with mild to moderate Covid symptoms and at a high risk of hospitalization.     Pt appears to qualify for this infusion due to co-morbid conditions and/or a member of an at-risk group in accordance with the FDA Emergency Use Authorization.    Patient reports she is feeling better today. Declines treatment.   Symptom onset: 1/22 Vaccinated: no  Qualified for Infusion: Yes but will be out of the 7 day window as of today 05/06/20  Willette Alma, NP [facility]  [phone]

## 2020-05-10 HISTORY — PX: OTHER SURGICAL HISTORY: SHX169

## 2020-07-21 DIAGNOSIS — Z9884 Bariatric surgery status: Secondary | ICD-10-CM | POA: Insufficient documentation

## 2020-07-21 DIAGNOSIS — K912 Postsurgical malabsorption, not elsewhere classified: Secondary | ICD-10-CM | POA: Insufficient documentation

## 2020-07-21 DIAGNOSIS — Z903 Acquired absence of stomach [part of]: Secondary | ICD-10-CM | POA: Insufficient documentation

## 2020-07-26 ENCOUNTER — Telehealth: Payer: Self-pay

## 2020-07-26 NOTE — Telephone Encounter (Signed)
Patient called requesting a cb. I called patient, she stated she had weight loss surgery 06/06/20. Patient LMP 06/19/20, took home pregnancy test which was positive wanted to know what to do next. Advised that she would need an appointment to be seen for NOB. Patient stated she would call the office back in about 15 mins when she has a break. Drl,cma(aama)

## 2020-07-28 ENCOUNTER — Telehealth: Payer: Self-pay

## 2020-07-28 ENCOUNTER — Other Ambulatory Visit: Payer: Self-pay | Admitting: Obstetrics & Gynecology

## 2020-07-28 DIAGNOSIS — O2691 Pregnancy related conditions, unspecified, first trimester: Secondary | ICD-10-CM

## 2020-07-28 NOTE — Telephone Encounter (Signed)
It should be done today or tomorrow.  It has been ordered for stat hCG.  Please get her here for this asap.

## 2020-07-28 NOTE — Telephone Encounter (Signed)
Pt scheduled tomorrow at 10am

## 2020-07-28 NOTE — Telephone Encounter (Signed)
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 Can you order labs for her? Or should she wait longer?

## 2020-07-28 NOTE — Telephone Encounter (Signed)
Patient is scheduled   

## 2020-07-29 ENCOUNTER — Other Ambulatory Visit: Payer: Medicaid Other

## 2020-07-29 ENCOUNTER — Telehealth: Payer: Self-pay

## 2020-07-29 ENCOUNTER — Other Ambulatory Visit: Payer: Self-pay

## 2020-07-29 DIAGNOSIS — O2691 Pregnancy related conditions, unspecified, first trimester: Secondary | ICD-10-CM

## 2020-07-29 LAB — BETA HCG QUANT (REF LAB): hCG Quant: 158 m[IU]/mL

## 2020-07-29 NOTE — Telephone Encounter (Signed)
Per [facility] to schedule bedside ultrasound with him in two weeks. No ultrasound tech available

## 2020-07-29 NOTE — Telephone Encounter (Signed)
Pt aware.

## 2020-07-29 NOTE — Progress Notes (Signed)
Sch NOB w viability [place] by MD same visit [facility] as she is his pt, or me) in 2 weeks

## 2020-07-29 NOTE — Telephone Encounter (Signed)
Let her know hormone level went up in a positive way for pregnancy. Plan [place] and office visit in 2 weeks.  Some one to call an schedule w Staebler.  I sent message to [place]

## 2020-07-29 NOTE — Telephone Encounter (Signed)
Pt calling for blood results.  [phone]

## 2020-08-02 ENCOUNTER — Telehealth: Payer: Self-pay

## 2020-08-02 MED ORDER — ONDANSETRON 4 MG PO TBDP: 4.0000 mg | ORAL_TABLET | Freq: Four times a day (QID) | ORAL | 0 refills | Status: DC | PRN

## 2020-08-02 NOTE — Telephone Encounter (Signed)
Have her take her vitamins (include B12 if she can), prenatals if not already, and Ensure for protein 3 times daily.  Let me know if she needs medication for nausea (morning sickness).

## 2020-08-02 NOTE — Telephone Encounter (Signed)
Please advise 

## 2020-08-02 NOTE — Telephone Encounter (Signed)
Pt calling; had HCG level done last Fri; is having dizzy spells; had wt loss surg 06/06/20; spells of about to faint if she doesn't sit down - get hot then cold, then head spins really bad, and her body feels shakey.  This usually happens 3-4 times a day but in the last 24hrs it has happened 8 times.  Is she missing something? What to do?  Doesn't want to faint.  [phone]

## 2020-08-02 NOTE — Telephone Encounter (Signed)
Pt aware.  She would like to have nausea med rx'd.  Pharm correct in chart.

## 2020-08-05 ENCOUNTER — Other Ambulatory Visit: Payer: Self-pay | Admitting: Obstetrics & Gynecology

## 2020-08-05 ENCOUNTER — Other Ambulatory Visit: Payer: Self-pay

## 2020-08-05 ENCOUNTER — Ambulatory Visit
Admission: RE | Admit: 2020-08-05 | Discharge: 2020-08-05 | Disposition: A | Discharge status: Home / Self Care | Payer: Medicaid Other | Source: Ambulatory Visit | Attending: Obstetrics & Gynecology | Admitting: Obstetrics & Gynecology

## 2020-08-05 DIAGNOSIS — O209 Hemorrhage in early pregnancy, unspecified: Secondary | ICD-10-CM | POA: Insufficient documentation

## 2020-08-05 NOTE — Telephone Encounter (Signed)
Sch STAT US [facility] for first trimester bleeding please Let me know if cant be done

## 2020-08-05 NOTE — Telephone Encounter (Signed)
Patient is scheduled for 08/05/20 at 2:30

## 2020-08-17 ENCOUNTER — Other Ambulatory Visit: Payer: Self-pay

## 2020-08-17 ENCOUNTER — Encounter: Payer: Self-pay | Admitting: Obstetrics & Gynecology

## 2020-08-17 ENCOUNTER — Other Ambulatory Visit (HOSPITAL_COMMUNITY)
Admission: RE | Admit: 2020-08-17 | Discharge: 2020-08-17 | Disposition: A | Discharge status: Home / Self Care | Payer: Medicaid Other | Source: Ambulatory Visit | Attending: Obstetrics & Gynecology | Admitting: Obstetrics & Gynecology

## 2020-08-17 ENCOUNTER — Ambulatory Visit (INDEPENDENT_AMBULATORY_CARE_PROVIDER_SITE_OTHER): Payer: Medicaid Other | Admitting: Obstetrics & Gynecology

## 2020-08-17 VITALS — BP 120/80 | Wt 266.0 lb

## 2020-08-17 DIAGNOSIS — O0991 Supervision of high risk pregnancy, unspecified, first trimester: Secondary | ICD-10-CM

## 2020-08-17 DIAGNOSIS — R87610 Atypical squamous cells of undetermined significance on cytologic smear of cervix (ASC-US): Secondary | ICD-10-CM | POA: Insufficient documentation

## 2020-08-17 DIAGNOSIS — O99211 Obesity complicating pregnancy, first trimester: Secondary | ICD-10-CM | POA: Diagnosis not present

## 2020-08-17 DIAGNOSIS — Z9884 Bariatric surgery status: Secondary | ICD-10-CM | POA: Insufficient documentation

## 2020-08-17 DIAGNOSIS — Z1379 Encounter for other screening for genetic and chromosomal anomalies: Secondary | ICD-10-CM | POA: Diagnosis not present

## 2020-08-17 DIAGNOSIS — O99210 Obesity complicating pregnancy, unspecified trimester: Secondary | ICD-10-CM | POA: Insufficient documentation

## 2020-08-17 DIAGNOSIS — N926 Irregular menstruation, unspecified: Secondary | ICD-10-CM

## 2020-08-17 DIAGNOSIS — O3441 Maternal care for other abnormalities of cervix, first trimester: Secondary | ICD-10-CM

## 2020-08-17 DIAGNOSIS — Z98891 History of uterine scar from previous surgery: Secondary | ICD-10-CM

## 2020-08-17 DIAGNOSIS — Q513 Bicornate uterus: Secondary | ICD-10-CM

## 2020-08-17 DIAGNOSIS — Z3A09 9 weeks gestation of pregnancy: Secondary | ICD-10-CM

## 2020-08-17 DIAGNOSIS — Z369 Encounter for antenatal screening, unspecified: Secondary | ICD-10-CM

## 2020-08-17 DIAGNOSIS — O34591 Maternal care for other abnormalities of gravid uterus, first trimester: Secondary | ICD-10-CM

## 2020-08-17 DIAGNOSIS — O0990 Supervision of high risk pregnancy, unspecified, unspecified trimester: Secondary | ICD-10-CM | POA: Insufficient documentation

## 2020-08-17 DIAGNOSIS — R87810 Cervical high risk human papillomavirus (HPV) DNA test positive: Secondary | ICD-10-CM

## 2020-08-17 DIAGNOSIS — Z3A01 Less than 8 weeks gestation of pregnancy: Secondary | ICD-10-CM | POA: Diagnosis not present

## 2020-08-17 DIAGNOSIS — O3429 Maternal care due to uterine scar from other previous surgery: Secondary | ICD-10-CM

## 2020-08-17 MED ORDER — VITAMIN B-6 25 MG PO TABS: 25.0000 mg | ORAL_TABLET | Freq: Three times a day (TID) | ORAL | 4 refills | Status: DC

## 2020-08-17 MED ORDER — METOCLOPRAMIDE HCL 10 MG PO TABS: 10.0000 mg | ORAL_TABLET | Freq: Three times a day (TID) | ORAL | 4 refills | Status: DC

## 2020-08-17 NOTE — Progress Notes (Signed)
08/17/2020   Chief Complaint: Missed period (irreg cycles)  History of Present Illness: Sandy [name] is a 30 y.o. G4P3003 [redacted]w[redacted]d based on Patient's last menstrual period was 06/14/2020. with an Estimated Date of Delivery: 03/21/21, with the above CC.  She is also having second ultrasound today due to irregular cycles.  Last [place] was 2 weeks ago w gest sac but no fetal pole visualized.   Her periods were: irregular with long intervals at times.  She recently had Gastric Bypass Surgery and has had some weight loss. She was using no method when she conceived.  She has Positive signs or symptoms of nausea/vomiting of pregnancy. She has Negative signs or symptoms of miscarriage or preterm labor She identifies Negative Zika risk factors for her and her partner On any different medications around the time she conceived/early pregnancy: No  History of varicella: Yes   ROS: A 12-point review of systems was performed and negative, except as stated in the above HPI.  OBGYN History: As per HPI. OB History  Gravida Para Term Preterm AB Living  4 3 3     3   SAB IAB Ectopic Multiple Live Births        0 3    # Outcome Date GA Lbr Len/2nd Weight Sex Delivery Anes PTL Lv  4 Current           3 Term 12/18/16 [redacted]w[redacted]d  5 lb 11.7 oz (2.6 kg) F CS-LTranv Spinal  LIV  2 Term 11/29/15 [redacted]w[redacted]d  7 lb 6.2 oz (3.35 kg) F CS-LVertical Spinal  LIV  1 Term 06/12/10    M CS-LTranv   LIV    Any issues with any prior pregnancies: CS x3.  Preeclampsia with one (mild).  No h/o GDM.  She has a Bicornuate Uterus. Any prior children are healthy, doing well, without any problems or issues: yes History of pap smears: Yes. Last pap smear 11/21. Abnormal: yes ASCUS w HPV POS History of STIs: No   Past Medical History: Past Medical History:  Diagnosis Date  . Bicornate uterus   . Complication of anesthesia    itching after C-Sections  . Family history of adverse reaction to anesthesia    uncle has a hard time waking up  .  Genital HSV   . GERD (gastroesophageal reflux disease)   . Migraine   . Obesity affecting pregnancy    BMI>40    Past Surgical History: Past Surgical History:  Procedure Laterality Date  . CESAREAN SECTION  2012   for HSV outbreak  . CESAREAN SECTION N/A 11/29/2015   Procedure: CESAREAN SECTION Baby Girl 7lb.12/01/2015.;  Surgeon: Jerrilyn Cairo, MD;  Location: [facility];  Service: Obstetrics;  Laterality: N/A;  . CESAREAN SECTION N/A 12/18/2016   Procedure: CESAREAN SECTION;  Surgeon: 02/17/2017, MD;  Location: [facility];  Service: Obstetrics;  Laterality: N/A;  . ears Bilateral    tubes  . ESOPHAGOGASTRODUODENOSCOPY (EGD) WITH PROPOFOL N/A 05/04/2016   Procedure: ESOPHAGOGASTRODUODENOSCOPY (EGD) WITH PROPOFOL;  Surgeon: 05/06/2016, MD;  Location: [facility];  Service: Endoscopy;  Laterality: N/A;  . HERNIA REPAIR  06/18/2019  . STENT PLACEMENT ILIAC ([facility] HX)    . TONSILLECTOMY      Family History:  Family History  Problem Relation Age of Onset  . Diabetes Maternal Aunt   . Hyperlipidemia Maternal Aunt    She denies any female cancers, bleeding or blood clotting disorders.  She denies any history of mental retardation, birth defects or  genetic disorders in her or the FOB's history  Social History:  Social History   Socioeconomic History  . Marital status: Widowed    Spouse name: Not on file  . Number of children: Not on file  . Years of education: Not on file  . Highest education level: Not on file  Occupational History  . Not on file  Tobacco Use  . Smoking status: Former Smoker    Quit date: 04/09/2013    Years since quitting: 7.3  . Smokeless tobacco: Never Used  Vaping Use  . Vaping Use: Never used  Substance and Sexual Activity  . Alcohol use: Not Currently    Comment: 2x/month, none during pregnacy  . Drug use: No  . Sexual activity: Yes    Birth control/protection: Pill  Other Topics Concern  . Not on file  Social History Narrative  . Not  on file   Social Determinants of Health   Financial Resource Strain: Not on file  Food Insecurity: Not on file  Transportation Needs: Not on file  Physical Activity: Not on file  Stress: Not on file  Social Connections: Not on file  Intimate Partner Violence: Not on file   Any pets in the household: no  Allergy: Allergies  Allergen Reactions  . Oxycodone-Acetaminophen Hives and Itching  . Tape     Some tapes cause blisters  . Fluticasone Swelling    Mouth swelling    Current Outpatient Medications:  Current Outpatient Medications:  .  metoCLOPramide (REGLAN) 10 MG tablet, Take 1 tablet (10 mg total) by mouth 3 (three) times daily before meals., Disp: 90 tablet, Rfl: 4 .  ondansetron (ZOFRAN ODT) 4 MG disintegrating tablet, Take 1 tablet (4 mg total) by mouth every 6 (six) hours as needed for nausea., Disp: 20 tablet, Rfl: 0 .  vitamin B-6 (PYRIDOXINE) 25 MG tablet, Take 1 tablet (25 mg total) by mouth 3 (three) times daily before meals., Disp: 90 tablet, Rfl: 4   Physical Exam:   BP 120/80   Wt 266 lb (120.7 kg)   LMP 06/14/2020   BMI 44.26 kg/m  Body mass index is 44.26 kg/m. Constitutional: Well nourished, well developed female in no acute distress.  Neck:  Supple, normal appearance, and no thyromegaly  Cardiovascular: S1, S2 normal, no murmur, rub or gallop, regular rate and rhythm Respiratory:  Clear to auscultation bilateral. Normal respiratory effort Abdomen: positive bowel sounds and no masses, hernias; diffusely non tender to palpation, non distended Breasts: breasts appear normal, no suspicious masses, no skin or nipple changes or axillary nodes. Neuro/Psych:  Normal mood and affect.  Skin:  Warm and dry.  Lymphatic:  No inguinal lymphadenopathy.   Pelvic exam: is not limited by body habitus EGBUS: within normal limits, Vagina: within normal limits and with minimal blood in the vault on touch of cervix (contact bleeding), Cervix: normal appearing cervix  without discharge or lesions, closed/long/high, Uterus:  enlarged: 8 weeks, and Adnexa:  normal adnexa and no mass, fullness, tenderness  Assessment: Ms. Bleecker is a 30 y.o. C5E5277 [redacted]w[redacted]d based on [place] today (not c/w LMP) with an Estimated Date of Delivery: 04/06/21,  for prenatal care.    ICD-10-CM   1. [redacted] weeks gestation of pregnancy  Z3A.01   2. Bicornuate uterus  Q51.3   3. ASCUS with positive high risk HPV cervical  R87.610 Cytology - PAP   R87.810   4. Maternal morbid obesity, antepartum, first trimester (HCC)  O99.211    E66.01  5. High-risk pregnancy, first trimester  O09.91   6. Irregular menstrual cycle  N92.6   7. Encounter for genetic screening for Down Syndrome  Z13.79 MaterniT21 PLUS Core+SCA  8. Prenatal screening encounter  Z36.9 RPR+Rh+ABO+Rub Ab+Ab Scr+CB.Marland KitchenMarland Kitchen    Urine Culture    Glucose, fasting  9. History of gastric bypass  Z98.84 Glucose, fasting  10. History of 3 cesarean sections  Z98.891     Plan:  1) Avoid alcoholic beverages. 2) Patient encouraged not to smoke.  3) Discontinue the use of all non-medicinal drugs and chemicals.  4) Take prenatal vitamins daily.  5) Seatbelt use advised 6) Nutrition, food safety (fish, cheese advisories, and high nitrite foods) and exercise discussed. 7) Hospital and practice style delivering at [facility] discussed  8) Patient is asked about travel to areas at risk for the Zika virus, and counseled to avoid travel and exposure to mosquitoes or sexual partners who may have themselves been exposed to the virus. Testing is discussed, and will be ordered as appropriate.  9) Childbirth classes at [facility] advised 10) Genetic Screening, such as with 1st Trimester Screening, cell free fetal DNA, AFP testing, and Ultrasound, as well as with amniocentesis and CVS as appropriate, is discussed with patient. She plans to have genetic testing this pregnancy.  Plan labs at 10 weeks (fasting glucose, PNL, NIPT) [place] today, see report Meds for nausea  discussed, Rx Pt has new fiancee (FOB of other children passed away in a work accident 2 years ago) and this is his first pregnancy/child Pt does not desire BTL Plan for R CS at term RIsk factors related to obesity, prior gastric bypass surgery, and bicornuate uterus d/w pt   Problem list reviewed and updated.  Annamarie Major, MD, [facility], [facility] 08/17/2020  1:58 PM

## 2020-08-17 NOTE — Patient Instructions (Addendum)
Due Date is 04/06/2021 !  Commonly Asked Questions During Pregnancy  Cats: A parasite can be excreted in cat feces.  To avoid exposure you need to have another person empty the little box.  If you must empty the litter box you will need to wear gloves.  Wash your hands after handling your cat.  This parasite can also be found in raw or undercooked meat so this should also be avoided.  Colds, Sore Throats, Flu: Please check your medication sheet to see what you can take for symptoms.  If your symptoms are unrelieved by these medications please call the office.  Dental Work: Most any dental work Agricultural consultant recommends is permitted.  X-rays should only be taken during the first trimester if absolutely necessary.  Your abdomen should be shielded with a lead apron during all x-rays.  Please notify your provider prior to receiving any x-rays.  Novocaine is fine; gas is not recommended.  If your dentist requires a note from [place] prior to dental work please call the office and we will provide one for you.  Exercise: Exercise is an important part of staying healthy during your pregnancy.  You may continue most exercises you were accustomed to prior to pregnancy.  Later in your pregnancy you will most likely notice you have difficulty with activities requiring balance like riding a bicycle.  It is important that you listen to your body and avoid activities that put you at a higher risk of falling.  Adequate rest and staying well hydrated are a must!  If you have questions about the safety of specific activities ask your provider.    Exposure to Children with illness: Try to avoid obvious exposure; report any symptoms to [place] when noted,  If you have chicken pos, red measles or mumps, you should be immune to these diseases.   Please do not take any vaccines while pregnant unless you have checked with your OB provider.  Fetal Movement: After 28 weeks we recommend you do "kick counts" twice daily.  Lie or sit down in  a calm quiet environment and count your baby movements "kicks".  You should feel your baby at least 10 times per hour.  If you have not felt 10 kicks within the first hour get up, walk around and have something sweet to eat or drink then repeat for an additional hour.  If count remains less than 10 per hour notify your provider.  Fumigating: Follow your pest control agent's advice as to how long to stay out of your home.  Ventilate the area well before re-entering.  Hemorrhoids:   Most over-the-counter preparations can be used during pregnancy.  Check your medication to see what is safe to use.  It is important to use a stool softener or fiber in your diet and to drink lots of liquids.  If hemorrhoids seem to be getting worse please call the office.   Hot Tubs:  Hot tubs Jacuzzis and saunas are not recommended while pregnant.  These increase your internal body temperature and should be avoided.  Intercourse:  Sexual intercourse is safe during pregnancy as long as you are comfortable, unless otherwise advised by your provider.  Spotting may occur after intercourse; report any bright red bleeding that is heavier than spotting.  Labor:  If you know that you are in labor, please go to the hospital.  If you are unsure, please call the office and let us help you decide what to do.  Lifting, straining, etc:  If your job requires heavy lifting or straining please check with your provider for any limitations.  Generally, you should not lift items heavier than that you can lift simply with your hands and arms (no back muscles)  Painting:  Paint fumes do not harm your pregnancy, but may make you ill and should be avoided if possible.  Latex or water based paints have less odor than oils.  Use adequate ventilation while painting.  Permanents & Hair Color:  Chemicals in hair dyes are not recommended as they cause increase hair dryness which can increase hair loss during pregnancy.  " Highlighting" and permanents  are allowed.  Dye may be absorbed differently and permanents may not hold as well during pregnancy.  Sunbathing:  Use a sunscreen, as skin burns easily during pregnancy.  Drink plenty of fluids; avoid over heating.  Tanning Beds:  Because their possible side effects are still unknown, tanning beds are not recommended.  Ultrasound Scans:  Routine ultrasounds are performed at approximately 20 weeks.  You will be able to see your baby's general anatomy an if you would like to know the gender this can usually be determined as well.  If it is questionable when you conceived you may also receive an ultrasound early in your pregnancy for dating purposes.  Otherwise ultrasound exams are not routinely performed unless there is a medical necessity.  Although you can request a scan we ask that you pay for it when conducted because insurance does not cover " patient request" scans.  Work: If your pregnancy proceeds without complications you may work until your due date, unless your physician or employer advises otherwise.  Round Ligament Pain/Pelvic Discomfort:  Sharp, shooting pains not associated with bleeding are fairly common, usually occurring in the second trimester of pregnancy.  They tend to be worse when standing up or when you remain standing for long periods of time.  These are the result of pressure of certain pelvic ligaments called "round ligaments".  Rest, Tylenol and heat seem to be the most effective relief.  As the womb and fetus grow, they rise out of the pelvis and the discomfort improves.  Please notify the office if your pain seems different than that described.  It may represent a more serious condition.

## 2020-08-17 NOTE — Progress Notes (Signed)
ULTRASOUND REPORT  Location: [facility] Date of Service: 08/17/2020   Indications:Unsure LMP Findings:  Mason Jim intrauterine pregnancy is visualized with a CRL consistent with [redacted]w[redacted]d gestation, giving an (U/S) EDD of 04/06/2021. The (U/S) EDD is not consistent with the clinically established EDD of 03/21/21.  FHR: 150 BPM CRL measurement: 8.2 mm Yolk sac is visualized and appears normal. Amnion: visualized and appears normal   Right Ovary is normal in appearance. Left Ovary is normal appearance. Difficult to recognize bicornuate uterus on todays scan   There is no second gest sac  Corpus luteal cyst:  is not visualized Survey of the adnexa demonstrates no adnexal masses. There is no free peritoneal fluid in the cul de sac.  Impression: 1. [redacted]w[redacted]d Viable Singleton Intrauterine pregnancy by U/S. 2. (U/S) EDD is consistent with Clinically established EDD of 04/06/2021.  Recommendations: 1.Clinical correlation with the patient's History and Physical Exam.  Letitia Libra, MD

## 2020-08-20 LAB — URINE CULTURE

## 2020-08-22 LAB — CYTOLOGY - PAP
Chlamydia: NEGATIVE
Comment: NEGATIVE
Comment: NORMAL
Diagnosis: NEGATIVE
Neisseria Gonorrhea: NEGATIVE

## 2020-08-22 NOTE — Telephone Encounter (Signed)
Pap is normal right?

## 2020-08-24 ENCOUNTER — Other Ambulatory Visit: Payer: Self-pay

## 2020-08-24 ENCOUNTER — Other Ambulatory Visit: Payer: Self-pay | Admitting: Obstetrics

## 2020-08-24 DIAGNOSIS — O219 Vomiting of pregnancy, unspecified: Secondary | ICD-10-CM

## 2020-08-24 MED ORDER — ONDANSETRON 4 MG PO TBDP: 4.0000 mg | ORAL_TABLET | Freq: Four times a day (QID) | ORAL | 0 refills | Status: DC | PRN

## 2020-09-05 ENCOUNTER — Other Ambulatory Visit: Payer: Self-pay

## 2020-09-05 ENCOUNTER — Encounter: Payer: Self-pay | Admitting: Intensive Care

## 2020-09-05 ENCOUNTER — Emergency Department
Admission: EM | Admit: 2020-09-05 | Discharge: 2020-09-05 | Disposition: A | Discharge status: Home / Self Care | Payer: Medicaid Other | Attending: Emergency Medicine | Admitting: Emergency Medicine

## 2020-09-05 DIAGNOSIS — Z3A01 Less than 8 weeks gestation of pregnancy: Secondary | ICD-10-CM | POA: Insufficient documentation

## 2020-09-05 DIAGNOSIS — O219 Vomiting of pregnancy, unspecified: Secondary | ICD-10-CM | POA: Diagnosis not present

## 2020-09-05 DIAGNOSIS — R110 Nausea: Secondary | ICD-10-CM

## 2020-09-05 DIAGNOSIS — Z87891 Personal history of nicotine dependence: Secondary | ICD-10-CM | POA: Insufficient documentation

## 2020-09-05 LAB — COMPREHENSIVE METABOLIC PANEL
ALT: 28 U/L (ref 0–44)
AST: 22 U/L (ref 15–41)
Albumin: 3.7 g/dL (ref 3.5–5.0)
Alkaline Phosphatase: 79 U/L (ref 38–126)
Anion gap: 11 (ref 5–15)
BUN: 7 mg/dL (ref 6–20)
CO2: 20 mmol/L — ABNORMAL LOW (ref 22–32)
Calcium: 8.9 mg/dL (ref 8.9–10.3)
Chloride: 103 mmol/L (ref 98–111)
Creatinine, Ser: 0.47 mg/dL (ref 0.44–1.00)
GFR, Estimated: >60 mL/min (ref >60)
Glucose, Bld: 92 mg/dL (ref 70–99)
Potassium: 3.6 mmol/L (ref 3.5–5.1)
Sodium: 134 mmol/L — ABNORMAL LOW (ref 135–145)
Total Bilirubin: 1.5 mg/dL — ABNORMAL HIGH (ref 0.3–1.2)
Total Protein: 6.5 g/dL (ref 6.5–8.1)

## 2020-09-05 LAB — CBC WITH DIFFERENTIAL/PLATELET
Abs Immature Granulocytes: 0.02 10*3/uL (ref 0.00–0.07)
Basophils Absolute: 0 10*3/uL (ref 0.0–0.1)
Basophils Relative: 1 %
Eosinophils Absolute: 0.1 10*3/uL (ref 0.0–0.5)
Eosinophils Relative: 1 %
HCT: 39 % (ref 36.0–46.0)
Hemoglobin: 14.6 g/dL (ref 12.0–15.0)
Immature Granulocytes: 0 %
Lymphocytes Relative: 19 %
Lymphs Abs: 1.2 10*3/uL (ref 0.7–4.0)
MCH: 32.4 pg (ref 26.0–34.0)
MCHC: 37.4 g/dL — ABNORMAL HIGH (ref 30.0–36.0)
MCV: 86.5 fL (ref 80.0–100.0)
Monocytes Absolute: 0.4 10*3/uL (ref 0.1–1.0)
Monocytes Relative: 6 %
Neutro Abs: 4.6 10*3/uL (ref 1.7–7.7)
Neutrophils Relative %: 73 %
Platelets: 171 10*3/uL (ref 150–400)
RBC: 4.51 MIL/uL (ref 3.87–5.11)
RDW: 12.8 % (ref 11.5–15.5)
WBC: 6.4 10*3/uL (ref 4.0–10.5)
nRBC: 0 % (ref 0.0–0.2)

## 2020-09-05 LAB — URINALYSIS, COMPLETE (UACMP) WITH MICROSCOPIC
Bilirubin Urine: NEGATIVE
Glucose, UA: NEGATIVE mg/dL
Hgb urine dipstick: NEGATIVE
Ketones, ur: 80 mg/dL — AB
Nitrite: NEGATIVE
Protein, ur: 30 mg/dL — AB
Specific Gravity, Urine: 1.028 (ref 1.005–1.030)
pH: 5 (ref 5.0–8.0)

## 2020-09-05 MED ORDER — LACTATED RINGERS IV BOLUS
1000.0000 mL | Freq: Once | INTRAVENOUS | Status: AC
  Administered 2020-09-05: 1000 mL via INTRAVENOUS

## 2020-09-05 MED ORDER — METOCLOPRAMIDE HCL 5 MG/ML IJ SOLN
10.0000 mg | Freq: Once | INTRAMUSCULAR | Status: AC
  Administered 2020-09-05: 10 mg via INTRAVENOUS
  Filled 2020-09-05: qty 2

## 2020-09-05 MED ORDER — ONDANSETRON HCL 4 MG/2ML IJ SOLN
4.0000 mg | Freq: Once | INTRAMUSCULAR | Status: AC
  Administered 2020-09-05: 4 mg via INTRAVENOUS
  Filled 2020-09-05: qty 2

## 2020-09-05 NOTE — ED Notes (Signed)
Pt given water for fluid challenge 

## 2020-09-05 NOTE — ED Notes (Signed)
Pt able to tolerate fluid challenge. NP made aware

## 2020-09-05 NOTE — Discharge Instructions (Signed)
The medication that seemed to help today was Reglan (metoclopramide). You were prescribed this on 5/11. Please try taking it 3 times per day even if you do not feel nauseated. Wait 30 minutes after taking it before trying to eat.  Try drinking Pedialyte at first, then progress as tolerated. Try warm broth this evening.  You may need to work with a dietician to develop a more inclusive plan to get all the vitamins needed while you are pregnant.

## 2020-09-05 NOTE — ED Notes (Addendum)
Pt assisted to the bathroom at this time.

## 2020-09-05 NOTE — ED Triage Notes (Signed)
Pt here for N/V during pregnancy. Reports [redacted]weeks pregnant. Was sent here by MD for fluids bc patient reports she only drinks half a bottle of water a day and 4 shrimp in the past 3 days.

## 2020-09-05 NOTE — ED Provider Notes (Signed)
[facility] Emergency Department Provider Note ____________________________________________   Event Date/Time   First MD Initiated Contact with Patient 09/05/20 1211     (approximate)  I have reviewed the triage vital signs and the nursing notes.   HISTORY  Chief Complaint Emesis During Pregnancy  HPI Sandy [name] is a 30 y.o. female with history of gastric bypass 3 months ago presents to the emergency department for treatment and evaluation of extreme nausea.  Patient is approximately [redacted] weeks pregnant.  She has not been able to tolerate food or fluids due to the nausea.  Because of her gastric bypass procedure, she is unable to vomit but states that she feels that if she could she would feel better.  She denies fever, diarrhea, constipation, or other symptoms of concern. She has tried all replacement drinks, high protein powders, and zero sugar juices. She is only able to tolerate a very small amount and the nausea gets so bad she just can't swallow any more.        Past Medical History:  Diagnosis Date  . Bicornate uterus   . Complication of anesthesia    itching after C-Sections  . Family history of adverse reaction to anesthesia    uncle has a hard time waking up  . Genital HSV   . GERD (gastroesophageal reflux disease)   . Migraine   . Obesity affecting pregnancy    BMI>40    Patient Active Problem List   Diagnosis Date Noted  . Maternal morbid obesity, antepartum, first trimester (HCC) 08/17/2020  . ASCUS with positive high risk HPV cervical 08/17/2020  . High-risk pregnancy, first trimester 08/17/2020  . History of gastric bypass 08/17/2020  . History of 3 cesarean sections 08/17/2020  . Morbid obesity (HCC) 03/09/2020  . CVA (cerebral vascular accident) (HCC) 10/09/2019  . 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 CESAREAN SECTION   2012   for HSV outbreak  . CESAREAN SECTION N/A 11/29/2015   Procedure: CESAREAN SECTION Baby Girl 7lb.[name];  Surgeon: Vena Austria, MD;  Location: [facility] ORS;  Service: Obstetrics;  Laterality: N/A;  . CESAREAN SECTION N/A 12/18/2016   Procedure: CESAREAN SECTION;  Surgeon: Conard Novak, MD;  Location: [facility] ORS;  Service: Obstetrics;  Laterality: N/A;  . ears Bilateral    tubes  . ESOPHAGOGASTRODUODENOSCOPY (EGD) WITH PROPOFOL N/A 05/04/2016   Procedure: ESOPHAGOGASTRODUODENOSCOPY (EGD) WITH PROPOFOL;  Surgeon: Midge Minium, MD;  Location: [facility];  Service: Endoscopy;  Laterality: N/A;  . HERNIA REPAIR  06/18/2019  . STENT PLACEMENT ILIAC ([facility] HX)    . TONSILLECTOMY      Prior to Admission medications   Medication Sig Start Date End Date Taking? Authorizing Provider  metoCLOPramide (REGLAN) 10 MG tablet Take 1 tablet (10 mg total) by mouth 3 (three) times daily before meals. 08/17/20   Nadara Mustard, MD  ondansetron (ZOFRAN ODT) 4 MG disintegrating tablet Take 1 tablet (4 mg total) by mouth every 6 (six) hours as needed for nausea. 08/24/20   Mirna Mires, CNM  ondansetron (ZOFRAN ODT) 4 MG disintegrating tablet Take 1 tablet (4 mg total) by mouth every 6 (six) hours as needed for nausea. 08/24/20   Mirna Mires, CNM  vitamin B-6 (PYRIDOXINE) 25 MG tablet Take 1 tablet (25 mg total) by mouth 3 (three) times daily before meals. 08/17/20   Nadara Mustard,  MD    Allergies Oxycodone-acetaminophen, Tape, and Fluticasone  Family History  Problem Relation Age of Onset  . Diabetes Maternal Aunt   . Hyperlipidemia Maternal Aunt     Social History Social History   Tobacco Use  . Smoking status: Former Smoker    Quit date: 04/09/2013    Years since quitting: 7.4  . Smokeless tobacco: Never Used  Vaping Use  . Vaping Use: Never used  Substance Use Topics  . Alcohol use: Not Currently    Comment: 2x/month, none during pregnacy  . Drug use: No    Review  of Systems  Constitutional: No fever/chills Eyes: No visual changes. ENT: No sore throat. Cardiovascular: Denies chest pain. Respiratory: Denies shortness of breath. Gastrointestinal: No abdominal pain.  Positive for nausea, no vomiting.  No diarrhea.  No constipation. Genitourinary: Negative for dysuria. Musculoskeletal: Negative for back pain. Skin: Negative for rash. Neurological: Negative for headaches, focal weakness or numbness. ____________________________________________   PHYSICAL EXAM:  VITAL SIGNS: ED Triage Vitals  Enc Vitals Group     BP 09/05/20 1200 131/76     Pulse Rate 09/05/20 1200 87     Resp 09/05/20 1200 16     Temp 09/05/20 1200 99.1 F (37.3 C)     Temp Source 09/05/20 1200 Oral     SpO2 09/05/20 1200 100 %     Weight 09/05/20 1201 252 lb (114.3 kg)     Height 09/05/20 1201 5' 5.5" (1.664 m)     Head Circumference --      Peak Flow --      Pain Score 09/05/20 1201 0     Pain Loc --      Pain Edu? --      Excl. in GC? --     Constitutional: Alert and oriented. Well appearing and in no acute distress. Eyes: Conjunctivae are normal. PERRL. EOMI. Head: Atraumatic. Nose: No congestion/rhinnorhea. Mouth/Throat: Mucous membranes are moist.  Oropharynx non-erythematous. Neck: No stridor.   Hematological/Lymphatic/Immunilogical: No cervical lymphadenopathy. Cardiovascular: Normal rate, regular rhythm. Grossly normal heart sounds.  Good peripheral circulation. Respiratory: Normal respiratory effort.  No retractions. Lungs CTAB. Gastrointestinal: Soft and nontender. No distention. No abdominal bruits. Genitourinary:  Musculoskeletal: No lower extremity tenderness nor edema.  No joint effusions. Neurologic:  Normal speech and language. No gross focal neurologic deficits are appreciated. No gait instability. Skin:  Skin is warm, dry and intact. No rash noted. Psychiatric: Mood and affect are normal. Speech and behavior are  normal.  ____________________________________________   LABS (all labs ordered are listed, but only abnormal results are displayed)  Labs Reviewed  COMPREHENSIVE METABOLIC PANEL - Abnormal; Notable for the following components:      Result Value   Sodium 134 (*)    CO2 20 (*)    Total Bilirubin 1.5 (*)    All other components within normal limits  CBC WITH DIFFERENTIAL/PLATELET - Abnormal; Notable for the following components:   MCHC 37.4 (*)    All other components within normal limits  URINALYSIS, COMPLETE (UACMP) WITH MICROSCOPIC - Abnormal; Notable for the following components:   Color, Urine YELLOW (*)    APPearance HAZY (*)    Ketones, ur 80 (*)    Protein, ur 30 (*)    Leukocytes,Ua TRACE (*)    Bacteria, UA RARE (*)    All other components within normal limits   ____________________________________________  EKG  Not indicated. ____________________________________________  RADIOLOGY  ED MD interpretation:    Not indicated. I,  Kem Boroughs, personally viewed and evaluated these images (plain radiographs) as part of my medical decision making, as well as reviewing the written report by the radiologist.  Official radiology report(s): No results found.  ____________________________________________   PROCEDURES  Procedure(s) performed (including Critical Care):  Procedures  ____________________________________________   INITIAL IMPRESSION / ASSESSMENT AND PLAN     30 year old female presenting to the emergency department for treatment and evaluation of an severe nausea while approximately [redacted] weeks pregnant.  See HPI for further details.  Plan will be to get some labs, urinalysis, and give some fluids in addition to some IV Zofran.  DIFFERENTIAL DIAGNOSIS  Nausea in pregnancy, dehydration  ED COURSE  IV fluids used.  Zofran did not seem to help.  Reglan given IV with some relief of symptoms.  Patient able to tolerate 1 full cup of water without  extreme nausea.  Does appear that she had been prescribed Reglan recently and was encouraged to take it 3 times a day even if she does not feel nauseated.  She was also advised to start with Pedialyte and broth and progress as tolerated.  She will likely need to discuss a meal plan with either the bariatric surgeon or dietitian while pregnant.  She was advised to follow up with gynecologist or return to the ER for symptoms that change or worsen.    ___________________________________________   FINAL CLINICAL IMPRESSION(S) / ED DIAGNOSES  Final diagnoses:  Nausea without vomiting     ED Discharge Orders    None       Sandy [name] was evaluated in Emergency Department on 09/05/2020 for the symptoms described in the history of present illness. She was evaluated in the context of the global COVID-19 pandemic, which necessitated consideration that the patient might be at risk for infection with the SARS-CoV-2 virus that causes COVID-19. Institutional protocols and algorithms that pertain to the evaluation of patients at risk for COVID-19 are in a state of rapid change based on information released by regulatory bodies including the CDC and federal and state organizations. These policies and algorithms were followed during the patient's care in the ED.   Note:  This document was prepared using Dragon voice recognition software and may include unintentional dictation errors.   [name], FNP 09/05/20 Elfredia Nevins    Jene Every, MD 09/06/20 1247

## 2020-09-06 ENCOUNTER — Other Ambulatory Visit: Payer: Self-pay | Admitting: Obstetrics & Gynecology

## 2020-09-06 MED ORDER — DOXYLAMINE-PYRIDOXINE 10-10 MG PO TBEC: 2.0000 | DELAYED_RELEASE_TABLET | Freq: Every day | ORAL | 5 refills | Status: DC

## 2020-09-06 NOTE — Telephone Encounter (Signed)
Needs appt work in any OB (CNM) to assess

## 2020-09-06 NOTE — Telephone Encounter (Signed)
Is there anything else she can try?

## 2020-09-08 ENCOUNTER — Other Ambulatory Visit: Payer: Self-pay

## 2020-09-08 ENCOUNTER — Ambulatory Visit (INDEPENDENT_AMBULATORY_CARE_PROVIDER_SITE_OTHER): Payer: Medicaid Other | Admitting: Obstetrics and Gynecology

## 2020-09-08 VITALS — BP 120/70 | Ht 65.5 in | Wt 252.0 lb

## 2020-09-08 DIAGNOSIS — Z3A10 10 weeks gestation of pregnancy: Secondary | ICD-10-CM

## 2020-09-08 DIAGNOSIS — Z1379 Encounter for other screening for genetic and chromosomal anomalies: Secondary | ICD-10-CM

## 2020-09-08 DIAGNOSIS — O0991 Supervision of high risk pregnancy, unspecified, first trimester: Secondary | ICD-10-CM

## 2020-09-08 LAB — POCT URINALYSIS DIPSTICK OB
Glucose, UA: NEGATIVE
POC,PROTEIN,UA: NEGATIVE

## 2020-09-08 NOTE — Patient Instructions (Addendum)
Extra labs to order and check vitamin levels during pregnancy:               First Trimester of Pregnancy  The first trimester of pregnancy starts on the first day of your last menstrual period until the end of week 12. This is months 1 through 3 of pregnancy. A week after a sperm fertilizes an egg, the egg will implant into the wall of the uterus and begin to develop into a baby. By the end of 12 weeks, all the baby's organs will be formed and the baby will be 2-3 inches in size. Body changes during your first trimester Your body goes through many changes during pregnancy. The changes vary and generally return to normal after your baby is born. Physical changes  You may gain or lose weight.  Your breasts may begin to grow larger and become tender. The tissue that surrounds your nipples (areola) may become darker.  Dark spots or blotches (chloasma or mask of pregnancy) may develop on your face.  You may have changes in your hair. These can include thickening or thinning of your hair or changes in texture. Health changes  You may feel nauseous, and you may vomit.  You may have heartburn.  You may develop headaches.  You may develop constipation.  Your gums may bleed and may be sensitive to brushing and flossing. Other changes  You may tire easily.  You may urinate more often.  Your menstrual periods will stop.  You may have a loss of appetite.  You may develop cravings for certain kinds of food.  You may have changes in your emotions from day to day.  You may have more vivid and strange dreams. Follow these instructions at home: Medicines  Follow your health care provider's instructions regarding medicine use. Specific medicines may be either safe or unsafe to take during pregnancy. Do not take any medicines unless told to by your health care provider.  Take a prenatal vitamin that contains at least 600 micrograms (mcg) of folic acid. Eating and  drinking  Eat a healthy diet that includes fresh fruits and vegetables, whole grains, good sources of protein such as meat, eggs, or tofu, and low-fat dairy products.  Avoid raw meat and unpasteurized juice, milk, and cheese. These carry germs that can harm you and your baby.  If you feel nauseous or you vomit: ? Eat 4 or 5 small meals a day instead of 3 large meals. ? Try eating a few soda crackers. ? Drink liquids between meals instead of during meals.  You may need to take these actions to prevent or treat constipation: ? Drink enough fluid to keep your urine pale yellow. ? Eat foods that are high in fiber, such as beans, whole grains, and fresh fruits and vegetables. ? Limit foods that are high in fat and processed sugars, such as fried or sweet foods. Activity  Exercise only as directed by your health care provider. Most people can continue their usual exercise routine during pregnancy. Try to exercise for 30 minutes at least 5 days a week.  Stop exercising if you develop pain or cramping in the lower abdomen or lower back.  Avoid exercising if it is very hot or humid or if you are at high altitude.  Avoid heavy lifting.  If you choose to, you may have sex unless your health care provider tells you not to. Relieving pain and discomfort  Wear a good support bra to relieve  breast tenderness.  Rest with your legs elevated if you have leg cramps or low back pain.  If you develop bulging veins (varicose veins) in your legs: ? Wear support hose as told by your health care provider. ? Elevate your feet for 15 minutes, 3-4 times a day. ? Limit salt in your diet. Safety  Wear your seat belt at all times when driving or riding in a car.  Talk with your health care provider if someone is verbally or physically abusive to you.  Talk with your health care provider if you are feeling sad or have thoughts of hurting yourself. Lifestyle  Do not use hot tubs, steam rooms, or  saunas.  Do not douche. Do not use tampons or scented sanitary pads.  Do not use herbal remedies, alcohol, illegal drugs, or medicines that are not approved by your health care provider. Chemicals in these products can harm your baby.  Do not use any products that contain nicotine or tobacco, such as cigarettes, e-cigarettes, and chewing tobacco. If you need help quitting, ask your health care provider.  Avoid cat litter boxes and soil used by cats. These carry germs that can cause birth defects in the baby and possibly loss of the unborn baby (fetus) by miscarriage or stillbirth. General instructions  During routine prenatal visits in the first trimester, your health care provider will do a physical exam, perform necessary tests, and ask you how things are going. Keep all follow-up visits. This is important.  Ask for help if you have counseling or nutritional needs during pregnancy. Your health care provider can offer advice or refer you to specialists for help with various needs.  Schedule a dentist appointment. At home, brush your teeth with a soft toothbrush. Floss gently.  Write down your questions. Take them to your prenatal visits. Where to find more information  American Pregnancy Association: americanpregnancy.org  [employer]: https://www.todd-brady.net/  Office on [employer] [facility]: MightyReward.co.nz Contact a health care provider if you have:  Dizziness.  A fever.  Mild pelvic cramps, pelvic pressure, or nagging pain in the abdominal area.  Nausea, vomiting, or diarrhea that lasts for 24 hours or longer.  A bad-smelling vaginal discharge.  Pain when you urinate.  Known exposure to a contagious illness, such as chickenpox, measles, Zika virus, HIV, or hepatitis. Get help right away if you have:  Spotting or bleeding from your vagina.  Severe abdominal cramping or pain.  Shortness of breath or chest  pain.  Any kind of trauma, such as from a fall or a car crash.  New or increased pain, swelling, or redness in an arm or leg. Summary  The first trimester of pregnancy starts on the first day of your last menstrual period until the end of week 12 (months 1 through 3).  Eating 4 or 5 small meals a day rather than 3 large meals may help to relieve nausea and vomiting.  Do not use any products that contain nicotine or tobacco, such as cigarettes, e-cigarettes, and chewing tobacco. If you need help quitting, ask your health care provider.  Keep all follow-up visits. This is important. This information is not intended to replace advice given to you by your health care provider. Make sure you discuss any questions you have with your health care provider. Document Revised: 09/02/2019 Document Reviewed: 07/09/2019 Elsevier Patient Education  2021 Elsevier Inc.   Initial steps to help :   B6 (pyridoxine) 25 mg,  3-4 times a day-  200 mg a day total Unisom (doxylamine) 25 mg at bedtime **B6 and Unisom are available as a combination prescription medications called diclegis and bonjesta  B1 (thiamin)  50-100 mg 1-2 a day-  100 mg a day total  Continue prenatal vitamin with iron and thiamin. If it is not tolerated switch to 1 mg of folic acid.  Can add medication for gastric reflux if needed.  Subsequent steps to be added to B1, B6, and Unisom:  1. Antihistamine (one of the following medications) Dramamine      25-50 mg every 4-6 hours Benadryl      25-50 mg every 4-6 hours Meclizine      25 mg every 6 hours  2. Dopamine Antagonist (one of the following medications) Metoclopramide  (Reglan)  5-10 mg every 6-8 hours         PO Promethazine   (Phenergan)   12.5-25 mg every 4-6 hours      PO or rectal Prochlorperazine  (Compazine)  5-10 mg every 6-8 hours     25mg  BID rectally   Subsequent steps if there has still not been improvement in symptoms:  3. Daily stool softner:  Colace 100 mg  twice a day  4. Ondansetron  (Zofran)   4-8 mg every 6-8 hours     Hyperemesis Gravidarum Hyperemesis gravidarum is a severe form of nausea and vomiting that happens during pregnancy. Hyperemesis is worse than morning sickness. It may cause you to have nausea or vomiting all day for many days. It may keep you from eating and drinking enough food and liquids, which can lead to dehydration, malnutrition, and weight loss. Hyperemesis usually occurs during the first half (the first 20 weeks) of pregnancy. It often goes away once a woman is in her second half of pregnancy. However, sometimes hyperemesis continues through an entire pregnancy. What are the causes? The cause of this condition is not known. It may be associated with:  Changes in hormones in the body during pregnancy.  Changes in the gastrointestinal system.  Genetic or inherited conditions. What are the signs or symptoms? Symptoms of this condition include:  Severe nausea and vomiting that does not go away.  Problems keeping food down.  Weight loss.  Loss of body fluid (dehydration).  Loss of appetite. You may have no desire to eat or you may not like the food you have previously enjoyed. How is this diagnosed? This condition may be diagnosed based on your medical history, your symptoms, and a physical exam. You may also have other tests, including:  Blood tests.  Urine tests.  Blood pressure tests.  Ultrasound to look for problems with the placenta or to check if you are pregnant with more than one baby. How is this treated? This condition is managed by controlling symptoms. This may include:  Following an eating plan. This can help to lessen nausea and vomiting.  Treatments that do not use medicine. These include acupressure bracelets, hypnosis, and eating or drinking foods or fluids that contain ginger, ginger ale, or ginger tea.  Taking prescription medicine or over-the-counter medicine as told by your health  care provider.  Continuing to take prenatal vitamins. You may need to change what kind you take and when you take them. Follow your health care provider's instructions about prenatal vitamins. An eating plan and medicines are often used together to help control symptoms. If medicines do not help relieve nausea and vomiting, you may need to receive fluids through an IV at the  hospital. Follow these instructions at home: To help relieve your symptoms, listen to your body. Everyone is different and has different preferences. Find what works best for you. Here are some things you can try to help relieve your symptoms: Meals and snacks  Eat 5-6 small meals daily instead of 3 large meals. Eating small meals and snacks can help you avoid an empty stomach.  Before getting out of bed, eat a couple of crackers to avoid moving around on an empty stomach.  Eat a protein-rich snack before bed. Examples include cheese and crackers, or a peanut butter sandwich made with 1 slice of whole-wheat bread and 1 tsp (5 g) of peanut butter.  Eat and drink slowly.  Try eating starchy foods as these are usually tolerated well. Examples include cereal, toast, bread, potatoes, pasta, rice, and pretzels.  Eat at least one serving of protein with your meals and snacks. Protein options include lean meats, poultry, seafood, beans, nuts, nut butters, eggs, cheese, and yogurt.  Eat or suck on things that have ginger in them. It may help to relieve nausea. Add  tsp (0.44 g) ground ginger to hot tea, or choose ginger tea.   Fluids It is important to stay hydrated. Try to:  Drink small amounts of fluids often.  Drink fluids 30 minutes before or after a meal to help lessen the feeling of a full stomach.  Drink 100% fruit juice or an electrolyte drink. An electrolyte drink contains sodium, potassium, and chloride.  Drink fluids that are cold, clear, and carbonated or sour. These include lemonade, ginger ale, lemon-lime  soda, ice water, and sparkling water. Things to avoid Avoid the following:  Eating foods that trigger your symptoms. These may include spicy foods, coffee, high-fat foods, very sweet foods, and acidic foods.  Drinking more than 1 cup of fluid at a time.  Skipping meals. Nausea can be more intense on an empty stomach. If you cannot tolerate food, do not force it. Try sucking on ice chips or other frozen items and make up for missed calories later.  Lying down within 2 hours after eating.  Being exposed to environmental triggers. These may include food smells, smoky rooms, closed spaces, rooms with strong smells, warm or humid places, overly loud and noisy rooms, and rooms with motion or flickering lights. Try eating meals in a well-ventilated area that is free of strong smells.  Making quick and sudden changes in your movement.  Taking iron pills and multivitamins that contain iron. If you take prescription iron pills, do not stop taking them unless your health care provider approves.  Preparing food. The smell of food can spoil your appetite or trigger nausea. General instructions  Brush your teeth or use a mouth rinse after meals.  Take over-the-counter and prescription medicines only as told by your health care provider.  Follow instructions from your health care provider about eating or drinking restrictions.  Talk with your health care provider about starting a supplement of vitamin B6.  Continue to take your prenatal vitamins as told by your health care provider. If you are having trouble taking your prenatal vitamins, talk with your health care provider about other options.  Keep all follow-up visits. This is important. Follow-up visits include prenatal visits. Contact a health care provider if:  You have pain in your abdomen.  You have a severe headache.  You have vision problems.  You are losing weight.  You feel weak or dizzy.  You cannot eat or drink  without  vomiting, especially if this goes on for a full day. Get help right away if:  You cannot drink fluids without vomiting.  You vomit blood.  You have constant nausea and vomiting.  You are very weak.  You faint.  You have a fever and your symptoms suddenly get worse. Summary  Hyperemesis gravidarum is a severe form of nausea and vomiting that happens during pregnancy.  Making some changes to your eating habits may help relieve nausea and vomiting.  This condition may be managed with lifestyle changes and medicines as prescribed by your health care provider.  If medicines do not help relieve nausea and vomiting, you may need to receive fluids through an IV at the hospital. This information is not intended to replace advice given to you by your health care provider. Make sure you discuss any questions you have with your health care provider. Document Revised: 10/19/2019 Document Reviewed: 10/19/2019 Elsevier Patient Education  2021 [employer]

## 2020-09-08 NOTE — Progress Notes (Signed)
Routine Prenatal Care Visit  Subjective  Sandy [name] is a 30 y.o. [id number] at [redacted]w[redacted]d being seen today for ongoing prenatal care.  She is currently monitored for the following issues for this high-risk pregnancy and has HSV-2 (herpes simplex virus 2) infection; Bicornuate uterus; CVA (cerebral vascular accident) (HCC); Morbid obesity (HCC); Maternal morbid obesity, antepartum, first trimester (HCC); ASCUS with positive high risk HPV cervical; High-risk pregnancy, first trimester; History of gastric bypass; and History of 3 cesarean sections on their problem list.  ----------------------------------------------------------------------------------- Patient reports extreme nausea, recent hospital visit for dehydration. 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  Reports diclegis is helping her symptoms.  Contractions: Not present. Vag. Bleeding: None.  Movement: Absent. Denies leaking of fluid.  ----------------------------------------------------------------------------------- The following portions of the patient's history were reviewed and updated as appropriate: allergies, current medications, past family history, past medical history, past social history, past surgical history and problem list. Problem list updated.   Objective  Blood pressure 120/70, height 5' 5.5" (1.664 m), weight 252 lb (114.3 kg), last menstrual period 06/14/2020. Pregravid weight 288 lb (130.6 kg) Total Weight Gain -36 lb (-16.3 kg) Urinalysis:      Fetal Status: Fetal Heart Rate (bpm): 165   Movement: Absent     General:  Alert, oriented and cooperative. Patient is in no acute distress.  Skin: Skin is warm and dry. No rash noted.   Cardiovascular: Normal heart rate noted  Respiratory: Normal respiratory effort, no problems with respiration noted  Abdomen: Soft, gravid, appropriate for gestational age. Pain/Pressure: Absent     Pelvic:  Cervical exam deferred        Extremities: Normal range of  motion.     Mental Status: Normal mood and affect. Normal behavior. Normal judgment and thought content.     Assessment   30 y.o. [id number] at [redacted]w[redacted]d by  04/06/2021, by Ultrasound presenting for routine prenatal visit  Plan   pregnancy4  Problems (from 06/14/20 to present)    Problem Noted Resolved   Maternal morbid obesity, antepartum, first trimester (HCC) 08/17/2020 by Nadara Mustard, MD No   Overview Signed 08/17/2020  1:57 PM by Nadara Mustard, MD    BMI >=40 [x ] early fasting glc (no glucola due to recent gastric bypass)  [ ]  screen sleep apnea [ ]  anesthesia consult (early and late if BMI > 45) [ ]  u/s for dating [ ]   [x ] nutritional goals [x ] folic acid 1mg  [ ]  bASA (>12 weeks) [x ] consider nutrition consult [ ]  consider maternal EKG 1st trimester [ ]  Growth u/s 28 [ ] , 32 [ ] , 36 weeks [ ]  [ ]  NST/AFI weekly 34+ weeks (34[] ,35[] ,36[] , 37[] , 38[] )      High-risk pregnancy, first trimester 08/17/2020 by , MD No   Overview Signed 08/17/2020  1:54 PM by , MD    [facility]renatal [facility]  Dating 6 weeks Blood type:     Genetic Screen NIPS: Antibody:   Anatomic  Rubella:   Varicella: @VZVIGG @  Glucse random screenings Early:               Third trimester:  RPR:     Rhogam n/a HBsAg:     TDaP vaccine                       Flu Shot: HIV:      GBS:   Contraception no BTL Pap:08/2020  CBB     CS/VBAC CA needed   Support Person            History of gastric bypass 08/17/2020 by Nadara Mustard, MD No   Overview Signed 08/17/2020  1:53 PM by Nadara Mustard, MD    Vitamin use counseled Avoid glucola    Alternative screening for GDM discussed    Pt has never had GDM, thus is low risk          Provided patient with list of micronutrient for prenatal vitamins with history of gastric bypass Provided with a list of extra labs which can be collected with her planned prenatal labs next  week.  Bedside US today shows IUP with FHR 165 bpm. Patient provided with images.  Given medication treatment plan for hyperemesis.   Gestational age appropriate obstetric precautions including but not limited to vaginal bleeding, contractions, leaking of fluid and fetal movement were reviewed in detail with the patient.    Return for ROB as planned.  Natale Milch MD [facility], [facility] 09/08/2020, 8:44 AM

## 2020-09-14 ENCOUNTER — Other Ambulatory Visit: Payer: Self-pay

## 2020-09-14 ENCOUNTER — Ambulatory Visit (INDEPENDENT_AMBULATORY_CARE_PROVIDER_SITE_OTHER): Payer: Medicaid Other | Admitting: Obstetrics & Gynecology

## 2020-09-14 ENCOUNTER — Encounter: Payer: Self-pay | Admitting: Obstetrics & Gynecology

## 2020-09-14 VITALS — BP 120/80 | Wt 250.0 lb

## 2020-09-14 DIAGNOSIS — Z369 Encounter for antenatal screening, unspecified: Secondary | ICD-10-CM

## 2020-09-14 DIAGNOSIS — O99211 Obesity complicating pregnancy, first trimester: Secondary | ICD-10-CM

## 2020-09-14 DIAGNOSIS — Z3A10 10 weeks gestation of pregnancy: Secondary | ICD-10-CM

## 2020-09-14 DIAGNOSIS — O0991 Supervision of high risk pregnancy, unspecified, first trimester: Secondary | ICD-10-CM

## 2020-09-14 DIAGNOSIS — Z98891 History of uterine scar from previous surgery: Secondary | ICD-10-CM

## 2020-09-14 DIAGNOSIS — Z9884 Bariatric surgery status: Secondary | ICD-10-CM

## 2020-09-14 DIAGNOSIS — Q513 Bicornate uterus: Secondary | ICD-10-CM

## 2020-09-14 DIAGNOSIS — Z6841 Body Mass Index (BMI) 40.0 and over, adult: Secondary | ICD-10-CM

## 2020-09-14 DIAGNOSIS — Z1379 Encounter for other screening for genetic and chromosomal anomalies: Secondary | ICD-10-CM

## 2020-09-14 LAB — POCT URINALYSIS DIPSTICK OB
Glucose, UA: NEGATIVE
POC,PROTEIN,UA: NEGATIVE

## 2020-09-14 NOTE — Progress Notes (Signed)
  Subjective  Min nausea Min cramping at times  Objective  BP 120/80   Wt 250 lb (113.4 kg)   LMP 06/14/2020   BMI 40.97 kg/m  General: NAD Pumonary: no increased work of breathing Abdomen: gravid, non-tender Extremities: no edema Psychiatric: mood appropriate, affect full FHT by [place] - 150s  Assessment  30 y.o. [id number] at [redacted]w[redacted]d by  04/06/2021, by Ultrasound presenting for routine prenatal visit  Plan   Problem List Items Addressed This Visit      Genitourinary   Bicornuate uterus   Relevant Orders   [place] MFM OB COMP + 14 WK   Maternal morbid obesity, antepartum, first trimester (HCC)   Relevant Orders   [place] MFM OB COMP + 14 WK   Hemoglobin A1c   Ambulatory referral to Anesthesiology   High-risk pregnancy, first trimester - Primary   Relevant Orders   POC Urinalysis Dipstick OB (Completed)   History of gastric bypass   Relevant Orders   [place] MFM OB COMP + 14 WK   Vitamin B12   VITAMIN D 25 Hydroxy (Vit-D Deficiency, Fractures)   CMP and Liver   Folate   History of 3 cesarean sections    Plan R CS at term    Other Visit Diagnoses    Encounter for genetic screening for Down Syndrome       Relevant Orders   MaterniT21 PLUS Core+SCA   [redacted] weeks gestation of pregnancy    PNV    Diet discussed    Weight loss expected due to surgery, make sure adequate nutrition        BMI 40.0-44.9, adult [facility])       Relevant Orders   Ambulatory referral to Anesthesiology   Prenatal screening encounter       Relevant Orders   Hepatitis C antibody   CMP and Liver      pregnancy4  Problems (from 06/14/20 to present)    Problem Noted Resolved   Maternal morbid obesity, antepartum, first trimester (HCC) 08/17/2020 by Nadara Mustard, MD No   Overview Signed 08/17/2020  1:57 PM by Nadara Mustard, MD    BMI >=40 [x ] early fasting glc (no glucola due to recent gastric bypass)  [ ]  screen sleep apnea [x ] anesthesia consult (early and late if BMI > 45) [x ] u/s for dating [x ]   [x ] nutritional goals [x ] folic acid 1mg  [x ] bASA (>12 weeks) [x ] consider nutrition consult [ ]  consider maternal EKG 1st trimester [ ]  Growth u/s 28 [ ] , 32 [ ] , 36 weeks [ ]  [ ]  NST/AFI weekly 34+ weeks (34[] ,35[] ,36[] , 37[] , 38[] )      History of gastric bypass     Overview Signed 08/17/2020  1:53 PM by , MD    Vitamin use counseled Avoid glucola    Alternative screening for GDM discussed    Pt has never had GDM, thus is low risk          , MD, Ob/Gyn, [facility] 09/14/2020  2:25 PM

## 2020-09-14 NOTE — Patient Instructions (Signed)
Genetic Testing During Pregnancy Why is genetic testing done? Genetic testing during pregnancy is also called prenatal genetic testing. This type of testing can determine if your baby is at risk of being born with a disorder caused by abnormal genes or chromosomes (genetic disorder). Chromosomes contain genes that control how your baby will develop in your womb. There are many different genetic disorders. Examples of genetic disorders that may be found through genetic testing include Down syndrome and cystic fibrosis. Gene changes (mutations) can be passed down through families. Genetic testing is offered to women during pregnancy. You can choose whether to have genetic testing. Having genetic testing allows you to:  Discuss your test results and options with your health care provider.  Prepare for a baby that may be born with a genetic disorder. Learning about the disorder ahead of time helps you be better prepared to manage it. Your health care providers can also be prepared in case your baby requires special care before or after birth.  Consider whether you want to continue with the pregnancy. In some cases, genetic testing may be done to learn about the traits a child will inherit. Types of genetic tests There are two basic types of genetic testing. Screening tests indicate whether your developing baby (fetus) is at higher risk for a genetic disorder. Diagnostic tests check actual fetal cells to diagnose a genetic disorder. Screening tests Screening tests will not harm your baby. They are recommended for all pregnant women. Types of screening tests include:  Carrier screening. This test involves checking genes from both parents by testing their blood or saliva. The test checks to find out if the parents carry a genetic mutation that may be passed to a baby. In most cases, both parents must carry the mutation for a baby to be at risk.  First trimester screening. This test combines a blood test  with sound wave imaging of your baby (fetal ultrasound). This screening test checks for a risk of Down syndrome or other defects caused by having extra chromosomes. The ultrasound also checks for defects of the heart, abdomen, or skeleton.  Second trimester screening also combines a blood test with a fetal ultrasound exam. This test checks for a risk of Down syndrome or other defects caused by having extra chromosomes. The ultrasound allows your health care provider to look for genetic defects of the face, brain, spine, heart, or limbs. Some women may choose to only have an ultrasound exam without a blood test.  Combined or sequential screening. This type of testing combines the results of first and second trimester screening. This type of testing may be more accurate than first or second trimester screening alone.  Cell-free DNA testing. This is a blood test that detects cells released by the placenta that get into the mother's blood. It can be used to check for a risk of Down syndrome, other extra chromosome syndromes, and disorders caused by abnormal numbers of sex chromosomes. This test can be done any time after 10 weeks of pregnancy.      Diagnostic tests Diagnostic tests carry slight risks of problems, including bleeding, infection, and loss of the pregnancy. These tests are done only if your baby is at risk for a genetic disorder. Your health care provider will discuss the risks and benefits of having diagnostic tests before performing these types of tests. Examples of diagnostic tests include:  Chorionic villus sampling (CVS). This involves a procedure to remove and test a sample of cells taken from the   placenta. The procedure may be done between 10 and 12 weeks of pregnancy.  Amniocentesis. This involves a procedure to remove and test a sample of fluid (amniotic fluid) and cells from the sac that surrounds the developing baby. The procedure may be done any time during the pregnancy, but it is  usually done between 15 and 20 weeks of pregnancy. What do the results mean? For a screening test:  If the results are negative, it often means that your child is not at higher risk. There is still a slight chance your child could have a genetic disorder.  If the results are positive, it does not mean your child will have a genetic disorder. It may mean that your child has a higher-than-normal risk for a genetic disorder. In that case, you should talk with your health care provider about whether you should have diagnostic genetic tests. For a diagnostic test:  If the result is negative, it is unlikely that your child will have a genetic disorder.  If the test is positive for a genetic disorder, it is likely that your child will have the disorder. The test may not tell how severe the disorder will be. Talk with your health care provider about your options. Talk with your health care provider about what your results mean. Questions to ask your health care provider Before talking to your health care provider about genetic testing, find out if there is a history of genetic disorders in your family. It may also help to know your family's ethnic origins. Then ask your health care provider the following questions:  Is my baby at risk for a genetic disorder?  What are the benefits of having genetic screening?  What tests are best for me and my baby?  What are the risks of each test?  If I get a positive result on a screening test, what is the next step?  Should I meet with a genetic counselor?  Should my partner or other members of my family be tested?  How much do the tests cost? Will my insurance cover the testing? Summary  Genetic testing is done during pregnancy to find out whether your child is at risk for a genetic disorder.  Genetic testing is offered to women during pregnancy. You can choose whether to have genetic testing.  There are two basic types of genetic testing. Screening  tests indicate whether your developing baby (fetus) is at higher risk for a genetic disorder. Diagnostic tests check actual fetal cells to diagnose a genetic disorder.  If a diagnostic genetic test is positive, talk with your health care provider about your options. This information is not intended to replace advice given to you by your health care provider. Make sure you discuss any questions you have with your health care provider. Document Revised: 10/16/2019 Document Reviewed: 10/16/2019 Elsevier Patient Education  2021 [employer]vier Inc.  

## 2020-09-15 LAB — CMP AND LIVER
ALT: 21 IU/L (ref 0–32)
AST: 15 IU/L (ref 0–40)
Albumin: 4 g/dL (ref 3.9–5.0)
Alkaline Phosphatase: 97 IU/L (ref 44–121)
BUN: 6 mg/dL (ref 6–20)
Bilirubin Total: 0.8 mg/dL (ref 0.0–1.2)
Bilirubin, Direct: 0.26 mg/dL (ref 0.00–0.40)
CO2: 19 mmol/L — ABNORMAL LOW (ref 20–29)
Calcium: 9.1 mg/dL (ref 8.7–10.2)
Chloride: 102 mmol/L (ref 96–106)
Creatinine, Ser: 0.55 mg/dL — ABNORMAL LOW (ref 0.57–1.00)
Glucose: 92 mg/dL (ref 65–99)
Potassium: 4.2 mmol/L (ref 3.5–5.2)
Sodium: 138 mmol/L (ref 134–144)
Total Protein: 6 g/dL (ref 6.0–8.5)
eGFR: 127 mL/min/{1.73_m2} (ref >59)

## 2020-09-15 LAB — HEPATITIS C ANTIBODY: Hep C Virus Ab: <0.1 s/co ratio (ref 0.0–0.9)

## 2020-09-15 LAB — VITAMIN B12: Vitamin B-12: 715 pg/mL (ref 232–1245)

## 2020-09-15 LAB — VITAMIN D 25 HYDROXY (VIT D DEFICIENCY, FRACTURES): Vit D, 25-Hydroxy: 27 ng/mL — ABNORMAL LOW (ref 30.0–100.0)

## 2020-09-19 ENCOUNTER — Other Ambulatory Visit: Payer: Self-pay | Admitting: Obstetrics

## 2020-09-19 DIAGNOSIS — B3731 Acute candidiasis of vulva and vagina: Secondary | ICD-10-CM

## 2020-09-19 LAB — MATERNIT21 PLUS CORE+SCA
Fetal Fraction: 7
Monosomy X (Turner Syndrome): NOT DETECTED
Result (T21): NEGATIVE
Trisomy 13 (Patau syndrome): NEGATIVE
Trisomy 18 (Edwards syndrome): NEGATIVE
Trisomy 21 (Down syndrome): NEGATIVE
XXX (Triple X Syndrome): NOT DETECTED
XXY (Klinefelter Syndrome): NOT DETECTED
XYY (Jacobs Syndrome): NOT DETECTED

## 2020-09-19 MED ORDER — FLUCONAZOLE 150 MG PO TABS: 150.0000 mg | ORAL_TABLET | Freq: Once | ORAL | 0 refills | Status: AC

## 2020-09-23 ENCOUNTER — Other Ambulatory Visit: Payer: Self-pay | Admitting: Obstetrics & Gynecology

## 2020-09-26 NOTE — Telephone Encounter (Signed)
Please advise. [facility] is out of office and patient states she is suppose to have ultrasound orders place for additional imaging. No orders are placed at this time. Could you please place order since [facility] isn't here?

## 2020-09-26 NOTE — Telephone Encounter (Signed)
I would assume the MFM referral is the ultrasound not sure this is none emergent

## 2020-09-27 NOTE — Telephone Encounter (Signed)
Addressed by Derry Skill @11 :56am. See separate patient advise request.

## 2020-09-27 NOTE — Telephone Encounter (Signed)
Spoke with patient via phone, patient stated that she was not really concerned about the spotting as it just had a light pink tint to it The cramping throughout the night was maybe a 5 -6/10 pain. Sandy [name] says she will continue to monitor her symptoms because she does not want to go to the ED because fear of becoming sick/does not want to wait forever to be seen. Will call us back if she needs to. Taira Knabe,cma(aama)

## 2020-09-27 NOTE — Telephone Encounter (Signed)
Hey Crystal, could you look into if this patient was supposed to be scheduled for additional imaging. There wasn't an order placed. And let patient know?

## 2020-09-30 ENCOUNTER — Telehealth: Payer: Self-pay | Admitting: Obstetrics and Gynecology

## 2020-09-30 ENCOUNTER — Inpatient Hospital Stay (HOSPITAL_COMMUNITY)
Admission: AD | Admit: 2020-09-30 | Discharge: 2020-09-30 | Disposition: A | Discharge status: Home / Self Care | Payer: Medicaid Other | Source: Ambulatory Visit | Attending: Obstetrics & Gynecology | Admitting: Obstetrics & Gynecology

## 2020-09-30 ENCOUNTER — Other Ambulatory Visit: Payer: Self-pay

## 2020-09-30 ENCOUNTER — Inpatient Hospital Stay (HOSPITAL_COMMUNITY): Payer: Medicaid Other

## 2020-09-30 DIAGNOSIS — O0991 Supervision of high risk pregnancy, unspecified, first trimester: Secondary | ICD-10-CM

## 2020-09-30 DIAGNOSIS — R102 Pelvic and perineal pain: Secondary | ICD-10-CM | POA: Insufficient documentation

## 2020-09-30 DIAGNOSIS — O99211 Obesity complicating pregnancy, first trimester: Secondary | ICD-10-CM | POA: Diagnosis not present

## 2020-09-30 DIAGNOSIS — O26851 Spotting complicating pregnancy, first trimester: Secondary | ICD-10-CM | POA: Insufficient documentation

## 2020-09-30 DIAGNOSIS — R109 Unspecified abdominal pain: Secondary | ICD-10-CM | POA: Insufficient documentation

## 2020-09-30 DIAGNOSIS — Z3A13 13 weeks gestation of pregnancy: Secondary | ICD-10-CM | POA: Diagnosis not present

## 2020-09-30 DIAGNOSIS — O3401 Maternal care for unspecified congenital malformation of uterus, first trimester: Secondary | ICD-10-CM | POA: Insufficient documentation

## 2020-09-30 DIAGNOSIS — O26899 Other specified pregnancy related conditions, unspecified trimester: Secondary | ICD-10-CM

## 2020-09-30 DIAGNOSIS — O26891 Other specified pregnancy related conditions, first trimester: Secondary | ICD-10-CM | POA: Diagnosis not present

## 2020-09-30 DIAGNOSIS — K219 Gastro-esophageal reflux disease without esophagitis: Secondary | ICD-10-CM | POA: Diagnosis not present

## 2020-09-30 DIAGNOSIS — O99841 Bariatric surgery status complicating pregnancy, first trimester: Secondary | ICD-10-CM | POA: Insufficient documentation

## 2020-09-30 DIAGNOSIS — Z87891 Personal history of nicotine dependence: Secondary | ICD-10-CM | POA: Diagnosis not present

## 2020-09-30 DIAGNOSIS — Q513 Bicornate uterus: Secondary | ICD-10-CM | POA: Diagnosis not present

## 2020-09-30 DIAGNOSIS — O99611 Diseases of the digestive system complicating pregnancy, first trimester: Secondary | ICD-10-CM | POA: Insufficient documentation

## 2020-09-30 DIAGNOSIS — Z6790 Unspecified blood type, Rh positive: Secondary | ICD-10-CM

## 2020-09-30 DIAGNOSIS — N939 Abnormal uterine and vaginal bleeding, unspecified: Secondary | ICD-10-CM

## 2020-09-30 DIAGNOSIS — O0992 Supervision of high risk pregnancy, unspecified, second trimester: Secondary | ICD-10-CM | POA: Insufficient documentation

## 2020-09-30 DIAGNOSIS — Z9884 Bariatric surgery status: Secondary | ICD-10-CM

## 2020-09-30 LAB — CBC
HCT: 38.5 % (ref 36.0–46.0)
Hemoglobin: 14.2 g/dL (ref 12.0–15.0)
MCH: 32.6 pg (ref 26.0–34.0)
MCHC: 36.9 g/dL — ABNORMAL HIGH (ref 30.0–36.0)
MCV: 88.3 fL (ref 80.0–100.0)
Platelets: 188 10*3/uL (ref 150–400)
RBC: 4.36 MIL/uL (ref 3.87–5.11)
RDW: 13.4 % (ref 11.5–15.5)
WBC: 7.1 10*3/uL (ref 4.0–10.5)
nRBC: 0 % (ref 0.0–0.2)

## 2020-09-30 LAB — URINALYSIS, ROUTINE W REFLEX MICROSCOPIC
Bilirubin Urine: NEGATIVE
Glucose, UA: NEGATIVE mg/dL
Hgb urine dipstick: NEGATIVE
Ketones, ur: 5 mg/dL — AB
Nitrite: NEGATIVE
Protein, ur: 30 mg/dL — AB
Specific Gravity, Urine: 1.026 (ref 1.005–1.030)
pH: 8 (ref 5.0–8.0)

## 2020-09-30 LAB — HCG, QUANTITATIVE, PREGNANCY: hCG, Beta Chain, Quant, S: 175109 m[IU]/mL — ABNORMAL HIGH (ref <5)

## 2020-09-30 LAB — WET PREP, GENITAL
Clue Cells Wet Prep HPF POC: NONE SEEN
Sperm: NONE SEEN
Trich, Wet Prep: NONE SEEN
Yeast Wet Prep HPF POC: NONE SEEN

## 2020-09-30 LAB — COMPREHENSIVE METABOLIC PANEL
ALT: 21 U/L (ref 0–44)
AST: 19 U/L (ref 15–41)
Albumin: 3.2 g/dL — ABNORMAL LOW (ref 3.5–5.0)
Alkaline Phosphatase: 73 U/L (ref 38–126)
Anion gap: 6 (ref 5–15)
BUN: 5 mg/dL — ABNORMAL LOW (ref 6–20)
CO2: 26 mmol/L (ref 22–32)
Calcium: 8.9 mg/dL (ref 8.9–10.3)
Chloride: 105 mmol/L (ref 98–111)
Creatinine, Ser: 0.56 mg/dL (ref 0.44–1.00)
GFR, Estimated: >60 mL/min (ref >60)
Glucose, Bld: 90 mg/dL (ref 70–99)
Potassium: 3.9 mmol/L (ref 3.5–5.1)
Sodium: 137 mmol/L (ref 135–145)
Total Bilirubin: 0.6 mg/dL (ref 0.3–1.2)
Total Protein: 5.9 g/dL — ABNORMAL LOW (ref 6.5–8.1)

## 2020-09-30 NOTE — MAU Note (Signed)
Pt reports she started having pain in her LLQ that started on Monday. Pt has bicornate uterus Pt did reports some spotting this morning  and a few mornings this weeks as well.

## 2020-09-30 NOTE — Telephone Encounter (Signed)
Patient called stating that she has an upcoming new ob apt with [facility] next week, she is currently having cramping on left from her back to her lower front ovary area with little spotting- pt is [redacted] weeks pregnant. 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 Pt currently sees [facility]- she states that she reached out to report symptoms did not receive any help other than go to ER. I made pt aware that we might not be able to provide any medical advise since she has never been seen and providers are unaware of her medical history. I ensured patient that with those symptoms or bleeding worsen, it is in her best interest to go to ER to be evaluated.

## 2020-09-30 NOTE — Telephone Encounter (Signed)
Spoke w/patient. Advised an alternative option to the ER is Maternal Assessment Unit at  [facility] at [facility] [address] [place], [place], [place] [address] Hours:  Open 24 hours Phone: [phone]

## 2020-09-30 NOTE — Telephone Encounter (Signed)
Spoke to pt. Informed pt that I was not able to provide any medical advise due to that she was not an est pt with our office. Went on advise given by [facility]. Pt stated that she was planning on going to the [facility] in [place] after work.

## 2020-09-30 NOTE — MAU Provider Note (Signed)
History     CSN: [id number]  Arrival date and time: 09/30/20 1194   Event Date/Time   First Provider Initiated Contact with Patient 09/30/20 2055      Chief Complaint  Patient presents with   Abdominal Pain   Ms. Sandy [name] is a 30 y.o. [id number] at [redacted]w[redacted]d who presents to MAU for vaginal bleeding which began Monday 09/26/2020. Patient reports she only experiences pink spotting after wiping when using the restroom and sometimes it has a streak of red. Patient reports she only sees this early in the morning when she wakes up in the middle of the night to use the bathroom and when she first wakes up for the day. Patient reports the spotting has not been present daily and she did not notice any blood when using the restroom in MAU. Patient also reports intermittent, midline cramping that is "not bothersome" and she has not felt the need to take medication for it. Patient reports she was told to come here by her office because they did not have anyone to perform an [place] to check on the health of her baby, and she did not want to go to [facility] ER d/t wait times. Patient has already established care with [facility] and has had her NOB visit.  Pt denies vaginal discharge/odor/itching. Pt denies N/V, abdominal pain, constipation, diarrhea, or urinary problems. Pt denies fever, chills, fatigue, sweating or changes in appetite. Pt denies SOB or chest pain. Pt denies dizziness, HA, light-headedness, weakness.   OB History     Gravida  4   Para  3   Term  3   Preterm      AB      Living  3      SAB      IAB      Ectopic      Multiple  0   Live Births  3           Past Medical History:  Diagnosis Date   Bicornate uterus    Complication of anesthesia    itching after C-Sections   Family history of adverse reaction to anesthesia    uncle has a hard time waking up   Genital HSV    GERD (gastroesophageal reflux disease)    Migraine    Obesity affecting pregnancy     BMI>40    Past Surgical History:  Procedure Laterality Date   CESAREAN SECTION  2012   for HSV outbreak   CESAREAN SECTION N/A 11/29/2015   Procedure: CESAREAN SECTION Baby Girl 7lb.[name];  Surgeon: Vena Austria, MD;  Location: [facility] ORS;  Service: Obstetrics;  Laterality: N/A;   CESAREAN SECTION N/A 12/18/2016   Procedure: CESAREAN SECTION;  Surgeon: Conard Novak, MD;  Location: [facility] ORS;  Service: Obstetrics;  Laterality: N/A;   ears Bilateral    tubes   ESOPHAGOGASTRODUODENOSCOPY (EGD) WITH PROPOFOL N/A 05/04/2016   Procedure: ESOPHAGOGASTRODUODENOSCOPY (EGD) WITH PROPOFOL;  Surgeon: Midge Minium, MD;  Location: [facility];  Service: Endoscopy;  Laterality: N/A;   HERNIA REPAIR  06/18/2019   STENT PLACEMENT ILIAC ([facility] HX)     TONSILLECTOMY      Family History  Problem Relation Age of Onset   Diabetes Maternal Aunt    Hyperlipidemia Maternal Aunt     Social History   Tobacco Use   Smoking status: Former    Pack years: 0.00    Types: Cigarettes    Quit date: 04/09/2013    Years  since quitting: 7.4   Smokeless tobacco: Never  Vaping Use   Vaping Use: Never used  Substance Use Topics   Alcohol use: Not Currently    Comment: 2x/month, none during pregnacy   Drug use: No    Allergies:  Allergies  Allergen Reactions   Oxycodone-Acetaminophen Hives and Itching   Tape     Some tapes cause blisters   Fluticasone Swelling    Mouth swelling    Medications Prior to Admission  Medication Sig Dispense Refill Last Dose   Doxylamine-Pyridoxine (DICLEGIS) 10-10 MG TBEC Take 2 tablets by mouth at bedtime. If symptoms persist, add one tablet in the morning and one in the afternoon 100 tablet 5    ondansetron (ZOFRAN ODT) 4 MG disintegrating tablet Take 1 tablet (4 mg total) by mouth every 6 (six) hours as needed for nausea. 20 tablet 0    ondansetron (ZOFRAN ODT) 4 MG disintegrating tablet Take 1 tablet (4 mg total) by mouth every 6 (six) hours as needed for  nausea. 20 tablet 0    vitamin B-6 (PYRIDOXINE) 25 MG tablet Take 1 tablet (25 mg total) by mouth 3 (three) times daily before meals. 90 tablet 4     Review of Systems  Constitutional:  Negative for chills, diaphoresis, fatigue and fever.  Eyes:  Negative for visual disturbance.  Respiratory:  Negative for shortness of breath.   Cardiovascular:  Negative for chest pain.  Gastrointestinal:  Negative for abdominal pain, constipation, diarrhea, nausea and vomiting.  Genitourinary:  Positive for pelvic pain and vaginal bleeding. Negative for dysuria, flank pain, frequency, urgency and vaginal discharge.  Neurological:  Negative for dizziness, weakness, light-headedness and headaches.   Physical Exam   Blood pressure 119/65, pulse 78, temperature 98.6 F (37 C), resp. rate 18, height 5' 5.5" (1.664 m), weight 111.6 kg, last menstrual period 06/14/2020.  Patient Vitals for the past 24 hrs:  BP Temp Pulse Resp Height Weight  09/30/20 1925 119/65 98.6 F (37 C) 78 18 5' 5.5" (1.664 m) 111.6 kg   Physical Exam Vitals and nursing note reviewed.  Constitutional:      General: She is not in acute distress.    Appearance: Normal appearance. She is not ill-appearing, toxic-appearing or diaphoretic.  HENT:     Head: Normocephalic and atraumatic.  Pulmonary:     Effort: Pulmonary effort is normal.  Neurological:     Mental Status: She is alert and oriented to person, place, and time.  Psychiatric:        Mood and Affect: Mood normal.        Behavior: Behavior normal.        Thought Content: Thought content normal.        Judgment: Judgment normal.   Results for orders placed or performed during the hospital encounter of 09/30/20 (from the past 24 hour(s))  CBC     Status: Abnormal   Collection Time: 09/30/20  7:09 PM  Result Value Ref Range   WBC 7.1 4.0 - 10.5 K/uL   RBC 4.36 3.87 - 5.11 MIL/uL   Hemoglobin 14.2 12.0 - 15.0 g/dL   HCT 65.738.5 84.636.0 - 96.246.0 %   MCV 88.3 80.0 - 100.0 fL    MCH 32.6 26.0 - 34.0 pg   MCHC 36.9 (H) 30.0 - 36.0 g/dL   RDW 95.213.4 84.111.5 - 32.415.5 %   Platelets 188 150 - 400 K/uL   nRBC 0.0 0.0 - 0.2 %  Comprehensive metabolic panel  Status: Abnormal   Collection Time: 09/30/20  7:09 PM  Result Value Ref Range   Sodium 137 135 - 145 mmol/L   Potassium 3.9 3.5 - 5.1 mmol/L   Chloride 105 98 - 111 mmol/L   CO2 26 22 - 32 mmol/L   Glucose, Bld 90 70 - 99 mg/dL   BUN 5 (L) 6 - 20 mg/dL   Creatinine, Ser 8.12 0.44 - 1.00 mg/dL   Calcium 8.9 8.9 - 75.1 mg/dL   Total Protein 5.9 (L) 6.5 - 8.1 g/dL   Albumin 3.2 (L) 3.5 - 5.0 g/dL   AST 19 15 - 41 U/L   ALT 21 0 - 44 U/L   Alkaline Phosphatase 73 38 - 126 U/L   Total Bilirubin 0.6 0.3 - 1.2 mg/dL   GFR, Estimated >70 >01 mL/min   Anion gap 6 5 - 15  Wet prep, genital     Status: Abnormal   Collection Time: 09/30/20  7:37 PM   Specimen: PATH Cytology Cervicovaginal Ancillary Only  Result Value Ref Range   Yeast Wet Prep HPF POC NONE SEEN NONE SEEN   Trich, Wet Prep NONE SEEN NONE SEEN   Clue Cells Wet Prep HPF POC NONE SEEN NONE SEEN   WBC, Wet Prep HPF POC MANY (A) NONE SEEN   Sperm NONE SEEN   Urinalysis, Routine w reflex microscopic Urine, Clean Catch     Status: Abnormal   Collection Time: 09/30/20  7:37 PM  Result Value Ref Range   Color, Urine AMBER (A) YELLOW   APPearance HAZY (A) CLEAR   Specific Gravity, Urine 1.026 1.005 - 1.030   pH 8.0 5.0 - 8.0   Glucose, UA NEGATIVE NEGATIVE mg/dL   Hgb urine dipstick NEGATIVE NEGATIVE   Bilirubin Urine NEGATIVE NEGATIVE   Ketones, ur 5 (A) NEGATIVE mg/dL   Protein, ur 30 (A) NEGATIVE mg/dL   Nitrite NEGATIVE NEGATIVE   Leukocytes,Ua SMALL (A) NEGATIVE   RBC / HPF 0-5 0 - 5 RBC/hpf   WBC, UA 0-5 0 - 5 WBC/hpf   Bacteria, UA FEW (A) NONE SEEN   Squamous Epithelial / LPF 6-10 0 - 5   Mucus PRESENT    Hyaline Casts, UA PRESENT    US OB Comp Less 14 Wks  Result Date: 09/30/2020 CLINICAL DATA:  Left lower quadrant pain since Monday.  Spotting this morning. Estimated gestational age of [redacted] weeks, 1 day. EXAM: OBSTETRIC <14 WK ULTRASOUND TECHNIQUE: Transabdominal ultrasound was performed for evaluation of the gestation as well as the maternal uterus and adnexal regions. COMPARISON:  Ultrasound dated August 05, 2020. FINDINGS: Intrauterine gestational sac: Single. Yolk sac:  Not Visualized. Embryo:  Visualized. Cardiac Activity: Visualized. Heart Rate: 160 bpm CRL:   73.1 mm   13 w 3 d                  [place] EDC: 04/04/2021 Subchorionic hemorrhage:  None visualized. Maternal uterus/adnexae: Unremarkable. IMPRESSION: Single live intrauterine pregnancy with estimated gestational age of [redacted] weeks, 3 days. No acute abnormality. Electronically Signed   By: Obie Dredge M.D.   On: 09/30/2020 20:14    MAU Course  Procedures  MDM -r/o ectopic -UA: amber/hazy/5ketones/30PRO/sm leuks/few, sending urine for culture -CBC: WNL -CMP: no abnormalities requiring treatment -[place]: single IUP, +FHR 160, [redacted]w[redacted]d, no abnormality -hCG: pending at time of discharge -ABO: A Positive -WetPrep: WNL -GC/CT collected -pt discharged to home in stable condition  Orders Placed This Encounter  Procedures   [employer]  prep, genital    Standing Status:   Standing    Number of Occurrences:   1   Culture, OB Urine    Standing Status:   Standing    Number of Occurrences:   1   US OB Comp Less 14 Wks    Standing Status:   Standing    Number of Occurrences:   1    Order Specific Question:   Symptom/Reason for Exam    Answer:   Abdominal pain affecting pregnancy [[id number]]   CBC    Standing Status:   Standing    Number of Occurrences:   1   Comprehensive metabolic panel    Standing Status:   Standing    Number of Occurrences:   1   hCG, quantitative, pregnancy    Standing Status:   Standing    Number of Occurrences:   1   Urinalysis, Routine w reflex microscopic Urine, Clean Catch    Standing Status:   Standing    Number of Occurrences:   1   Discharge patient     Order Specific Question:   Discharge disposition    Answer:   01-Home or Self Care [1]    Order Specific Question:   Discharge patient date    Answer:   09/30/2020   No orders of the defined types were placed in this encounter.  Assessment and Plan   1. Vaginal spotting   2. Abdominal pain affecting pregnancy   3. History of gastric bypass   4. High-risk pregnancy, first trimester   5. Maternal morbid obesity, antepartum, first trimester (HCC)   6. Blood type, Rh positive   7. Pelvic cramping   8. [redacted] weeks gestation of pregnancy     Allergies as of 09/30/2020       Reactions   Oxycodone-acetaminophen Hives, Itching   Tape    Some tapes cause blisters   Fluticasone Swelling   Mouth swelling        Medication List     TAKE these medications    Doxylamine-Pyridoxine 10-10 MG Tbec Commonly known as: Diclegis Take 2 tablets by mouth at bedtime. If symptoms persist, add one tablet in the morning and one in the afternoon   ondansetron 4 MG disintegrating tablet Commonly known as: Zofran ODT Take 1 tablet (4 mg total) by mouth every 6 (six) hours as needed for nausea.   ondansetron 4 MG disintegrating tablet Commonly known as: Zofran ODT Take 1 tablet (4 mg total) by mouth every 6 (six) hours as needed for nausea.   vitamin B-6 25 MG tablet Commonly known as: pyridOXINE Take 1 tablet (25 mg total) by mouth 3 (three) times daily before meals.       -keep OB appt as scheduled -return MAU precautions given -pt discharged to home in stable condition  Joni Reining E Eduard Penkala 09/30/2020, 9:28 PM

## 2020-09-30 NOTE — Telephone Encounter (Signed)
Spoke with patient via phone, advised patient per SDJ to go to the ED to be evaluated. Patient states she is still trying to avoid going to the ED because she does not want to wait hours to be seen. Told patient that I understand her concerns for wait time; however we do not have any available appointments or sonographer at this time. Patient verbalized understanding and said she would make a decision as to whether or not she would go to the ED.  Also patient is scheduled to see [facility] for NOB, per patient it's for a second opinion she has not decided to transfer her care as of yet. drl

## 2020-09-30 NOTE — Telephone Encounter (Signed)
Spoke with patient via phone, advised patient per SDJ to go to the ED to be evaluated. Patient states she is still trying to avoid going to the ED because she does not want to wait hours to be seen. Told patient that I understand her concerns for wait time; however we do not have any available appointments or sonographer at this time. Patient verbalized understanding and said she would make a decision as to whether or not she would go to the ED.  Also patient is scheduled to see [facility] for NOB, per patient it's for a second opinion she has not decided to transfer her care as of yet. drl 

## 2020-10-02 ENCOUNTER — Other Ambulatory Visit: Payer: Self-pay | Admitting: Obstetrics & Gynecology

## 2020-10-02 DIAGNOSIS — Z9884 Bariatric surgery status: Secondary | ICD-10-CM

## 2020-10-02 DIAGNOSIS — O3402 Maternal care for unspecified congenital malformation of uterus, second trimester: Secondary | ICD-10-CM

## 2020-10-02 LAB — CULTURE, OB URINE

## 2020-10-03 LAB — GC/CHLAMYDIA PROBE AMP (~~LOC~~) NOT AT ARMC
Chlamydia: NEGATIVE
Comment: NEGATIVE
Comment: NORMAL
Neisseria Gonorrhea: NEGATIVE

## 2020-10-04 ENCOUNTER — Ambulatory Visit (HOSPITAL_BASED_OUTPATIENT_CLINIC_OR_DEPARTMENT_OTHER): Payer: Medicaid Other | Admitting: Obstetrics and Gynecology

## 2020-10-04 ENCOUNTER — Other Ambulatory Visit: Payer: Self-pay | Admitting: Obstetrics and Gynecology

## 2020-10-04 ENCOUNTER — Other Ambulatory Visit
Admission: RE | Admit: 2020-10-04 | Discharge: 2020-10-04 | Disposition: A | Discharge status: Home / Self Care | Payer: Medicaid Other | Source: Ambulatory Visit | Attending: Anesthesiology | Admitting: Anesthesiology

## 2020-10-04 ENCOUNTER — Ambulatory Visit: Payer: Medicaid Other | Attending: Obstetrics and Gynecology

## 2020-10-04 ENCOUNTER — Other Ambulatory Visit: Payer: Self-pay

## 2020-10-04 DIAGNOSIS — Z3A14 14 weeks gestation of pregnancy: Secondary | ICD-10-CM

## 2020-10-04 DIAGNOSIS — O99842 Bariatric surgery status complicating pregnancy, second trimester: Secondary | ICD-10-CM

## 2020-10-04 DIAGNOSIS — O09292 Supervision of pregnancy with other poor reproductive or obstetric history, second trimester: Secondary | ICD-10-CM

## 2020-10-04 DIAGNOSIS — O34219 Maternal care for unspecified type scar from previous cesarean delivery: Secondary | ICD-10-CM | POA: Diagnosis not present

## 2020-10-04 DIAGNOSIS — O99841 Bariatric surgery status complicating pregnancy, first trimester: Secondary | ICD-10-CM | POA: Diagnosis not present

## 2020-10-04 DIAGNOSIS — O99210 Obesity complicating pregnancy, unspecified trimester: Secondary | ICD-10-CM

## 2020-10-04 DIAGNOSIS — O99211 Obesity complicating pregnancy, first trimester: Secondary | ICD-10-CM | POA: Diagnosis present

## 2020-10-04 DIAGNOSIS — O34591 Maternal care for other abnormalities of gravid uterus, first trimester: Secondary | ICD-10-CM | POA: Diagnosis not present

## 2020-10-04 DIAGNOSIS — O34211 Maternal care for low transverse scar from previous cesarean delivery: Secondary | ICD-10-CM | POA: Diagnosis not present

## 2020-10-04 DIAGNOSIS — Q513 Bicornate uterus: Secondary | ICD-10-CM | POA: Insufficient documentation

## 2020-10-04 NOTE — Telephone Encounter (Signed)
Do you schedule this?

## 2020-10-04 NOTE — Telephone Encounter (Signed)
Spoke w/patient to verify she is aware that MFM has her scheduled today at 3:30. She advised that they had called her as she was leaving her Anesthesia appointment this morning.

## 2020-10-04 NOTE — Progress Notes (Signed)
Maternal-Fetal Medicine   Name: Sandy [name] DOB: 1990-11-27 MRN: [id number] Referring Provider: Velora Mediate, MD  I had the pleasure of seeing Sandy [name] today at the Maternal Fetal Care, [facility].  She is G4 P3 at 14-weeks' gestation and is here for ultrasound and consultation.  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  Patient reports she lost 85 pounds since surgery.  She had good postoperative recovery. Past surgical history is also significant for 3 cesarean deliveries.  Past medical history: No history of diabetes or hypertension or any chronic medical conditions. Medications: Prenatal vitamins. Allergies: Percocet (itching). Social history: Denies tobacco or drug or alcohol use.  She has been married 2 months and her husband is in good health.  Her previous children for from a different partner who died of drug overdose. Family history: Father died of myocardial infarction at age 28 and her mother is in good health.  No history of venous thromboembolism in the family.   Obstetrical history is significant for 3 previous cesarean deliveries (2012/2017/2018).  I reviewed the op note of the last 2 deliveries and they were both low transverse cesarean deliveries.  Patient reports her first cesarean delivery was performed because of primary genital herpes infection. She reports that her most recent pregnancy was complicated by fetal growth restriction.  GYN history: No history of abnormal Pap smears or cervical surgeries.  Prenatal course: Patient was evaluated at the MAU at [facility] [place].  She had ultrasound that established her EDD.  On cell free fetal DNA screening, she had low risk for fetal aneuploidies.  Ultrasound On today's ultrasound, the CRL measurement is consistent with the previously established dates and good fetal heart activity seen.   The nuchal translucency could not be measured because of fetal position.  Fetal anatomical survey that could be ascertained at this gestational age appears normal. Placenta is anterior and there is no evidence of previa or accreta.  Our concerns include History of bariatric surgery -Bariatric surgery improves pregnancy outcomes.  Reduction in gestational diabetes, preeclampsia, chorioamnionitis, fetal macrosomia and NICU admission rates are all decreased. -History of bariatric surgery seems to increase the risk of having small for gestational age neonates.   -Nutritional deficiencies can occur.  Recent vitamin B-12 levels are within normal range.  Vitamin D levels are low. - A joint 2013 clinical practice guideline by the American Association of Clinical Endocrinologists, the Obesity Society, and the American Society for Metabolic & Bariatric Surgery recommended that women avoid conception for 12 to18 months after bariatric surgery.  This is developed some weight loss.  I informed the patient that she should not attempt to reduce weight during pregnancy and the pregnancy weight gain is expected. -Bowel obstruction: Several reports of published the complication of bowel obstruction following bariatric surgery.  It is more commonly seen with our RYGB procedure, which was performed in outpatient.  Any new symptoms of severe abdominal pain should raise the possibility of a bowel obstruction and the patient should be evaluated.  Majority of bowel obstructions occur within 2 years of surgery. -Other complications include bowel volvulus and gastric jejunal perforation.  Previous cesarean deliveries I reassured her of normal placental location.  The placenta is anterior and there is no evidence of previa or accreta.  I counseled her that repeat cesarean deliveries increase the risk of previa or accreta.  Patient informed that she is keen on VBAC.  I  explained that most obstetricians will advise repeat cesarean  deliveries and that she may follow her providers advice.  Recommendations -An appointment was made for her to return in 6 weeks for detailed fetal anatomical survey. -Fetal growth assessment every 4 weeks till delivery. -Check vitamin B12, folate and every trimester.  Anemia of present should be corrected. -Vitamin D supplements may be considered. -Repeat cesarean delivery at [redacted] weeks gestation.  Thank you for consultation. If you have any questions, please contact me at the [facility]. Consultation including face-to-face counseling 45 minutes.

## 2020-10-04 NOTE — Consult Note (Signed)
[facility] Anesthesia Consultation  Sandy [name] ZOX:[id number] DOB: May 31, 1990 DOA: 10/04/2020 PCP: Patient, No Pcp Per (Inactive)   Requesting physician: Dr. Tiburcio Pea Date of consultation: 10/04/20 Reason for consultation: Obesity during pregnancy  CHIEF COMPLAINT:  Obesity during pregnancy  HISTORY OF PRESENT ILLNESS: Sandy [name]  is a 30 y.o. female with a known history of obesity during pregnancy. Three prior cesarean deliveries, planning for repeat csection. Notes hx of significant itching from spinal medications. Also had worse pain/discomfort during her last procedure.   PAST MEDICAL HISTORY:   Past Medical History:  Diagnosis Date   Bicornate uterus    Complication of anesthesia    itching after C-Sections   Family history of adverse reaction to anesthesia    uncle has a hard time waking up   Genital HSV    GERD (gastroesophageal reflux disease)    Migraine    Obesity affecting pregnancy    BMI>40    PAST SURGICAL HISTORY:  Past Surgical History:  Procedure Laterality Date   CESAREAN SECTION  2012   for HSV outbreak   CESAREAN SECTION N/A 11/29/2015   Procedure: CESAREAN SECTION Baby Girl 7lb.Sandy [name].;  Surgeon: Vena Austria, MD;  Location: [facility] ORS;  Service: Obstetrics;  Laterality: N/A;   CESAREAN SECTION N/A 12/18/2016   Procedure: CESAREAN SECTION;  Surgeon: Conard Novak, MD;  Location: [facility] ORS;  Service: Obstetrics;  Laterality: N/A;   ears Bilateral    tubes   ESOPHAGOGASTRODUODENOSCOPY (EGD) WITH PROPOFOL N/A 05/04/2016   Procedure: ESOPHAGOGASTRODUODENOSCOPY (EGD) WITH PROPOFOL;  Surgeon: Midge Minium, MD;  Location: [facility];  Service: Endoscopy;  Laterality: N/A;   HERNIA REPAIR  06/18/2019   STENT PLACEMENT ILIAC ([facility] HX)     TONSILLECTOMY      SOCIAL HISTORY:  Social History   Tobacco Use   Smoking status: Former    Pack years: 0.00    Types: Cigarettes    Quit date: 04/09/2013    Years  since quitting: 7.4   Smokeless tobacco: Never  Substance Use Topics   Alcohol use: Not Currently    Comment: 2x/month, none during pregnacy    FAMILY HISTORY:  Family History  Problem Relation Age of Onset   Diabetes Maternal Aunt    Hyperlipidemia Maternal Aunt     DRUG ALLERGIES:  Allergies  Allergen Reactions   Oxycodone-Acetaminophen Hives and Itching   Tape     Some tapes cause blisters   Fluticasone Swelling    Mouth swelling    REVIEW OF SYSTEMS:   RESPIRATORY: No cough, shortness of breath, wheezing.  CARDIOVASCULAR: No chest pain, orthopnea, edema.  HEMATOLOGY: No anemia, easy bruising or bleeding SKIN: No rash or lesion. NEUROLOGIC: No tingling, numbness, weakness.  PSYCHIATRY: No anxiety or depression.   MEDICATIONS AT HOME:  Prior to Admission medications   Medication Sig Start Date End Date Taking? Authorizing Provider  Doxylamine-Pyridoxine (DICLEGIS) 10-10 MG TBEC Take 2 tablets by mouth at bedtime. If symptoms persist, add one tablet in the morning and one in the afternoon 09/06/20   Nadara Mustard, MD  ondansetron (ZOFRAN ODT) 4 MG disintegrating tablet Take 1 tablet (4 mg total) by mouth every 6 (six) hours as needed for nausea. 08/24/20   Mirna Mires, CNM  ondansetron (ZOFRAN ODT) 4 MG disintegrating tablet Take 1 tablet (4 mg total) by mouth every 6 (six) hours as needed for nausea. 08/24/20   Mirna Mires, CNM  vitamin B-6 (PYRIDOXINE) 25 MG tablet  Take 1 tablet (25 mg total) by mouth 3 (three) times daily before meals. 08/17/20   Nadara Mustard, MD      PHYSICAL EXAMINATION:   VITAL SIGNS: Height 5' 5.5" (1.664 m), weight 109.9 kg, last menstrual period 06/14/2020.  GENERAL:  30 y.o.-year-old patient no acute distress.  HEENT: Head atraumatic, normocephalic. Oropharynx and nasopharynx clear. MP 2, TM distance >3 cm, normal mouth opening, grade 1 upper lip bite LUNGS: No use of accessory muscles of respiration.   EXTREMITIES: No pedal  edema, cyanosis, or clubbing.  NEUROLOGIC: normal gait PSYCHIATRIC: The patient is alert and oriented x 3.  SKIN: No obvious rash, lesion, or ulcer.    IMPRESSION AND PLAN:   Sandy [name]  is a 30 y.o. female presenting with obesity during pregnancy. BMI is currently 39 at [redacted] weeks gestation.   Airway exam reassuring.   Discussed plan for spinal for repeat cesarean delivery. Pt notes problems with itching after previous csection, notes that her best experience was with her daughter in 2017 and had the worst itching with her last procedure. I've reviewed her anesthetic records and she got a high dose of intrathecal morphine ( ) for her last procedure, prior procedure she only had of intrathecal morphine. She had good pain control with the lower dose as well. Would recommend using lower dose intrathecal morphine 50-136mcg to provide good postop pain control while limiting side effects. Would also recommend ordering nalbuphine to treat opioid induced itching, benadryl is not recommended for treatment of opioid induced itching as it does treat the cause and often just induces sedation. Discussed spinal vs GA if cesarean delivery is required. Discussed increased risk of difficult intubation during pregnancy should an emergency cesarean delivery be required.   Plan for delivery at [facility].

## 2020-10-05 ENCOUNTER — Encounter: Payer: Medicaid Other | Admitting: Obstetrics and Gynecology

## 2020-10-13 ENCOUNTER — Other Ambulatory Visit: Payer: Self-pay | Admitting: Obstetrics & Gynecology

## 2020-10-13 ENCOUNTER — Encounter: Payer: Medicaid Other | Admitting: Obstetrics & Gynecology

## 2020-10-13 DIAGNOSIS — Z1379 Encounter for other screening for genetic and chromosomal anomalies: Secondary | ICD-10-CM

## 2020-10-13 DIAGNOSIS — Z369 Encounter for antenatal screening, unspecified: Secondary | ICD-10-CM

## 2020-10-13 DIAGNOSIS — Z9884 Bariatric surgery status: Secondary | ICD-10-CM

## 2020-10-14 ENCOUNTER — Encounter: Payer: Self-pay | Admitting: Obstetrics and Gynecology

## 2020-10-14 ENCOUNTER — Other Ambulatory Visit: Payer: Self-pay

## 2020-10-14 ENCOUNTER — Ambulatory Visit (INDEPENDENT_AMBULATORY_CARE_PROVIDER_SITE_OTHER): Payer: Medicaid Other | Admitting: Obstetrics and Gynecology

## 2020-10-14 VITALS — BP 118/68 | Wt 244.0 lb

## 2020-10-14 DIAGNOSIS — O0992 Supervision of high risk pregnancy, unspecified, second trimester: Secondary | ICD-10-CM

## 2020-10-14 DIAGNOSIS — Z9884 Bariatric surgery status: Secondary | ICD-10-CM

## 2020-10-14 DIAGNOSIS — Z3A15 15 weeks gestation of pregnancy: Secondary | ICD-10-CM

## 2020-10-14 DIAGNOSIS — B009 Herpesviral infection, unspecified: Secondary | ICD-10-CM

## 2020-10-14 DIAGNOSIS — O99212 Obesity complicating pregnancy, second trimester: Secondary | ICD-10-CM

## 2020-10-14 DIAGNOSIS — Z98891 History of uterine scar from previous surgery: Secondary | ICD-10-CM

## 2020-10-14 NOTE — Progress Notes (Signed)
Routine Prenatal Care Visit  Subjective  Sandy [name] is a 30 y.o. [phone] at [redacted]w[redacted]d being seen today for ongoing prenatal care.  She is currently monitored for the following issues for this high-risk pregnancy and has HSV-2 (herpes simplex virus 2) infection; Bicornuate uterus; CVA (cerebral vascular accident) (HCC); Morbid obesity (HCC); Maternal morbid obesity, antepartum, first trimester (HCC); ASCUS with positive high risk HPV cervical; High-risk pregnancy, first trimester; History of gastric bypass; and History of 3 cesarean sections on their problem list.  ----------------------------------------------------------------------------------- Patient reports no complaints.   Contractions: Not present. Vag. Bleeding: None.  Movement: Absent. Leaking Fluid denies.  ----------------------------------------------------------------------------------- The following portions of the patient's history were reviewed and updated as appropriate: allergies, current medications, past family history, past medical history, past social history, past surgical history and problem list. Problem list updated.  Objective  Blood pressure 118/68, weight 244 lb (110.7 kg), last menstrual period 06/14/2020. Pregravid weight 288 lb (130.6 kg) Total Weight Gain -44 lb (-20 kg) Urinalysis: Urine Protein    Urine Glucose    Fetal Status: Fetal Heart Rate (bpm): 151   Movement: Absent     General:  Alert, oriented and cooperative. Patient is in no acute distress.  Skin: Skin is warm and dry. No rash noted.   Cardiovascular: Normal heart rate noted  Respiratory: Normal respiratory effort, no problems with respiration noted  Abdomen: Soft, gravid, appropriate for gestational age. Pain/Pressure: Absent     Pelvic:  Cervical exam deferred        Extremities: Normal range of motion.  Edema: None  Mental Status: Normal mood and affect. Normal behavior. Normal judgment and thought content.   Assessment   30 y.o.  [id number] at [redacted]w[redacted]d by  04/06/2021, by Ultrasound presenting for routine prenatal visit  Plan   pregnancy4  Problems (from 06/14/20 to present)     Problem Noted Resolved   Maternal morbid obesity, antepartum, first trimester (HCC) 08/17/2020 by Nadara Mustard, MD No   Overview Signed 08/17/2020  1:57 PM by Nadara Mustard, MD    BMI >=40 [x ] early fasting glc (no glucola due to recent gastric bypass)  [ ]  screen sleep apnea [ ]  anesthesia consult (early and late if BMI > 45) [ ]  u/s for dating [ ]   [x ] nutritional goals [x ] folic acid 1mg  [ ]  bASA (>12 weeks) [x ] consider nutrition consult [ ]  consider maternal EKG 1st trimester [ ]  Growth u/s 28 [ ] , 32 [ ] , 36 weeks [ ]  [ ]  NST/AFI weekly 34+ weeks (34[] ,35[] ,36[] , 37[] , 38[] )       High-risk pregnancy, first trimester 08/17/2020 by , MD No   Overview Addendum 09/08/2020  8:44 AM by , MD     Nursing Staff Provider  Office Location  [facility] Dating   6 wk  Language  English Anatomy    Flu Vaccine   Genetic Screen  NIPS:   TDaP vaccine    Hgb A1C or  GTT Early : Third trimester :   Covid    LAB RESULTS   Rhogam   Blood Type     Feeding Plan  Antibody    Contraception  Rubella    Circumcision  RPR     Pediatrician   HBsAg     Support Person  HIV    Prenatal Classes  Varicella     GBS  (For PCN allergy, check sensitivities)   BTL Consent  VBAC Consent  Pap  2022    Hgb Electro      CF      SMA               History of gastric bypass 08/17/2020 by Nadara Mustard, MD No   Overview Signed 08/17/2020  1:53 PM by Nadara Mustard, MD    Vitamin use counseled Avoid glucola    Alternative screening for GDM discussed    Pt has never had GDM, thus is low risk            Preterm labor symptoms and general obstetric precautions including but not limited to vaginal bleeding, contractions, leaking of fluid and fetal movement were reviewed in detail with the  patient. Please refer to After Visit Summary for other counseling recommendations.   Anatomy MFM scheduled  Return in about 4 weeks (around 11/11/2020) for Routine Prenatal Appointment.   Thomasene Mohair, MD, [facility], [facility] 10/14/2020 2:46 PM

## 2020-10-24 ENCOUNTER — Other Ambulatory Visit: Payer: Self-pay | Admitting: Obstetrics & Gynecology

## 2020-10-24 DIAGNOSIS — Z9884 Bariatric surgery status: Secondary | ICD-10-CM

## 2020-10-28 ENCOUNTER — Encounter (HOSPITAL_COMMUNITY): Payer: Self-pay | Admitting: Emergency Medicine

## 2020-10-28 ENCOUNTER — Emergency Department (HOSPITAL_COMMUNITY)
Admission: EM | Admit: 2020-10-28 | Discharge: 2020-10-29 | Disposition: A | Discharge status: Home / Self Care | Payer: Medicaid Other | Attending: Emergency Medicine | Admitting: Emergency Medicine

## 2020-10-28 ENCOUNTER — Other Ambulatory Visit: Payer: Self-pay

## 2020-10-28 ENCOUNTER — Emergency Department (HOSPITAL_COMMUNITY): Payer: Medicaid Other

## 2020-10-28 DIAGNOSIS — Z041 Encounter for examination and observation following transport accident: Secondary | ICD-10-CM | POA: Diagnosis present

## 2020-10-28 DIAGNOSIS — Z87891 Personal history of nicotine dependence: Secondary | ICD-10-CM | POA: Insufficient documentation

## 2020-10-28 DIAGNOSIS — Z3490 Encounter for supervision of normal pregnancy, unspecified, unspecified trimester: Secondary | ICD-10-CM

## 2020-10-28 DIAGNOSIS — Z3492 Encounter for supervision of normal pregnancy, unspecified, second trimester: Secondary | ICD-10-CM
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
## 2020-10-28 NOTE — Discharge Instructions (Signed)
It was our pleasure to provide your ER care today - we hope that you feel better.  Drink plenty of fluids/stay well hydrated. Take acetaminophen as need.   Follow up with your ob/gyn doctor in the coming week.  Return to ER if worse, new symptoms, new or severe pain, bleeding, or other concern.

## 2020-10-28 NOTE — ED Notes (Signed)
Fetal heart tones doppler detected at 140.

## 2020-10-29 NOTE — ED Provider Notes (Signed)
I assumed care at signout to follow-up with ultrasound imaging.  I discussed the results of radiology.  I also  discussed the case with Dr. Despina Hidden  with OB/GYN We discussed the fact the patient is currently 17 weeks she has had no significant abdominal pain or bleeding.  Patient is hemodynamically appropriate Although ultrasound did show potential "conspicuous heterogeneity", at this stage of pregnancy no further work-up or or monitoring is required.  Patient needs to monitor symptoms at home with abdominal pain, or bleeding.  She should follow-up early next week with her OB/GYN for outpatient ultrasound imaging This was discussed at length with patient. She feels comfortable with this plan.  I have also confirmed her Rh status is positive We discussed strict ER return precautions.  Patient has no other signs of acute traumatic injury   Zadie Rhine, MD 10/29/20 [phone]

## 2020-10-30 ENCOUNTER — Encounter (HOSPITAL_COMMUNITY): Payer: Self-pay | Admitting: Obstetrics and Gynecology

## 2020-10-30 ENCOUNTER — Inpatient Hospital Stay (HOSPITAL_COMMUNITY)
Admission: AD | Admit: 2020-10-30 | Discharge: 2020-10-30 | Disposition: A | Discharge status: Home / Self Care | Payer: Medicaid Other | Attending: Obstetrics and Gynecology | Admitting: Obstetrics and Gynecology

## 2020-10-30 ENCOUNTER — Other Ambulatory Visit: Payer: Self-pay

## 2020-10-30 ENCOUNTER — Inpatient Hospital Stay (HOSPITAL_BASED_OUTPATIENT_CLINIC_OR_DEPARTMENT_OTHER): Payer: Medicaid Other

## 2020-10-30 DIAGNOSIS — O9A212 Injury, poisoning and certain other consequences of external causes complicating pregnancy, second trimester: Secondary | ICD-10-CM | POA: Diagnosis not present

## 2020-10-30 DIAGNOSIS — Z041 Encounter for examination and observation following transport accident: Secondary | ICD-10-CM | POA: Diagnosis not present

## 2020-10-30 DIAGNOSIS — O26852 Spotting complicating pregnancy, second trimester: Secondary | ICD-10-CM

## 2020-10-30 DIAGNOSIS — O209 Hemorrhage in early pregnancy, unspecified: Secondary | ICD-10-CM | POA: Insufficient documentation

## 2020-10-30 DIAGNOSIS — O26892 Other specified pregnancy related conditions, second trimester: Secondary | ICD-10-CM | POA: Diagnosis not present

## 2020-10-30 DIAGNOSIS — O0991 Supervision of high risk pregnancy, unspecified, first trimester: Secondary | ICD-10-CM

## 2020-10-30 DIAGNOSIS — Z3A17 17 weeks gestation of pregnancy: Secondary | ICD-10-CM | POA: Diagnosis not present

## 2020-10-30 DIAGNOSIS — O4692 Antepartum hemorrhage, unspecified, second trimester: Secondary | ICD-10-CM | POA: Diagnosis not present

## 2020-10-30 DIAGNOSIS — R109 Unspecified abdominal pain: Secondary | ICD-10-CM | POA: Diagnosis present

## 2020-10-30 DIAGNOSIS — R102 Pelvic and perineal pain: Secondary | ICD-10-CM | POA: Insufficient documentation

## 2020-10-30 DIAGNOSIS — Z9884 Bariatric surgery status: Secondary | ICD-10-CM

## 2020-10-30 DIAGNOSIS — Z8759 Personal history of other complications of pregnancy, childbirth and the puerperium: Secondary | ICD-10-CM | POA: Diagnosis not present

## 2020-10-30 DIAGNOSIS — O99211 Obesity complicating pregnancy, first trimester: Secondary | ICD-10-CM

## 2020-10-30 LAB — CBC
HCT: 38.2 % (ref 36.0–46.0)
Hemoglobin: 14.3 g/dL (ref 12.0–15.0)
MCH: 34 pg (ref 26.0–34.0)
MCHC: 37.4 g/dL — ABNORMAL HIGH (ref 30.0–36.0)
MCV: 90.7 fL (ref 80.0–100.0)
Platelets: 153 10*3/uL (ref 150–400)
RBC: 4.21 MIL/uL (ref 3.87–5.11)
RDW: 13.5 % (ref 11.5–15.5)
WBC: 6.8 10*3/uL (ref 4.0–10.5)
nRBC: 0 % (ref 0.0–0.2)

## 2020-10-30 LAB — WET PREP, GENITAL
Clue Cells Wet Prep HPF POC: NONE SEEN
Sperm: NONE SEEN
Trich, Wet Prep: NONE SEEN
Yeast Wet Prep HPF POC: NONE SEEN

## 2020-10-30 LAB — URINALYSIS, ROUTINE W REFLEX MICROSCOPIC
Bilirubin Urine: NEGATIVE
Glucose, UA: NEGATIVE mg/dL
Hgb urine dipstick: NEGATIVE
Ketones, ur: 80 mg/dL — AB
Leukocytes,Ua: NEGATIVE
Nitrite: NEGATIVE
Protein, ur: NEGATIVE mg/dL
Specific Gravity, Urine: 1.02 (ref 1.005–1.030)
pH: 6 (ref 5.0–8.0)
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
## 2020-10-31 ENCOUNTER — Ambulatory Visit (INDEPENDENT_AMBULATORY_CARE_PROVIDER_SITE_OTHER): Payer: Medicaid Other | Admitting: Obstetrics & Gynecology

## 2020-10-31 VITALS — BP 120/80 | Wt 245.0 lb

## 2020-10-31 DIAGNOSIS — O4692 Antepartum hemorrhage, unspecified, second trimester: Secondary | ICD-10-CM | POA: Diagnosis not present

## 2020-10-31 LAB — GC/CHLAMYDIA PROBE AMP (~~LOC~~) NOT AT ARMC
Chlamydia: NEGATIVE
Comment: NEGATIVE
Comment: NORMAL
Neisseria Gonorrhea: NEGATIVE

## 2020-10-31 NOTE — Telephone Encounter (Signed)
Patient was seen yesterday but doesn't know if she is still having the bleeding. Patient has reported back to work. Refused appointment for 11:30WI for today. Pending review from [facility].

## 2020-10-31 NOTE — Telephone Encounter (Signed)
Patient is scheduled for today at 11:30 WI
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
## 2020-11-08 ENCOUNTER — Ambulatory Visit: Payer: Medicaid Other

## 2020-11-11 ENCOUNTER — Encounter: Payer: Medicaid Other | Admitting: Obstetrics and Gynecology

## 2020-11-16 ENCOUNTER — Inpatient Hospital Stay (HOSPITAL_COMMUNITY)
Admission: AD | Admit: 2020-11-16 | Discharge: 2020-11-16 | Disposition: A | Discharge status: Home / Self Care | Payer: Medicaid Other | Attending: Obstetrics and Gynecology | Admitting: Obstetrics and Gynecology

## 2020-11-16 ENCOUNTER — Other Ambulatory Visit: Payer: Self-pay

## 2020-11-16 ENCOUNTER — Encounter (HOSPITAL_COMMUNITY): Payer: Self-pay | Admitting: Obstetrics and Gynecology

## 2020-11-16 DIAGNOSIS — Z888 Allergy status to other drugs, medicaments and biological substances status: Secondary | ICD-10-CM | POA: Diagnosis not present

## 2020-11-16 DIAGNOSIS — Z711 Person with feared health complaint in whom no diagnosis is made: Secondary | ICD-10-CM | POA: Insufficient documentation

## 2020-11-16 DIAGNOSIS — Z3490 Encounter for supervision of normal pregnancy, unspecified, unspecified trimester: Secondary | ICD-10-CM

## 2020-11-16 DIAGNOSIS — O368120 Decreased fetal movements, second trimester, not applicable or unspecified: Secondary | ICD-10-CM | POA: Insufficient documentation

## 2020-11-16 DIAGNOSIS — Z3A19 19 weeks gestation of pregnancy: Secondary | ICD-10-CM | POA: Diagnosis not present

## 2020-11-16 DIAGNOSIS — O368190 Decreased fetal movements, unspecified trimester, not applicable or unspecified: Secondary | ICD-10-CM

## 2020-11-16 LAB — URINALYSIS, ROUTINE W REFLEX MICROSCOPIC
Bilirubin Urine: NEGATIVE
Glucose, UA: 50 mg/dL — AB
Hgb urine dipstick: NEGATIVE
Ketones, ur: 20 mg/dL — AB
Nitrite: NEGATIVE
Protein, ur: 30 mg/dL — AB
Specific Gravity, Urine: 1.028 (ref 1.005–1.030)
pH: 6 (ref 5.0–8.0)
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
## 2020-11-16 NOTE — MAU Provider Note (Signed)
History     CSN: [id number]  Arrival date and time: 11/16/20 1813   Event Date/Time   First Provider Initiated Contact with Patient 11/16/20 1946      Chief Complaint  Patient presents with   Decreased Fetal Movement   HPI  [name] is a 30 y.o. female [phone] @ [redacted]w[redacted]d here with DFM. She spoke to Dr. Jean Rosenthal at [facility] who told her to be seen for DFM.   No abdominal pain or bleeding. No back pain. She feels occasional pressure in her pelvis but this is not new.   OB History     Gravida  4   Para  3   Term  3   Preterm      AB      Living  3      SAB      IAB      Ectopic      Multiple  0   Live Births  3           Past Medical History:  Diagnosis Date   Bicornate uterus    Complication of anesthesia    itching after C-Sections   Family history of adverse reaction to anesthesia    uncle has a hard time waking up   Genital HSV    GERD (gastroesophageal reflux disease)    Migraine    Obesity affecting pregnancy    BMI>40    Past Surgical History:  Procedure Laterality Date   CESAREAN SECTION  2012   for HSV outbreak   CESAREAN SECTION N/A 11/29/2015   Procedure: CESAREAN SECTION Baby Girl 7lb.[name];  Surgeon: Vena Austria, MD;  Location: [facility] ORS;  Service: Obstetrics;  Laterality: N/A;   CESAREAN SECTION N/A 12/18/2016   Procedure: CESAREAN SECTION;  Surgeon: Conard Novak, MD;  Location: [facility];  Service: Obstetrics;  Laterality: N/A;   ears Bilateral    tubes   ESOPHAGOGASTRODUODENOSCOPY (EGD) WITH PROPOFOL N/A 05/04/2016   Procedure: ESOPHAGOGASTRODUODENOSCOPY (EGD) WITH PROPOFOL;  Surgeon: Midge Minium, MD;  Location: [facility];  Service: Endoscopy;  Laterality: N/A;   HERNIA REPAIR  06/18/2019   STENT PLACEMENT ILIAC ([facility] HX)     TONSILLECTOMY      Family History  Problem Relation Age of Onset   Diabetes Maternal Aunt    Hyperlipidemia Maternal Aunt     Social History   Tobacco Use    Smoking status: Former    Types: Cigarettes    Quit date: 04/09/2013    Years since quitting: 7.6   Smokeless tobacco: Never  Vaping Use   Vaping Use: Never used  Substance Use Topics   Alcohol use: Not Currently    Comment: 2x/month, none during pregnacy   Drug use: No    Allergies:  Allergies  Allergen Reactions   Oxycodone-Acetaminophen Hives and Itching   Tape     Some tapes cause blisters   Fluticasone Swelling    Mouth swelling    Medications Prior to Admission  Medication Sig Dispense Refill Last Dose   cholecalciferol (VITAMIN D3) 25 MCG (1000 UNIT) tablet Take 2,000 Units by mouth daily.      Doxylamine-Pyridoxine (DICLEGIS) 10-10 MG TBEC Take 2 tablets by mouth at bedtime. If symptoms persist, add one tablet in the morning and one in the afternoon 100 tablet 5    ondansetron (ZOFRAN ODT) 4 MG disintegrating tablet Take 1 tablet (4 mg total) by mouth every 6 (six) hours as needed  for nausea. 20 tablet 0    Prenatal Vit-Fe Fumarate-FA (PRENATAL MULTIVITAMIN) TABS tablet Take 1 tablet by mouth daily at 12 noon. 2 Gummies a day      Results for orders placed or performed during the hospital encounter of 11/16/20 (from the past 48 hour(s))  Urinalysis, Routine w reflex microscopic Urine, Clean Catch     Status: Abnormal   Collection Time: 11/16/20  6:54 PM  Result Value Ref Range   Color, Urine AMBER (A) YELLOW    Comment: BIOCHEMICALS MAY BE AFFECTED BY COLOR   APPearance CLOUDY (A) CLEAR   Specific Gravity, Urine 1.028 1.005 - 1.030   pH 6.0 5.0 - 8.0   Glucose, UA 50 (A) NEGATIVE mg/dL   Hgb urine dipstick NEGATIVE NEGATIVE   Bilirubin Urine NEGATIVE NEGATIVE   Ketones, ur 20 (A) NEGATIVE mg/dL   Protein, ur 30 (A) NEGATIVE mg/dL   Nitrite NEGATIVE NEGATIVE   Leukocytes,Ua SMALL (A) NEGATIVE   RBC / HPF 0-5 0 - 5 RBC/hpf   WBC, UA 6-10 0 - 5 WBC/hpf   Bacteria, UA RARE (A) NONE SEEN   Squamous Epithelial / LPF 0-5 0 - 5   Mucus PRESENT     Comment: Performed  at [facility], [address], [place], [place] [address]    Review of Systems  Constitutional:  Negative for fever.  Gastrointestinal:  Negative for abdominal pain.  Genitourinary:  Negative for vaginal bleeding and vaginal discharge.  Musculoskeletal:  Negative for back pain.  Physical Exam   Blood pressure 116/61, pulse 80, temperature 98.2 F (36.8 C), temperature source Oral, resp. rate 16, height 5\' 5"  (1.651 m), weight 106.1 kg, last menstrual period 06/14/2020, SpO2 96 %.  Physical Exam Constitutional:      General: She is not in acute distress.    Appearance: Normal appearance. She is not ill-appearing, toxic-appearing or diaphoretic.  HENT:     Head: Normocephalic.  Abdominal:     Palpations: Abdomen is soft.     Tenderness: There is no abdominal tenderness.  Neurological:     Mental Status: She is alert and oriented to person, place, and time.  Psychiatric:        Mood and Affect: Mood normal.   MAU Course  Procedures  MDM  + fetal tones via doppler  Urine culture pending   Assessment and Plan   A:  1. Decreased fetal movement during pregnancy, antepartum, single or unspecified fetus   2. [redacted] weeks gestation of pregnancy   3. Presence of fetal heart sounds, antepartum   4. Worried well     P:  Discharge home in stable condition Return to MAU for OB emergencies F/u with OB as scheduled  Destanie Tibbetts, 08/14/2020, NP 11/16/2020 7:58 PM

## 2020-11-17 ENCOUNTER — Other Ambulatory Visit: Payer: Medicaid Other

## 2020-11-18 LAB — CULTURE, OB URINE: Culture: 60000 — AB

## 2020-11-22 ENCOUNTER — Other Ambulatory Visit: Payer: Self-pay | Admitting: Obstetrics & Gynecology

## 2020-11-22 ENCOUNTER — Ambulatory Visit (INDEPENDENT_AMBULATORY_CARE_PROVIDER_SITE_OTHER): Payer: Medicaid Other | Admitting: Obstetrics and Gynecology

## 2020-11-22 ENCOUNTER — Other Ambulatory Visit: Payer: Self-pay

## 2020-11-22 ENCOUNTER — Ambulatory Visit: Payer: Medicaid Other | Attending: Obstetrics

## 2020-11-22 VITALS — BP 118/76 | Wt 233.0 lb

## 2020-11-22 DIAGNOSIS — Z369 Encounter for antenatal screening, unspecified: Secondary | ICD-10-CM

## 2020-11-22 DIAGNOSIS — Z98891 History of uterine scar from previous surgery: Secondary | ICD-10-CM

## 2020-11-22 DIAGNOSIS — O34219 Maternal care for unspecified type scar from previous cesarean delivery: Secondary | ICD-10-CM

## 2020-11-22 DIAGNOSIS — O4692 Antepartum hemorrhage, unspecified, second trimester: Secondary | ICD-10-CM | POA: Insufficient documentation

## 2020-11-22 DIAGNOSIS — O99212 Obesity complicating pregnancy, second trimester: Secondary | ICD-10-CM

## 2020-11-22 DIAGNOSIS — T1490XA Injury, unspecified, initial encounter: Secondary | ICD-10-CM

## 2020-11-22 DIAGNOSIS — Z363 Encounter for antenatal screening for malformations: Secondary | ICD-10-CM | POA: Diagnosis present

## 2020-11-22 DIAGNOSIS — Q513 Bicornate uterus: Secondary | ICD-10-CM

## 2020-11-22 DIAGNOSIS — Z9884 Bariatric surgery status: Secondary | ICD-10-CM

## 2020-11-22 DIAGNOSIS — O99842 Bariatric surgery status complicating pregnancy, second trimester: Secondary | ICD-10-CM | POA: Insufficient documentation

## 2020-11-22 DIAGNOSIS — O9A212 Injury, poisoning and certain other consequences of external causes complicating pregnancy, second trimester: Secondary | ICD-10-CM | POA: Diagnosis not present

## 2020-11-22 DIAGNOSIS — O0990 Supervision of high risk pregnancy, unspecified, unspecified trimester: Secondary | ICD-10-CM

## 2020-11-22 DIAGNOSIS — O99210 Obesity complicating pregnancy, unspecified trimester: Secondary | ICD-10-CM

## 2020-11-22 DIAGNOSIS — Z3A20 20 weeks gestation of pregnancy: Secondary | ICD-10-CM

## 2020-11-22 DIAGNOSIS — Z3A21 21 weeks gestation of pregnancy: Secondary | ICD-10-CM

## 2020-11-22 DIAGNOSIS — O34592 Maternal care for other abnormalities of gravid uterus, second trimester: Secondary | ICD-10-CM | POA: Diagnosis not present

## 2020-11-22 NOTE — Progress Notes (Signed)
ROB - RM 4 °

## 2020-11-22 NOTE — Progress Notes (Signed)
Routine Prenatal Care Visit  Subjective  Sandy [name] is a 30 y.o. [phone] at [redacted]w[redacted]d being seen today for ongoing prenatal care.  She is currently monitored for the following issues for this low-risk pregnancy and has HSV-2 (herpes simplex virus 2) infection; Bicornuate uterus; CVA (cerebral vascular accident) (HCC); Maternal obesity, antepartum; ASCUS with positive high risk HPV cervical; Supervision of high risk pregnancy, antepartum; History of gastric bypass; and History of 3 cesarean sections on their problem list.  ----------------------------------------------------------------------------------- Patient reports no complaints.   Contractions: Not present. Vag. Bleeding: None.  Movement: Present. Denies leaking of fluid.  ----------------------------------------------------------------------------------- The following portions of the patient's history were reviewed and updated as appropriate: allergies, current medications, past family history, past medical history, past social history, past surgical history and problem list. Problem list updated.   Objective  Last menstrual period 06/14/2020. Pregravid weight 288 lb (130.6 kg) Total Weight Gain -55 lb (-24.9 kg)  Urinalysis:      Fetal Status: Fetal Heart Rate (bpm): 140   Movement: Present     General:  Alert, oriented and cooperative. Patient is in no acute distress.  Skin: Skin is warm and dry. No rash noted.   Cardiovascular: Normal heart rate noted  Respiratory: Normal respiratory effort, no problems with respiration noted  Abdomen: Soft, gravid, appropriate for gestational age. Pain/Pressure: Absent     Pelvic:  Cervical exam deferred        Extremities: Normal range of motion.     ental Status: Normal mood and affect. Normal behavior. Normal judgment and thought content.     Assessment   30 y.o. G4P3003 at [redacted]w[redacted]d by  04/06/2021, by Ultrasound presenting for routine prenatal visit  Plan   pregnancy4  Problems  (from 06/14/20 to present)     Problem Noted Resolved   Maternal morbid obesity, antepartum, first trimester (HCC) 08/17/2020 by Nadara Mustard, MD No   Overview Signed 08/17/2020  1:57 PM by Nadara Mustard, MD    BMI >=40 [x ] early fasting glc (no glucola due to recent gastric bypass)  [ ]  screen sleep apnea [ ]  anesthesia consult (early and late if BMI > 45) [ ]  u/s for dating [ ]   [x ] nutritional goals [x ] folic acid 1mg  [ ]  bASA (>12 weeks) [x ] consider nutrition consult [ ]  consider maternal EKG 1st trimester [ ]  Growth u/s 28 [ ] , 32 [ ] , 36 weeks [ ]  [ ]  NST/AFI weekly 34+ weeks (34[] ,35[] ,36[] , 37[] , 38[] )      High-risk pregnancy, first trimester 08/17/2020 by , MD No   Overview Addendum 09/08/2020  8:44 AM by , MD     Nursing Staff Provider  Office Location  [facility] Dating   6 wk  Language  English Anatomy    Flu Vaccine   Genetic Screen  NIPS:   TDaP vaccine    Hgb A1C or  GTT Early : Third trimester :   Covid    LAB RESULTS   Rhogam   Blood Type     Feeding Plan  Antibody    Contraception  Rubella    Circumcision  RPR     Pediatrician   HBsAg     Support Person  HIV    Prenatal Classes  Varicella     GBS  (For PCN allergy, check sensitivities)   BTL Consent     VBAC Consent  Pap  2022    Hgb  Electro      CF      SMA                History of gastric bypass 08/17/2020 by Nadara Mustard, MD No   Overview Signed 08/17/2020  1:53 PM by Nadara Mustard, MD    Vitamin use counseled Avoid glucola    Alternative screening for GDM discussed    Pt has never had GDM, thus is low risk           Gestational age appropriate obstetric precautions including but not limited to vaginal bleeding, contractions, leaking of fluid and fetal movement were reviewed in detail with the patient.    Anatomy scan scheduled later today  Return in about 4 weeks (around 12/20/2020) for ROB 4 week, ROB 8 weeks.  Vena Austria, MD, [facility], [facility] 11/22/2020, 2:17 PM

## 2020-11-23 ENCOUNTER — Telehealth: Payer: Self-pay

## 2020-11-23 NOTE — Telephone Encounter (Signed)
-----   Message from Vena Austria, MD sent at 11/23/2020 12:10 PM EDT ----- Regarding: NOB labs Has not had NOB labs drawn order placed

## 2020-11-23 NOTE — Telephone Encounter (Signed)
Called and left voicemail for patient to call back to be scheduled. 

## 2020-11-24 NOTE — Telephone Encounter (Signed)
Spoke to [employer], sent message to front office to schedule.

## 2020-11-24 NOTE — Telephone Encounter (Signed)
Sent message to front office to schedule appt.

## 2020-12-16 ENCOUNTER — Other Ambulatory Visit: Payer: Self-pay | Admitting: Obstetrics

## 2020-12-16 DIAGNOSIS — O99212 Obesity complicating pregnancy, second trimester: Secondary | ICD-10-CM

## 2020-12-16 DIAGNOSIS — O3402 Maternal care for unspecified congenital malformation of uterus, second trimester: Secondary | ICD-10-CM

## 2020-12-16 DIAGNOSIS — O99842 Bariatric surgery status complicating pregnancy, second trimester: Secondary | ICD-10-CM

## 2020-12-16 DIAGNOSIS — Z8759 Personal history of other complications of pregnancy, childbirth and the puerperium: Secondary | ICD-10-CM

## 2020-12-16 DIAGNOSIS — Z98891 History of uterine scar from previous surgery: Secondary | ICD-10-CM

## 2020-12-20 ENCOUNTER — Ambulatory Visit (INDEPENDENT_AMBULATORY_CARE_PROVIDER_SITE_OTHER): Payer: Medicaid Other | Admitting: Advanced Practice Midwife

## 2020-12-20 ENCOUNTER — Other Ambulatory Visit: Payer: Self-pay

## 2020-12-20 ENCOUNTER — Ambulatory Visit: Payer: Medicaid Other | Attending: Maternal & Fetal Medicine

## 2020-12-20 VITALS — BP 118/70 | Ht 65.5 in | Wt 233.3 lb

## 2020-12-20 VITALS — BP 102/67 | HR 74 | Temp 98.1°F | Ht 65.5 in | Wt 232.5 lb

## 2020-12-20 DIAGNOSIS — O9A219 Injury, poisoning and certain other consequences of external causes complicating pregnancy, unspecified trimester: Secondary | ICD-10-CM | POA: Diagnosis not present

## 2020-12-20 DIAGNOSIS — Z8759 Personal history of other complications of pregnancy, childbirth and the puerperium: Secondary | ICD-10-CM | POA: Insufficient documentation

## 2020-12-20 DIAGNOSIS — Z9884 Bariatric surgery status: Secondary | ICD-10-CM

## 2020-12-20 DIAGNOSIS — Z3A24 24 weeks gestation of pregnancy: Secondary | ICD-10-CM

## 2020-12-20 DIAGNOSIS — Q513 Bicornate uterus: Secondary | ICD-10-CM | POA: Insufficient documentation

## 2020-12-20 DIAGNOSIS — O99210 Obesity complicating pregnancy, unspecified trimester: Secondary | ICD-10-CM

## 2020-12-20 DIAGNOSIS — O99842 Bariatric surgery status complicating pregnancy, second trimester: Secondary | ICD-10-CM | POA: Diagnosis not present

## 2020-12-20 DIAGNOSIS — O3402 Maternal care for unspecified congenital malformation of uterus, second trimester: Secondary | ICD-10-CM

## 2020-12-20 DIAGNOSIS — O4692 Antepartum hemorrhage, unspecified, second trimester: Secondary | ICD-10-CM | POA: Diagnosis not present

## 2020-12-20 DIAGNOSIS — O34592 Maternal care for other abnormalities of gravid uterus, second trimester: Secondary | ICD-10-CM | POA: Diagnosis not present

## 2020-12-20 DIAGNOSIS — O34219 Maternal care for unspecified type scar from previous cesarean delivery: Secondary | ICD-10-CM

## 2020-12-20 DIAGNOSIS — O09293 Supervision of pregnancy with other poor reproductive or obstetric history, third trimester: Secondary | ICD-10-CM

## 2020-12-20 DIAGNOSIS — O99212 Obesity complicating pregnancy, second trimester: Secondary | ICD-10-CM | POA: Diagnosis present

## 2020-12-20 DIAGNOSIS — Z98891 History of uterine scar from previous surgery: Secondary | ICD-10-CM | POA: Insufficient documentation

## 2020-12-20 DIAGNOSIS — O0990 Supervision of high risk pregnancy, unspecified, unspecified trimester: Secondary | ICD-10-CM

## 2020-12-20 NOTE — Progress Notes (Signed)
Routine Prenatal Care Visit  Subjective  Sandy [name] is a 30 y.o. [phone] at [redacted]w[redacted]d being seen today for ongoing prenatal care.  She is currently monitored for the following issues for this high-risk pregnancy and has HSV-2 (herpes simplex virus 2) infection; Bicornuate uterus; CVA (cerebral vascular accident) (HCC); Maternal obesity, antepartum; ASCUS with positive high risk HPV cervical; Supervision of high risk pregnancy, antepartum; History of 3 cesarean sections; History of Roux-en-Y gastric bypass; and Postgastrectomy malabsorption on their problem list.  ----------------------------------------------------------------------------------- Patient reports no complaints.  Reviewed follow up growth scan from earlier today, wnl per MFM. She has not yet had NOB panel drawn. Unable to do today due to time of visit. She will plan to arrive early to next visit for lab draw. Also will need glucometer ordered at that visit for 2 weeks of glucose testing- unable to do gtt due to history of gastric bypass.   Contractions: Irregular. Vag. Bleeding: None.  Movement: Present. Leaking Fluid denies.  ----------------------------------------------------------------------------------- The following portions of the patient's history were reviewed and updated as appropriate: allergies, current medications, past family history, past medical history, past social history, past surgical history and problem list. Problem list updated.  Objective  Blood pressure 118/70, height 5' 5.5" (1.664 m), weight 233 lb 4.8 oz (105.8 kg), last menstrual period 06/14/2020. Pregravid weight 288 lb (130.6 kg) Total Weight Gain -54 lb 11.2 oz (-24.8 kg) Urinalysis: Urine Protein    Urine Glucose    Fetal Status: Fetal Heart Rate (bpm): 147 Fundal Height: 25 cm Movement: Present     General:  Alert, oriented and cooperative. Patient is in no acute distress.  Skin: Skin is warm and dry. No rash noted.   Cardiovascular: Normal  heart rate noted  Respiratory: Normal respiratory effort, no problems with respiration noted  Abdomen: Soft, gravid, appropriate for gestational age. Pain/Pressure: Present     Pelvic:  Cervical exam deferred        Extremities: Normal range of motion.  Edema: None  Mental Status: Normal mood and affect. Normal behavior. Normal judgment and thought content.   Assessment   30 y.o. G4P3003 at [redacted]w[redacted]d by  04/06/2021, by Ultrasound presenting for routine prenatal visit  Plan   pregnancy4  Problems (from 06/14/20 to present)    Problem Noted Resolved   Maternal obesity, antepartum 08/17/2020 by Nadara Mustard, MD No   Overview Signed 08/17/2020  1:57 PM by Nadara Mustard, MD    BMI >=40 [x ] early fasting glc (no glucola due to recent gastric bypass)  [ ]  screen sleep apnea [ ]  anesthesia consult (early and late if BMI > 45) [ ]  u/s for dating [ ]   [x ] nutritional goals [x ] folic acid 1mg  [ ]  bASA (>12 weeks) [x ] consider nutrition consult [ ]  consider maternal EKG 1st trimester [ ]  Growth u/s 28 [ ] , 32 [ ] , 36 weeks [ ]  [ ]  NST/AFI weekly 34+ weeks (34[] ,35[] ,36[] , 37[] , 38[] )      Supervision of high risk pregnancy, antepartum 08/17/2020 by , MD No   Overview Addendum 11/23/2020 12:06 PM by , MD     Nursing Staff Provider  Office Location  [facility] Dating   6 wk  Language  English Anatomy  Complete  Flu Vaccine   Genetic Screen  NIPS:   TDaP vaccine    Hgb A1C or  GTT Early : Third trimester :   Covid    LAB RESULTS  Rhogam   Blood Type     Feeding Plan  Antibody    Contraception  Rubella    Circumcision  RPR     Pediatrician   HBsAg     Support Person  HIV    Prenatal Classes  Varicella     GBS  (For PCN allergy, check sensitivities)   BTL Consent     VBAC Consent  Pap  2022    Hgb Electro      CF      SMA         History recent roux-en-y (monthly MFM growth scans) Bicornuate uterus History placental abrupation         History of gastric bypass 08/17/2020 by Nadara Mustard, MD 12/20/2020 by Tresea Mall, CNM   Overview Signed 08/17/2020  1:53 PM by Nadara Mustard, MD    Vitamin use counseled Avoid glucola    Alternative screening for GDM discussed    Pt has never had GDM, thus is low risk          Preterm labor symptoms and general obstetric precautions including but not limited to vaginal bleeding, contractions, leaking of fluid and fetal movement were reviewed in detail with the patient.   Return for scheduled appointment.  Tresea Mall, CNM 12/20/2020 5:09 PM

## 2020-12-22 ENCOUNTER — Encounter: Payer: Medicaid Other | Admitting: Obstetrics & Gynecology

## 2020-12-29 ENCOUNTER — Other Ambulatory Visit: Payer: Self-pay

## 2020-12-29 ENCOUNTER — Encounter: Payer: Self-pay | Admitting: Obstetrics and Gynecology

## 2020-12-29 ENCOUNTER — Observation Stay
Admission: EM | Admit: 2020-12-29 | Discharge: 2020-12-30 | Disposition: A | Discharge status: Home / Self Care | Payer: Medicaid Other | Attending: Obstetrics and Gynecology | Admitting: Obstetrics and Gynecology

## 2020-12-29 ENCOUNTER — Other Ambulatory Visit: Payer: Self-pay | Admitting: Obstetrics and Gynecology

## 2020-12-29 DIAGNOSIS — O26892 Other specified pregnancy related conditions, second trimester: Secondary | ICD-10-CM

## 2020-12-29 DIAGNOSIS — Z3A25 25 weeks gestation of pregnancy: Secondary | ICD-10-CM

## 2020-12-29 DIAGNOSIS — O99282 Endocrine, nutritional and metabolic diseases complicating pregnancy, second trimester: Secondary | ICD-10-CM

## 2020-12-29 DIAGNOSIS — O99210 Obesity complicating pregnancy, unspecified trimester: Secondary | ICD-10-CM

## 2020-12-29 DIAGNOSIS — R42 Dizziness and giddiness: Secondary | ICD-10-CM | POA: Diagnosis not present

## 2020-12-29 DIAGNOSIS — O26812 Pregnancy related exhaustion and fatigue, second trimester: Principal | ICD-10-CM | POA: Insufficient documentation

## 2020-12-29 DIAGNOSIS — E878 Other disorders of electrolyte and fluid balance, not elsewhere classified: Secondary | ICD-10-CM | POA: Diagnosis not present

## 2020-12-29 DIAGNOSIS — Z3A26 26 weeks gestation of pregnancy: Secondary | ICD-10-CM | POA: Insufficient documentation

## 2020-12-29 DIAGNOSIS — O0990 Supervision of high risk pregnancy, unspecified, unspecified trimester: Secondary | ICD-10-CM

## 2020-12-29 LAB — CBC
HCT: 33.1 % — ABNORMAL LOW (ref 36.0–46.0)
Hemoglobin: 12 g/dL (ref 12.0–15.0)
MCH: 34 pg (ref 26.0–34.0)
MCHC: 36.3 g/dL — ABNORMAL HIGH (ref 30.0–36.0)
MCV: 93.8 fL (ref 80.0–100.0)
Platelets: 160 10*3/uL (ref 150–400)
RBC: 3.53 MIL/uL — ABNORMAL LOW (ref 3.87–5.11)
RDW: 13.2 % (ref 11.5–15.5)
WBC: 7.8 10*3/uL (ref 4.0–10.5)
nRBC: 0 % (ref 0.0–0.2)

## 2020-12-29 LAB — COMPREHENSIVE METABOLIC PANEL
ALT: 11 U/L (ref 0–44)
AST: 13 U/L — ABNORMAL LOW (ref 15–41)
Albumin: 2.7 g/dL — ABNORMAL LOW (ref 3.5–5.0)
Alkaline Phosphatase: 76 U/L (ref 38–126)
Anion gap: 5 (ref 5–15)
BUN: 5 mg/dL — ABNORMAL LOW (ref 6–20)
CO2: 25 mmol/L (ref 22–32)
Calcium: 8.4 mg/dL — ABNORMAL LOW (ref 8.9–10.3)
Chloride: 104 mmol/L (ref 98–111)
Creatinine, Ser: 0.43 mg/dL — ABNORMAL LOW (ref 0.44–1.00)
GFR, Estimated: >60 mL/min (ref >60)
Glucose, Bld: 90 mg/dL (ref 70–99)
Potassium: 3.3 mmol/L — ABNORMAL LOW (ref 3.5–5.1)
Sodium: 134 mmol/L — ABNORMAL LOW (ref 135–145)
Total Bilirubin: 1 mg/dL (ref 0.3–1.2)
Total Protein: 5.5 g/dL — ABNORMAL LOW (ref 6.5–8.1)

## 2020-12-29 LAB — PHOSPHORUS: Phosphorus: 3.4 mg/dL (ref 2.5–4.6)

## 2020-12-29 LAB — MAGNESIUM: Magnesium: 1.9 mg/dL (ref 1.7–2.4)

## 2020-12-29 MED ORDER — MECLIZINE HCL 25 MG PO TABS
25.0000 mg | ORAL_TABLET | Freq: Two times a day (BID) | ORAL | Status: DC
  Administered 2020-12-29: 25 mg via ORAL
  Filled 2020-12-29: qty 1

## 2020-12-29 MED ORDER — ACCU-CHEK FASTCLIX LANCET KIT: 1.0000 | PACK | Freq: Four times a day (QID) | 11 refills | Status: DC

## 2020-12-29 MED ORDER — LACTATED RINGERS IV BOLUS
500.0000 mL | Freq: Once | INTRAVENOUS | Status: AC
  Administered 2020-12-29: 500 mL via INTRAVENOUS

## 2020-12-29 MED ORDER — LACTATED RINGERS IV BOLUS
500.0000 mL | Freq: Once | INTRAVENOUS | Status: DC
Start: 2020-12-30 — End: 2020-12-30

## 2020-12-29 MED ORDER — MECLIZINE HCL 25 MG PO TABS: 25.0000 mg | ORAL_TABLET | Freq: Two times a day (BID) | ORAL | 2 refills | Status: DC | PRN

## 2020-12-29 MED ORDER — ACCU-CHEK GUIDE VI STRP: ORAL_STRIP | 12 refills | Status: DC

## 2020-12-29 MED ORDER — POTASSIUM CHLORIDE 10 MEQ/100ML IV SOLN
10.0000 meq | INTRAVENOUS | Status: AC
  Administered 2020-12-30 (×4): 10 meq via INTRAVENOUS
  Filled 2020-12-29 (×4): qty 100

## 2020-12-29 MED ORDER — ACCU-CHEK GUIDE W/DEVICE KIT: 1.0000 | PACK | Freq: Four times a day (QID) | 0 refills | Status: DC

## 2020-12-29 NOTE — Progress Notes (Signed)
Referral order placed for ENT secondary to dizziness

## 2020-12-29 NOTE — OB Triage Note (Signed)
Pt is a 30y/o G4P3 at [redacted]w[redacted]d with c/o dizziness for the past week but worsening but got worse yesterday with 3 episodes of dizziness and today had the dizziness, resulting in the fall. Pt states +FM. Pt denies LOF, CTX and VB. Monitors applied and assessing. Initial FHT 135.

## 2020-12-29 NOTE — Discharge Summary (Signed)
Physician Discharge Summary  Patient ID: Sandy [name] MRN: [id number] DOB/AGE: 29-31-1992 29 y.o.  Admit date: 12/29/2020 Discharge date: 12/30/2020  Admission Diagnoses: Dizziness  Discharge Diagnoses:  Active Problems:   Dizziness  Discharged Condition: good  Hospital Course:  She presents today to triage with complaints of dizziness.  She reports that for the last week she has been having worsening dizziness.  She reports that she will have 3 or more attacks of dizzy spells throughout the day.  Today she had a dizzy spell which lasted for 45 minutes.  She fell on her bottom and on her knees when she was trying to get up as part of this dizzy spell.  She reports that she is experiencing the room spinning.  She has some blurring of her vision during the spells.  She denies any hearing changes.  She reports that the dizzy spells will come on spontaneously and unpredictably.  She reports that she can be sitting when the spells happen.  Did they do not seem to be tied to what she is doing or what she is eating and drinking.  She has a history of a Roux-en-Y.  This is a recent surgery that she had in February 2022 prior to becoming pregnant.  She reports that since the surgery she has only been able to eat and drink in small amounts.  She tries to stay hydrated with multiple bottles of water a day.  She had significant nausea early in pregnancy but reports that her nausea has improved.  She was taking Diclegis just to control her symptoms of nausea but has now been able to discontinue that medicine.  She was noted to have vitamin D deficiency earlier this pregnancy.  She is not tolerating oral vitamin D replacements.    Laboratory values today for CBC CMP and electrolytes.  Will hydrate with IV fluids. Potassium was replaced overnight.   She reports that she does not have a primary care physician.  Discussed possible etiologies of dizziness.  Will refer patient to ENT for further evaluation.   Encourage patient to continue with adequate hydration and consider wearing compression socks.  Her prenatal lab work has not been completed.  She reports that there is a plan to complete this lab work on 01/19/2021 when she comes for an office visit.  I will sent a prescription for her to the pharmacy so that she can start checking her blood glucose values over the next 2 weeks to monitor for gestational diabetes.  Nursing was able to teach the patient how to check her blood sugars.  Patient was feeling well in the AM. Electrolyte abnormalities had resolved with fluids and potassium replacements. She was discharged home in stable condition.   Consults: None  Significant Diagnostic Studies: labs: SEE EPIC  Treatments: IV hydration  Discharge Exam: Blood pressure 114/61, pulse 80, temperature 98.2 F (36.8 C), temperature source Oral, resp. rate 16, last menstrual period 06/14/2020. General appearance: alert, cooperative, appears stated age, and no distress Resp: clear to auscultation bilaterally Cardio: regular rate and rhythm, S1, S2 normal, no murmur, click, rub or gallop Extremities: extremities normal, atraumatic, no cyanosis or edema Skin: Skin color, texture, turgor normal. No rashes or lesions  Disposition:  Discharge disposition: 01-Home or Self Care       Allergies as of 12/30/2020       Reactions   Oxycodone-acetaminophen Hives, Itching   Fluticasone Swelling   Mouth swelling        Medication List  TAKE these medications    Accu-Chek FastClix Lancet Kit 1 each by Does not apply route in the morning, at noon, in the evening, and at bedtime.   Accu-Chek Guide test strip Generic drug: glucose blood Check blood glucose 4 times a day   Accu-Chek Guide w/Device Kit 1 each by Does not apply route 4 (four) times daily.   cholecalciferol 25 MCG (1000 UNIT) tablet Commonly known as: VITAMIN D3 Take 2,000 Units by mouth daily.   Doxylamine-Pyridoxine 10-10  MG Tbec Commonly known as: Diclegis Take 2 tablets by mouth at bedtime. If symptoms persist, add one tablet in the morning and one in the afternoon   meclizine 25 MG tablet Commonly known as: ANTIVERT Take 1 tablet (25 mg total) by mouth 2 (two) times daily as needed for dizziness.   ondansetron 4 MG disintegrating tablet Commonly known as: Zofran ODT Take 1 tablet (4 mg total) by mouth every 6 (six) hours as needed for nausea.   prenatal multivitamin Tabs tablet Take 1 tablet by mouth daily at 12 noon. 2 Gummies a day         Signed: Finch Costanzo R Kenika Sahm 12/30/2020, 7:48 AM

## 2020-12-30 DIAGNOSIS — O26812 Pregnancy related exhaustion and fatigue, second trimester: Secondary | ICD-10-CM | POA: Diagnosis not present

## 2020-12-30 LAB — ELECTROLYTE PANEL
Anion gap: 7 (ref 5–15)
CO2: 25 mmol/L (ref 22–32)
Chloride: 104 mmol/L (ref 98–111)
Potassium: 3.8 mmol/L (ref 3.5–5.1)
Sodium: 136 mmol/L (ref 135–145)

## 2020-12-30 MED ORDER — SODIUM CHLORIDE 0.9 % IV SOLN: INTRAVENOUS | Status: DC

## 2021-01-03 ENCOUNTER — Telehealth: Payer: Self-pay

## 2021-01-03 NOTE — Telephone Encounter (Signed)
-----   Message from Lewis Moccasin sent at 01/02/2021  4:29 PM EDT ----- Regarding: Labs Per Dr. Jerene Pitch, please call patient and schedule a lab visit for NOB panel, hgbA1C, and B12 level. She would like these to be done now, and not wait for her next visit. Thanks.

## 2021-01-03 NOTE — Telephone Encounter (Signed)
Called and left voicemail for patient to call back to be scheduled. 

## 2021-01-04 NOTE — Telephone Encounter (Signed)
Patient refused to be scheduled here for labs. If I could print the orders out I could have her come by and pick them up.

## 2021-01-06 ENCOUNTER — Other Ambulatory Visit: Payer: Medicaid Other

## 2021-01-06 ENCOUNTER — Other Ambulatory Visit: Payer: Self-pay | Admitting: Maternal & Fetal Medicine

## 2021-01-06 DIAGNOSIS — O99213 Obesity complicating pregnancy, third trimester: Secondary | ICD-10-CM

## 2021-01-06 DIAGNOSIS — Z98891 History of uterine scar from previous surgery: Secondary | ICD-10-CM

## 2021-01-06 DIAGNOSIS — Z9884 Bariatric surgery status: Secondary | ICD-10-CM

## 2021-01-06 DIAGNOSIS — O3403 Maternal care for unspecified congenital malformation of uterus, third trimester: Secondary | ICD-10-CM

## 2021-01-17 ENCOUNTER — Other Ambulatory Visit: Payer: Self-pay

## 2021-01-19 ENCOUNTER — Ambulatory Visit (INDEPENDENT_AMBULATORY_CARE_PROVIDER_SITE_OTHER): Payer: Medicaid Other | Admitting: Obstetrics and Gynecology

## 2021-01-19 ENCOUNTER — Ambulatory Visit: Payer: Medicaid Other | Attending: Obstetrics and Gynecology

## 2021-01-19 ENCOUNTER — Other Ambulatory Visit: Payer: Self-pay

## 2021-01-19 ENCOUNTER — Other Ambulatory Visit: Payer: Medicaid Other

## 2021-01-19 ENCOUNTER — Other Ambulatory Visit: Payer: Self-pay | Admitting: Obstetrics & Gynecology

## 2021-01-19 VITALS — BP 126/69 | HR 91 | Temp 98.5°F | Ht 65.0 in | Wt 226.5 lb

## 2021-01-19 VITALS — BP 126/68 | Wt 228.0 lb

## 2021-01-19 DIAGNOSIS — E669 Obesity, unspecified: Secondary | ICD-10-CM | POA: Insufficient documentation

## 2021-01-19 DIAGNOSIS — O3403 Maternal care for unspecified congenital malformation of uterus, third trimester: Secondary | ICD-10-CM | POA: Diagnosis not present

## 2021-01-19 DIAGNOSIS — N858 Other specified noninflammatory disorders of uterus: Secondary | ICD-10-CM | POA: Insufficient documentation

## 2021-01-19 DIAGNOSIS — Z87828 Personal history of other (healed) physical injury and trauma: Secondary | ICD-10-CM | POA: Insufficient documentation

## 2021-01-19 DIAGNOSIS — Z3A29 29 weeks gestation of pregnancy: Secondary | ICD-10-CM | POA: Diagnosis not present

## 2021-01-19 DIAGNOSIS — O99210 Obesity complicating pregnancy, unspecified trimester: Secondary | ICD-10-CM

## 2021-01-19 DIAGNOSIS — Q513 Bicornate uterus: Secondary | ICD-10-CM | POA: Diagnosis not present

## 2021-01-19 DIAGNOSIS — O99213 Obesity complicating pregnancy, third trimester: Secondary | ICD-10-CM | POA: Diagnosis present

## 2021-01-19 DIAGNOSIS — Z9884 Bariatric surgery status: Secondary | ICD-10-CM

## 2021-01-19 DIAGNOSIS — O34219 Maternal care for unspecified type scar from previous cesarean delivery: Secondary | ICD-10-CM

## 2021-01-19 DIAGNOSIS — Z98891 History of uterine scar from previous surgery: Secondary | ICD-10-CM

## 2021-01-19 DIAGNOSIS — O0990 Supervision of high risk pregnancy, unspecified, unspecified trimester: Secondary | ICD-10-CM

## 2021-01-19 DIAGNOSIS — O99843 Bariatric surgery status complicating pregnancy, third trimester: Secondary | ICD-10-CM | POA: Diagnosis not present

## 2021-01-19 DIAGNOSIS — Z369 Encounter for antenatal screening, unspecified: Secondary | ICD-10-CM

## 2021-01-19 NOTE — Progress Notes (Signed)
    Routine Prenatal Care Visit  Subjective  Sandy [name] is a 30 y.o. [phone] at [redacted]w[redacted]d being seen today for ongoing prenatal care.  She is currently monitored for the following issues for this high-risk pregnancy and has HSV-2 (herpes simplex virus 2) infection; Bicornuate uterus; CVA (cerebral vascular accident) (HCC); Maternal obesity, antepartum; ASCUS with positive high risk HPV cervical; Supervision of high risk pregnancy, antepartum; History of 3 cesarean sections; History of Roux-en-Y gastric bypass; Postgastrectomy malabsorption; and Dizziness on their problem list.  ----------------------------------------------------------------------------------- Patient reports no complaints.   Contractions: Not present. Vag. Bleeding: None.  Movement: Present. Denies leaking of fluid.  ----------------------------------------------------------------------------------- The following portions of the patient's history were reviewed and updated as appropriate: allergies, current medications, past family history, past medical history, past social history, past surgical history and problem list. Problem list updated.   Objective  Blood pressure 126/68, weight 228 lb (103.4 kg), last menstrual period 06/14/2020. Pregravid weight 288 lb (130.6 kg) Total Weight Gain -60 lb (-27.2 kg) Urinalysis:      Fetal Status: Fetal Heart Rate (bpm): 140   Movement: Present     General:  Alert, oriented and cooperative. Patient is in no acute distress.  Skin: Skin is warm and dry. No rash noted.   Cardiovascular: Normal heart rate noted  Respiratory: Normal respiratory effort, no problems with respiration noted  Abdomen: Soft, gravid, appropriate for gestational age. Pain/Pressure: Absent     Pelvic:  Cervical exam deferred        Extremities: Normal range of motion.     ental Status: Normal mood and affect. Normal behavior. Normal judgment and thought content.     Assessment   30 y.o. [id number] at [redacted]w[redacted]d by   04/06/2021, by Ultrasound presenting for routine prenatal visit  Plan   Gestational age appropriate obstetric precautions including but not limited to vaginal bleeding, contractions, leaking of fluid and fetal movement were reviewed in detail with the patient.    Return in about 2 weeks (around 02/02/2021) for ROB.  Vena Austria, MD, [facility], [facility] 01/19/2021, 4:47 PM

## 2021-01-19 NOTE — Progress Notes (Signed)
ROB - no concerns. RM 6 

## 2021-01-20 LAB — COMPREHENSIVE METABOLIC PANEL
ALT: 10 IU/L (ref 0–32)
AST: 13 IU/L (ref 0–40)
Albumin/Globulin Ratio: 1.8 (ref 1.2–2.2)
Albumin: 3.3 g/dL — ABNORMAL LOW (ref 3.9–5.0)
Alkaline Phosphatase: 103 IU/L (ref 44–121)
BUN/Creatinine Ratio: 9 (ref 9–23)
BUN: 4 mg/dL — ABNORMAL LOW (ref 6–20)
Bilirubin Total: 0.7 mg/dL (ref 0.0–1.2)
CO2: 21 mmol/L (ref 20–29)
Calcium: 8.2 mg/dL — ABNORMAL LOW (ref 8.7–10.2)
Chloride: 104 mmol/L (ref 96–106)
Creatinine, Ser: 0.44 mg/dL — ABNORMAL LOW (ref 0.57–1.00)
Globulin, Total: 1.8 g/dL (ref 1.5–4.5)
Glucose: 101 mg/dL — ABNORMAL HIGH (ref 70–99)
Potassium: 3.4 mmol/L — ABNORMAL LOW (ref 3.5–5.2)
Sodium: 136 mmol/L (ref 134–144)
Total Protein: 5.1 g/dL — ABNORMAL LOW (ref 6.0–8.5)
eGFR: 134 mL/min/{1.73_m2} (ref >59)

## 2021-01-20 LAB — RPR+RH+ABO+RUB AB+AB SCR+CB...
Antibody Screen: NEGATIVE
HIV Screen 4th Generation wRfx: NONREACTIVE
Hematocrit: 33.1 % — ABNORMAL LOW (ref 34.0–46.6)
Hemoglobin: 11.9 g/dL (ref 11.1–15.9)
Hepatitis B Surface Ag: NEGATIVE
MCH: 34 pg — ABNORMAL HIGH (ref 26.6–33.0)
MCHC: 36 g/dL — ABNORMAL HIGH (ref 31.5–35.7)
MCV: 95 fL (ref 79–97)
Platelets: 146 10*3/uL — ABNORMAL LOW (ref 150–450)
RBC: 3.5 x10E6/uL — ABNORMAL LOW (ref 3.77–5.28)
RDW: 12.1 % (ref 11.7–15.4)
RPR Ser Ql: NONREACTIVE
Rh Factor: POSITIVE
Rubella Antibodies, IGG: <0.9 index — ABNORMAL LOW (ref >0.99)
Varicella zoster IgG: 1493 index (ref >165)
WBC: 6.2 10*3/uL (ref 3.4–10.8)

## 2021-01-20 LAB — CBC
Hematocrit: 33.8 % — ABNORMAL LOW (ref 34.0–46.6)
Hemoglobin: 12.1 g/dL (ref 11.1–15.9)
MCH: 33.8 pg — ABNORMAL HIGH (ref 26.6–33.0)
MCHC: 35.8 g/dL — ABNORMAL HIGH (ref 31.5–35.7)
MCV: 94 fL (ref 79–97)
Platelets: 148 10*3/uL — ABNORMAL LOW (ref 150–450)
RBC: 3.58 x10E6/uL — ABNORMAL LOW (ref 3.77–5.28)
RDW: 11.7 % (ref 11.7–15.4)
WBC: 6.2 10*3/uL (ref 3.4–10.8)

## 2021-01-20 LAB — HEMOGLOBIN A1C
Est. average glucose Bld gHb Est-mCnc: <74 mg/dL
Hgb A1c MFr Bld: <4.2 % — ABNORMAL LOW (ref 4.8–5.6)

## 2021-01-20 LAB — VITAMIN B12: Vitamin B-12: 317 pg/mL (ref 232–1245)

## 2021-01-20 LAB — VITAMIN D 25 HYDROXY (VIT D DEFICIENCY, FRACTURES): Vit D, 25-Hydroxy: 27.7 ng/mL — ABNORMAL LOW (ref 30.0–100.0)

## 2021-01-31 ENCOUNTER — Observation Stay
Admission: EM | Admit: 2021-01-31 | Discharge: 2021-01-31 | Disposition: A | Discharge status: Home / Self Care | Payer: Medicaid Other | Attending: Obstetrics & Gynecology | Admitting: Obstetrics & Gynecology

## 2021-01-31 ENCOUNTER — Other Ambulatory Visit: Payer: Self-pay

## 2021-01-31 DIAGNOSIS — O34219 Maternal care for unspecified type scar from previous cesarean delivery: Secondary | ICD-10-CM

## 2021-01-31 DIAGNOSIS — O99843 Bariatric surgery status complicating pregnancy, third trimester: Secondary | ICD-10-CM

## 2021-01-31 DIAGNOSIS — O368130 Decreased fetal movements, third trimester, not applicable or unspecified: Principal | ICD-10-CM | POA: Insufficient documentation

## 2021-01-31 DIAGNOSIS — O0990 Supervision of high risk pregnancy, unspecified, unspecified trimester: Secondary | ICD-10-CM

## 2021-01-31 DIAGNOSIS — Z87891 Personal history of nicotine dependence: Secondary | ICD-10-CM | POA: Diagnosis not present

## 2021-01-31 DIAGNOSIS — Z3490 Encounter for supervision of normal pregnancy, unspecified, unspecified trimester: Secondary | ICD-10-CM

## 2021-01-31 DIAGNOSIS — Z3A31 31 weeks gestation of pregnancy: Secondary | ICD-10-CM | POA: Insufficient documentation

## 2021-01-31 DIAGNOSIS — O99210 Obesity complicating pregnancy, unspecified trimester: Secondary | ICD-10-CM

## 2021-01-31 NOTE — OB Triage Note (Signed)
Sandy [name] is a 30yo G4P3, 30w 5d. She arrived to the unit with complaints of  vaginal pressure, decreased fetal movement, vaginal discharge. She denies vaginal bleeding. VS stable, monitors applied and assessing.   Initial FHT 145 at 1933.

## 2021-01-31 NOTE — Progress Notes (Signed)
Discharge instructions provided to pt. Pt verbalizes understanding. Vaginal bleeding and discharge, contractions, and fetal movement reviewed by RN. Pt discharged home.

## 2021-02-02 NOTE — Discharge Summary (Signed)
  See FPN 

## 2021-02-02 NOTE — Final Progress Note (Signed)
Physician Final Progress Note  Patient ID: Sandy [name] MRN: [id number] DOB/AGE: July 22, 1990 30 y.o.  Admit date: 01/31/2021 Admitting provider: Gae Dry, MD Discharge date: 02/02/2021  Admission Diagnoses: Decreased fetal movement  Discharge Diagnoses:  Active Problems:   Pregnancy   Same     31 weeks pregnancy  Consults: None  Significant Findings/ Diagnostic Studies:   Sandy [name] is a 30 y.o. female. She is at 34w0dgestation. She has noted decreased fetal movement for the last 8  hours .  Also at work w some crampiness noted at end of shift. She denies bleeding, contractions, cramping or leaking. Her pregnancy has been complicated by: BMO, prior CSx3, prior gastric bypass.  ROS: A review of systems was performed and negative, except as stated in the above HPI.  OBGYN History: As per HPI. OB History     Gravida  4   Para  3   Term  3   Preterm      AB      Living  3      SAB      IAB      Ectopic      Multiple  0   Live Births  3            Past Medical History: Past Medical History:  Diagnosis Date   Bicornate uterus    Complication of anesthesia    itching after C-Sections   Family history of adverse reaction to anesthesia    uncle has a hard time waking up   Genital HSV    GERD (gastroesophageal reflux disease)    Migraine    Obesity affecting pregnancy    BMI>40    Past Surgical History: Past Surgical History:  Procedure Laterality Date   CESAREAN SECTION  2012   for HSV outbreak   CESAREAN SECTION N/A 11/29/2015   Procedure: CESAREAN SECTION Baby Girl 7lb.,Isidoro Donning;  Surgeon: AMalachy Mood MD;  Location: [facility];  Service: Obstetrics;  Laterality: N/A;   CESAREAN SECTION N/A 12/18/2016   Procedure: CESAREAN SECTION;  Surgeon: JWill Bonnet MD;  Location: [facility];  Service: Obstetrics;  Laterality: N/A;   ears Bilateral    tubes   ESOPHAGOGASTRODUODENOSCOPY (EGD) WITH PROPOFOL N/A 05/04/2016   Procedure:  ESOPHAGOGASTRODUODENOSCOPY (EGD) WITH PROPOFOL;  Surgeon: DLucilla Lame MD;  [facility]  Service: Endoscopy;  Laterality: N/A;   HERNIA REPAIR  06/18/2019   STENT PLACEMENT ILIAC ([facility] HX)     TONSILLECTOMY      Family History:  Family History  Problem Relation Age of Onset   Diabetes Maternal Aunt    Hyperlipidemia Maternal Aunt    She denies any female cancers, bleeding or blood clotting disorders.   Social History:  Social History   Socioeconomic History   Marital status: Widowed    Spouse name: Not on file   Number of children: Not on file   Years of education: Not on file   Highest education level: Not on file  Occupational History   Not on file  Tobacco Use   Smoking status: Former    Types: Cigarettes    Quit date: 04/09/2013    Years since quitting: 7.8   Smokeless tobacco: Never  Vaping Use   Vaping Use: Never used  Substance and Sexual Activity   Alcohol use: Not Currently    Comment: 2x/month, none during pregnacy   Drug use: No   Sexual activity: Yes    Birth  control/protection: Pill  Other Topics Concern   Not on file  Social History Narrative   Not on file   Social Determinants of Health   Financial Resource Strain: Not on file  Food Insecurity: Not on file  Transportation Needs: Not on file  Physical Activity: Not on file  Stress: Not on file  Social Connections: Not on file  Intimate Partner Violence: Not on file    Allergy: Allergies  Allergen Reactions   Oxycodone-Acetaminophen Hives and Itching   Fluticasone Swelling    Mouth swelling    Medications:  No medications prior to admission.    Plan:   As NST is reactive, no further pain at this time, and no evidence for PTL, will follow as outpatient w prenatal care  Patient expresses understanding of information provided and plan of care.    Procedures: A NST procedure was performed with FHR monitoring and a normal baseline established, appropriate time of 20-40  minutes of evaluation, and accels >2 seen w 15x15 characteristics.  Results show a REACTIVE NST.   Discharge Condition: good  Disposition: Discharge disposition: 01-Home or Self Care       Diet: Regular diet  Discharge Activity: Activity as tolerated   Allergies as of 01/31/2021       Reactions   Oxycodone-acetaminophen Hives, Itching   Fluticasone Swelling   Mouth swelling        Medication List     ASK your doctor about these medications    Accu-Chek FastClix Lancet Kit 1 each by Does not apply route in the morning, at noon, in the evening, and at bedtime.   Accu-Chek Guide test strip Generic drug: glucose blood Check blood glucose 4 times a day   Accu-Chek Guide w/Device Kit 1 each by Does not apply route 4 (four) times daily.   cholecalciferol 25 MCG (1000 UNIT) tablet Commonly known as: VITAMIN D3 Take 2,000 Units by mouth daily.   Doxylamine-Pyridoxine 10-10 MG Tbec Commonly known as: Diclegis Take 2 tablets by mouth at bedtime. If symptoms persist, add one tablet in the morning and one in the afternoon   meclizine 25 MG tablet Commonly known as: ANTIVERT Take 1 tablet (25 mg total) by mouth 2 (two) times daily as needed for dizziness.   ondansetron 4 MG disintegrating tablet Commonly known as: Zofran ODT Take 1 tablet (4 mg total) by mouth every 6 (six) hours as needed for nausea.   prenatal multivitamin Tabs tablet Take 1 tablet by mouth daily at 12 noon. 2 Gummies a day         Total time spent taking care of this patient: TRIAGE/ NST  Signed: Hoyt Koch 02/02/2021, 7:28 AM

## 2021-02-03 ENCOUNTER — Ambulatory Visit (INDEPENDENT_AMBULATORY_CARE_PROVIDER_SITE_OTHER): Payer: Medicaid Other | Admitting: Obstetrics and Gynecology

## 2021-02-03 ENCOUNTER — Encounter: Payer: Self-pay | Admitting: Obstetrics and Gynecology

## 2021-02-03 ENCOUNTER — Other Ambulatory Visit: Payer: Self-pay

## 2021-02-03 VITALS — BP 116/70 | Ht 65.5 in | Wt 226.4 lb

## 2021-02-03 DIAGNOSIS — Z23 Encounter for immunization: Secondary | ICD-10-CM

## 2021-02-03 DIAGNOSIS — Z9884 Bariatric surgery status: Secondary | ICD-10-CM

## 2021-02-03 DIAGNOSIS — O0990 Supervision of high risk pregnancy, unspecified, unspecified trimester: Secondary | ICD-10-CM

## 2021-02-03 DIAGNOSIS — O24415 Gestational diabetes mellitus in pregnancy, controlled by oral hypoglycemic drugs: Secondary | ICD-10-CM

## 2021-02-03 MED ORDER — METFORMIN HCL 500 MG PO TABS: 500.0000 mg | ORAL_TABLET | Freq: Two times a day (BID) | ORAL | 5 refills | Status: DC

## 2021-02-03 MED ORDER — FREESTYLE LIBRE 14 DAY SENSOR MISC: 1.0000 | 6 refills | Status: DC

## 2021-02-03 MED ORDER — FREESTYLE LIBRE 14 DAY READER DEVI: 1.0000 | 6 refills | Status: DC

## 2021-02-03 NOTE — Patient Instructions (Signed)

## 2021-02-03 NOTE — Progress Notes (Signed)
Routine Prenatal Care Visit  Subjective  Sandy [name] is a 30 y.o. [phone] at [redacted]w[redacted]d being seen today for ongoing prenatal care.  She is currently monitored for the following issues for this high-risk pregnancy and has HSV-2 (herpes simplex virus 2) infection; Bicornuate uterus; CVA (cerebral vascular accident) (HCC); Maternal obesity, antepartum; ASCUS with positive high risk HPV cervical; Supervision of high risk pregnancy, antepartum; History of 3 cesarean sections; History of Roux-en-Y gastric bypass; Postgastrectomy malabsorption; Dizziness; and Pregnancy on their problem list.  ----------------------------------------------------------------------------------- Patient reports no complaints.   Contractions: Not present. Vag. Bleeding: None.  Movement: Present. Denies leaking of fluid.  ----------------------------------------------------------------------------------- The following portions of the patient's history were reviewed and updated as appropriate: allergies, current medications, past family history, past medical history, past social history, past surgical history and problem list. Problem list updated.   Objective  Blood pressure 116/70, height 5' 5.5" (1.664 m), weight 226 lb 6.4 oz (102.7 kg), last menstrual period 06/14/2020. Pregravid weight 288 lb (130.6 kg) Total Weight Gain -61 lb 9.6 oz (-27.9 kg) Urinalysis:      Fetal Status: Fetal Heart Rate (bpm): 140   Movement: Present     General:  Alert, oriented and cooperative. Patient is in no acute distress.  Skin: Skin is warm and dry. No rash noted.   Cardiovascular: Normal heart rate noted  Respiratory: Normal respiratory effort, no problems with respiration noted  Abdomen: Soft, gravid, appropriate for gestational age. Pain/Pressure: Present     Pelvic:  Cervical exam deferred        Extremities: Normal range of motion.     Mental Status: Normal mood and affect. Normal behavior. Normal judgment and thought  content.     Assessment   30 y.o. G4P3003 at [redacted]w[redacted]d by  04/06/2021, by Ultrasound presenting for routine prenatal visit  Plan   pregnancy4  Problems (from 06/14/20 to present)     Problem Noted Resolved   Maternal obesity, antepartum 08/17/2020 by Nadara Mustard, MD No   Overview Signed 08/17/2020  1:57 PM by Nadara Mustard, MD    BMI >=40 [x ] early fasting glc (no glucola due to recent gastric bypass)  [ ]  screen sleep apnea [ ]  anesthesia consult (early and late if BMI > 45) [ ]  u/s for dating [ ]   [x ] nutritional goals [x ] folic acid 1mg  [ ]  bASA (>12 weeks) [x ] consider nutrition consult [ ]  consider maternal EKG 1st trimester [ ]  Growth u/s 28 [ ] , 32 [ ] , 36 weeks [ ]  [ ]  NST/AFI weekly 34+ weeks (34[] ,35[] ,36[] , 37[] , 38[] )      Supervision of high risk pregnancy, antepartum 08/17/2020 by , MD No   Overview Addendum 11/23/2020 12:06 PM by , MD     Nursing Staff Provider  Office Location  [facility] Dating   6 wk  Language  English Anatomy  Complete  Flu Vaccine   Genetic Screen  NIPS:   TDaP vaccine    Hgb A1C or  GTT Early : Third trimester :   Covid    LAB RESULTS   Rhogam   Blood Type     Feeding Plan  Antibody    Contraception  Rubella    Circumcision  RPR     Pediatrician   HBsAg     Support Person  HIV    Prenatal Classes  Varicella     GBS  (For PCN allergy, check sensitivities)  BTL Consent     VBAC Consent  Pap  2022    Hgb Electro      CF      SMA        History recent roux-en-y (monthly MFM growth scans) Bicornuate uterus History placental abrupation        History of gastric bypass 08/17/2020 by Nadara Mustard, MD 12/20/2020 by Tresea Mall, CNM   Overview Signed 08/17/2020  1:53 PM by Nadara Mustard, MD    Vitamin use counseled Avoid glucola    Alternative screening for GDM discussed    Pt has never had GDM, thus is low risk          Patient hgba1c is low, but she has significantly  elevated glucose values on her finger stick checks. Reports when it is higher in the 190s she generally is not feeling well. Wonder if she is yo-yo ing between hypoglycemia and hyperglycemia. Recommended continuous glucose monitor. Consider starting metformin if having continuously high glucose levels. Referral to nutrtion placed.  TDAP today.    Gestational age appropriate obstetric precautions including but not limited to vaginal bleeding, contractions, leaking of fluid and fetal movement were reviewed in detail with the patient.    Return in about 1 week (around 02/10/2021) for ROB in person.  Natale Milch MD [facility], [facility] 02/03/2021, 8:35 AM

## 2021-02-07 ENCOUNTER — Ambulatory Visit (INDEPENDENT_AMBULATORY_CARE_PROVIDER_SITE_OTHER): Payer: Medicaid Other | Admitting: Obstetrics & Gynecology

## 2021-02-07 ENCOUNTER — Encounter: Payer: Self-pay | Admitting: Obstetrics & Gynecology

## 2021-02-07 ENCOUNTER — Other Ambulatory Visit: Payer: Self-pay

## 2021-02-07 VITALS — BP 110/70 | Wt 226.0 lb

## 2021-02-07 DIAGNOSIS — O0993 Supervision of high risk pregnancy, unspecified, third trimester: Secondary | ICD-10-CM

## 2021-02-07 DIAGNOSIS — Q513 Bicornate uterus: Secondary | ICD-10-CM

## 2021-02-07 DIAGNOSIS — Z9884 Bariatric surgery status: Secondary | ICD-10-CM

## 2021-02-07 DIAGNOSIS — O99210 Obesity complicating pregnancy, unspecified trimester: Secondary | ICD-10-CM

## 2021-02-07 LAB — POCT URINALYSIS DIPSTICK OB
Glucose, UA: NEGATIVE
POC,PROTEIN,UA: NEGATIVE

## 2021-02-07 NOTE — Patient Instructions (Signed)

## 2021-02-07 NOTE — Progress Notes (Signed)
  Subjective  Fetal Movement? yes Contractions? no Leaking Fluid? no Vaginal Bleeding? no Most BS normal or low (50-60s)    No weight gain this pregnancy    Following diet mostly per post gastric bypass guidelines    No edema Objective  BP 110/70   Wt 226 lb (102.5 kg)   LMP 06/14/2020   BMI 37.04 kg/m  General: NAD Pumonary: no increased work of breathing Abdomen: gravid, non-tender Extremities: no edema Psychiatric: mood appropriate, affect full  Assessment  30 y.o. [id number] at [redacted]w[redacted]d by  04/06/2021, by Ultrasound presenting for routine prenatal visit  Plan   Problem List Items Addressed This Visit      Genitourinary   Bicornuate uterus     Other   Maternal obesity, antepartum   Supervision of high risk pregnancy, antepartum - Primary   History of Roux-en-Y gastric bypass  PNV, Potassium, Diet Cont blood sugar checks, do not take Metformin Growth [place] next week (MFM) Planning CS  pregnancy4  Problems (from 06/14/20 to present)    Problem Noted Resolved   Maternal obesity, antepartum 08/17/2020 by Nadara Mustard, MD No   Overview Signed 08/17/2020  1:57 PM by Nadara Mustard, MD    BMI >=40 [x ] early fasting glc (no glucola due to recent gastric bypass)  [ ]  screen sleep apnea [ ]  anesthesia consult (early and late if BMI > 45) [ ]  u/s for dating [ ]   [x ] nutritional goals [x ] folic acid 1mg  [ ]  bASA (>12 weeks) [x ] consider nutrition consult [ ]  consider maternal EKG 1st trimester [ ]  Growth u/s 28 [ ] , 32 [ ] , 36 weeks [ ]  [ ]  NST/AFI weekly 34+ weeks (34[] ,35[] ,36[] , 37[] , 38[] )      Supervision of high risk pregnancy, antepartum 08/17/2020 by , MD No   Overview Addendum 02/03/2021  8:46 AM by , MD     Nursing Staff Provider  Office Location  [facility] Dating   6 wk  Language  English Anatomy  Complete  Flu Vaccine   Genetic Screen  NIPS:   TDaP vaccine   02/03/2021 Hgb A1C or  GTT Early : Third trimester  : 4.2  Covid    LAB RESULTS   Rhogam   Blood Type A/Positive/-- (10/13 1013)   Feeding Plan  Antibody Negative (10/13 1013)  Contraception  Rubella <0.90 (10/13 1013)  Circumcision  RPR Non Reactive (10/13 1013)   Pediatrician   HBsAg Negative (10/13 1013)   Support Person  HIV Non Reactive (10/13 1013)  Prenatal Classes  Varicella     GBS  (For PCN allergy, check sensitivities)   BTL Consent     VBAC Consent  Pap  2022    Hgb Electro      CF      SMA         History recent roux-en-y (monthly MFM growth scans) Bicornuate uterus History placental abrupation        History of gastric bypass 08/17/2020 by Natale Milch, MD 12/20/2020 by [place], CNM   Overview Signed 08/17/2020  1:53 PM by [birth year]-1978, MD    Vitamin use counseled Avoid glucola    Alternative screening for GDM discussed    Pt has never had GDM, thus is low risk          01-03-1977, MD, 01-03-1978 Ob/Gyn, [facility] 02/07/2021  8:58 AM

## 2021-02-14 ENCOUNTER — Other Ambulatory Visit: Payer: Self-pay

## 2021-02-14 DIAGNOSIS — B009 Herpesviral infection, unspecified: Secondary | ICD-10-CM

## 2021-02-14 DIAGNOSIS — O0990 Supervision of high risk pregnancy, unspecified, unspecified trimester: Secondary | ICD-10-CM

## 2021-02-14 DIAGNOSIS — Q513 Bicornate uterus: Secondary | ICD-10-CM

## 2021-02-14 DIAGNOSIS — Z98891 History of uterine scar from previous surgery: Secondary | ICD-10-CM

## 2021-02-14 DIAGNOSIS — Z9884 Bariatric surgery status: Secondary | ICD-10-CM

## 2021-02-14 DIAGNOSIS — O99210 Obesity complicating pregnancy, unspecified trimester: Secondary | ICD-10-CM

## 2021-02-16 ENCOUNTER — Other Ambulatory Visit: Payer: Self-pay

## 2021-02-16 ENCOUNTER — Ambulatory Visit: Payer: Medicaid Other | Attending: Maternal & Fetal Medicine

## 2021-02-16 ENCOUNTER — Ambulatory Visit (HOSPITAL_BASED_OUTPATIENT_CLINIC_OR_DEPARTMENT_OTHER): Payer: Medicaid Other | Admitting: Maternal & Fetal Medicine

## 2021-02-16 VITALS — BP 106/67 | HR 105 | Temp 98.0°F | Ht 65.0 in | Wt 226.0 lb

## 2021-02-16 DIAGNOSIS — O3403 Maternal care for unspecified congenital malformation of uterus, third trimester: Secondary | ICD-10-CM | POA: Insufficient documentation

## 2021-02-16 DIAGNOSIS — O99210 Obesity complicating pregnancy, unspecified trimester: Secondary | ICD-10-CM

## 2021-02-16 DIAGNOSIS — O34219 Maternal care for unspecified type scar from previous cesarean delivery: Secondary | ICD-10-CM | POA: Diagnosis not present

## 2021-02-16 DIAGNOSIS — B009 Herpesviral infection, unspecified: Secondary | ICD-10-CM | POA: Diagnosis not present

## 2021-02-16 DIAGNOSIS — O0993 Supervision of high risk pregnancy, unspecified, third trimester: Secondary | ICD-10-CM | POA: Insufficient documentation

## 2021-02-16 DIAGNOSIS — Z3A33 33 weeks gestation of pregnancy: Secondary | ICD-10-CM

## 2021-02-16 DIAGNOSIS — Z87828 Personal history of other (healed) physical injury and trauma: Secondary | ICD-10-CM | POA: Insufficient documentation

## 2021-02-16 DIAGNOSIS — N858 Other specified noninflammatory disorders of uterus: Secondary | ICD-10-CM | POA: Insufficient documentation

## 2021-02-16 DIAGNOSIS — O99843 Bariatric surgery status complicating pregnancy, third trimester: Secondary | ICD-10-CM | POA: Insufficient documentation

## 2021-02-16 DIAGNOSIS — O0990 Supervision of high risk pregnancy, unspecified, unspecified trimester: Secondary | ICD-10-CM

## 2021-02-16 DIAGNOSIS — Z9884 Bariatric surgery status: Secondary | ICD-10-CM

## 2021-02-16 DIAGNOSIS — Q513 Bicornate uterus: Secondary | ICD-10-CM | POA: Insufficient documentation

## 2021-02-16 DIAGNOSIS — Z98891 History of uterine scar from previous surgery: Secondary | ICD-10-CM

## 2021-02-16 DIAGNOSIS — O99213 Obesity complicating pregnancy, third trimester: Secondary | ICD-10-CM | POA: Diagnosis present

## 2021-02-16 NOTE — Progress Notes (Unsigned)
.  mfm

## 2021-02-16 NOTE — Progress Notes (Signed)
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 She is seen at the request of Velora Mediate.  Positive interval growth was observed today with an EFW of 81%   Good fetal movement and amniotic fluid was observed.  Ms. Dercole did not perform a 1 hr GTT given the increased risk for dumping syndrome.  She instead has checked her FBS and 2hr pp.  I reviewed her log she has an avg blood sugar between 80 and 103 mg/dL.  She does have an occasional 140-150 mg/dL between 6 and 9 pm. However, she has recorded some low's in the 50's-60's.   At this time no additional therapy should be added. I encouraged her have snacks around her bed time and to monitor carbohydrates at dinner time. If her blood sugar is of more concern consider ordering a hgbA1c to ensure she is within range.  She is scheduled to return in 4 weeks for growth.  All questions answered   I spent 20 minutes with > 50% in face to face consultation.  Novella Olive, MD.

## 2021-02-17 ENCOUNTER — Other Ambulatory Visit: Payer: Self-pay

## 2021-02-17 ENCOUNTER — Encounter: Payer: Self-pay | Admitting: Emergency Medicine

## 2021-02-17 ENCOUNTER — Ambulatory Visit
Admission: EM | Admit: 2021-02-17 | Discharge: 2021-02-17 | Disposition: A | Discharge status: Home / Self Care | Payer: Medicaid Other | Attending: Emergency Medicine | Admitting: Emergency Medicine

## 2021-02-17 DIAGNOSIS — Z3A33 33 weeks gestation of pregnancy: Secondary | ICD-10-CM | POA: Diagnosis not present

## 2021-02-17 DIAGNOSIS — O99713 Diseases of the skin and subcutaneous tissue complicating pregnancy, third trimester: Secondary | ICD-10-CM

## 2021-02-17 DIAGNOSIS — L03012 Cellulitis of left finger: Secondary | ICD-10-CM

## 2021-02-17 MED ORDER — CEPHALEXIN 500 MG PO CAPS: 500.0000 mg | ORAL_CAPSULE | Freq: Four times a day (QID) | ORAL | 0 refills | Status: AC
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
## 2021-02-17 NOTE — Discharge Instructions (Signed)
Recommend removing acrylic, S&S nails: You are at risk for infection.  Take Keflex as directed.  Follow-up with your PCP.

## 2021-02-17 NOTE — ED Triage Notes (Signed)
Patient reports swelling , pain and redness in her left 2nd fingertip that started 3 days ago.  Patient reports the redness has started to spread down her finger.   Patient states that she is [redacted] weeks pregnant.

## 2021-02-18 ENCOUNTER — Ambulatory Visit: Payer: Medicaid Other

## 2021-02-22 ENCOUNTER — Ambulatory Visit (INDEPENDENT_AMBULATORY_CARE_PROVIDER_SITE_OTHER): Payer: Medicaid Other | Admitting: Obstetrics and Gynecology

## 2021-02-22 ENCOUNTER — Encounter: Payer: Self-pay | Admitting: Obstetrics and Gynecology

## 2021-02-22 ENCOUNTER — Other Ambulatory Visit: Payer: Self-pay

## 2021-02-22 VITALS — BP 114/68 | Wt 227.2 lb

## 2021-02-22 DIAGNOSIS — O0990 Supervision of high risk pregnancy, unspecified, unspecified trimester: Secondary | ICD-10-CM

## 2021-02-22 DIAGNOSIS — Z3A33 33 weeks gestation of pregnancy: Secondary | ICD-10-CM

## 2021-02-22 NOTE — Progress Notes (Signed)
Routine Prenatal Care Visit  Subjective  Sandy [name] is a 30 y.o. [phone] at [redacted]w[redacted]d being seen today for ongoing prenatal care.  She is currently monitored for the following issues for this high-risk pregnancy and has HSV-2 (herpes simplex virus 2) infection; Bicornuate uterus; CVA (cerebral vascular accident) (HCC); Maternal obesity, antepartum; ASCUS with positive high risk HPV cervical; Supervision of high risk pregnancy, antepartum; History of 3 cesarean sections; History of Roux-en-Y gastric bypass; Postgastrectomy malabsorption; Dizziness; and Pregnancy on their problem list.  ----------------------------------------------------------------------------------- Patient reports no complaints.   Contractions: Not present. Vag. Bleeding: None.  Movement: Present. Denies leaking of fluid.  ----------------------------------------------------------------------------------- The following portions of the patient's history were reviewed and updated as appropriate: allergies, current medications, past family history, past medical history, past social history, past surgical history and problem list. Problem list updated.   Objective  Blood pressure 114/68, weight 227 lb 3.2 oz (103.1 kg), last menstrual period 06/14/2020. Pregravid weight 288 lb (130.6 kg) Total Weight Gain -60 lb 12.8 oz (-27.6 kg) Urinalysis:      Fetal Status: Fetal Heart Rate (bpm): 130   Movement: Present     General:  Alert, oriented and cooperative. Patient is in no acute distress.  Skin: Skin is warm and dry. No rash noted.   Cardiovascular: Normal heart rate noted  Respiratory: Normal respiratory effort, no problems with respiration noted  Abdomen: Soft, gravid, appropriate for gestational age. Pain/Pressure: Present     Pelvic:  Cervical exam deferred        Extremities: Normal range of motion.  Edema: None  Mental Status: Normal mood and affect. Normal behavior. Normal judgment and thought content.      Assessment   30 y.o. G4P3003 at [redacted]w[redacted]d by  04/06/2021, by Ultrasound presenting for routine prenatal visit  Plan   pregnancy4  Problems (from 06/14/20 to present)     Problem Noted Resolved   Maternal obesity, antepartum 08/17/2020 by Nadara Mustard, MD No   Overview Addendum 02/07/2021  9:01 AM by Nadara Mustard, MD    BMI >=40 [x ] early fasting glc (no glucola due to recent gastric bypass)  [ ]  screen sleep apnea [ ]  anesthesia consult (early and late if BMI > 45) [ ]  u/s for dating [ ]   [x ] nutritional goals [x ] folic acid 1mg  [ ]  bASA (>12 weeks) [x ] consider nutrition consult [ ]  consider maternal EKG 1st trimester [x]  Growth u/s 35 [x ], 25 [x ], 36 weeks [ ]  [x ] NST/AFI weekly 34+ weeks (34[] ,35[] ,36[] , 37[] , 38[] )      Supervision of high risk pregnancy, antepartum 08/17/2020 by , MD No   Overview Addendum 02/22/2021  8:40 AM by , MD     Nursing Staff Provider  Office Location  [facility] Dating   6 wk  Language  English Anatomy  Complete  Flu Vaccine  Declines Genetic Screen  NIPS:nml XX   TDaP vaccine   02/03/2021 Hgb A1C or  GTT Third trimester : 4.2  Covid unvaccinated   LAB RESULTS   Rhogam  n/a Blood Type A/Positive/-- (10/13 1013)   Feeding Plan bottle Antibody Negative (10/13 1013)  Contraception Pop then ocp-after 6 weeks  Rubella <0.90 (10/13 1013)  Circumcision n/a RPR Non Reactive (10/13 1013)   Pediatrician   HBsAg Negative (10/13 1013)   Support Person Husband HIV Non Reactive (10/13 1013)  Prenatal Classes no Varicella  GBS  (For PCN allergy, check sensitivities)   BTL Consent no    VBAC Consent no Pap  2022       Plans R CS 03/30/21      History recent roux-en-y (monthly MFM growth scans) Bicornuate uterus History placental abrupation        History of gastric bypass 08/17/2020 by Gae Dry, MD 12/20/2020 by Rod Can, CNM   Overview Signed 08/17/2020  1:53 PM by Gae Dry, MD    Vitamin use counseled Avoid glucola    Alternative screening for GDM discussed    Pt has never had GDM, thus is low risk           Gestational age appropriate obstetric precautions including but not limited to vaginal bleeding, contractions, leaking of fluid and fetal movement were reviewed in detail with the patient.    Return in about 2 weeks (around 03/08/2021) for ROB in person.  Homero Fellers MD [facility] 02/22/2021, 8:40 AM

## 2021-03-01 ENCOUNTER — Telehealth: Payer: Self-pay

## 2021-03-01 ENCOUNTER — Observation Stay
Admission: EM | Admit: 2021-03-01 | Discharge: 2021-03-01 | Disposition: A | Discharge status: Home / Self Care | Payer: Medicaid Other | Attending: Obstetrics and Gynecology | Admitting: Obstetrics and Gynecology

## 2021-03-01 ENCOUNTER — Other Ambulatory Visit: Payer: Self-pay | Admitting: Obstetrics

## 2021-03-01 ENCOUNTER — Other Ambulatory Visit: Payer: Self-pay

## 2021-03-01 DIAGNOSIS — E669 Obesity, unspecified: Secondary | ICD-10-CM | POA: Insufficient documentation

## 2021-03-01 DIAGNOSIS — O26893 Other specified pregnancy related conditions, third trimester: Secondary | ICD-10-CM | POA: Diagnosis not present

## 2021-03-01 DIAGNOSIS — R8271 Bacteriuria: Secondary | ICD-10-CM

## 2021-03-01 DIAGNOSIS — Z6841 Body Mass Index (BMI) 40.0 and over, adult: Secondary | ICD-10-CM | POA: Diagnosis not present

## 2021-03-01 DIAGNOSIS — Z7984 Long term (current) use of oral hypoglycemic drugs: Secondary | ICD-10-CM | POA: Diagnosis not present

## 2021-03-01 DIAGNOSIS — Z87891 Personal history of nicotine dependence: Secondary | ICD-10-CM | POA: Diagnosis not present

## 2021-03-01 DIAGNOSIS — Z3A34 34 weeks gestation of pregnancy: Secondary | ICD-10-CM | POA: Insufficient documentation

## 2021-03-01 DIAGNOSIS — R109 Unspecified abdominal pain: Secondary | ICD-10-CM | POA: Diagnosis present

## 2021-03-01 DIAGNOSIS — R1030 Lower abdominal pain, unspecified: Secondary | ICD-10-CM | POA: Insufficient documentation

## 2021-03-01 DIAGNOSIS — O99213 Obesity complicating pregnancy, third trimester: Secondary | ICD-10-CM | POA: Diagnosis not present

## 2021-03-01 DIAGNOSIS — O99891 Other specified diseases and conditions complicating pregnancy: Secondary | ICD-10-CM

## 2021-03-01 LAB — URINALYSIS, COMPLETE (UACMP) WITH MICROSCOPIC
Bilirubin Urine: NEGATIVE
Glucose, UA: NEGATIVE mg/dL
Ketones, ur: 80 mg/dL — AB
Nitrite: NEGATIVE
Protein, ur: 30 mg/dL — AB
Specific Gravity, Urine: 1.017 (ref 1.005–1.030)
WBC, UA: >50 WBC/hpf — ABNORMAL HIGH (ref 0–5)
pH: 6 (ref 5.0–8.0)

## 2021-03-01 MED ORDER — TERBUTALINE SULFATE 1 MG/ML IJ SOLN: 0.2500 mg | Freq: Once | INTRAMUSCULAR | Status: AC

## 2021-03-01 MED ORDER — LACTATED RINGERS IV SOLN: INTRAVENOUS | Status: DC

## 2021-03-01 MED ORDER — NITROFURANTOIN MONOHYD MACRO 100 MG PO CAPS: 100.0000 mg | ORAL_CAPSULE | Freq: Two times a day (BID) | ORAL | 1 refills | Status: DC

## 2021-03-01 MED ORDER — TERBUTALINE SULFATE 1 MG/ML IJ SOLN
INTRAMUSCULAR | Status: AC
  Administered 2021-03-01: 0.25 mg via SUBCUTANEOUS
  Filled 2021-03-01: qty 1

## 2021-03-01 NOTE — Final Progress Note (Signed)
Final Progress Note  Patient ID: Sandy [name] MRN: [id number] DOB/AGE: 29/02/1991 29 y.o.  Admit date: 03/01/2021 Admitting provider: Malachy Mood, MD Discharge date: 03/01/2021   Admission Diagnoses: irregular uterine contractions  Discharge Diagnoses:  Principal Problem:   Abdominal pain in pregnancy, third trimester  Preterm onset of contractions resolved  History of Present Illness: The patient is a 30 y.o. female [phone] at 27w6dwho presents for evaluation  of lower abdominal pain that started earlier in the day while she was at work.she has been having some incional pain  (hx of three previous C sections), and today felt her abdomen tightening in a pattern that increased in frequency this afternoon. She denies any vaginal bleeding, recent intercourse or LOF.  Her baby has been moving well. Due to her Hx of gastric bypass, she does not drink a lot of water daily.she is presently 34 weeks 6 days gestation and has a planned repeat CS in one month.   Past Medical History:  Diagnosis Date   Bicornate uterus    Complication of anesthesia    itching after C-Sections   Family history of adverse reaction to anesthesia    uncle has a hard time waking up   Genital HSV    GERD (gastroesophageal reflux disease)    Migraine    Obesity affecting pregnancy    BMI>40    Past Surgical History:  Procedure Laterality Date   CESAREAN SECTION  2012   for HSV outbreak   CESAREAN SECTION N/A 11/29/2015   Procedure: CESAREAN SECTION Baby Girl 7lb.,Isidoro Donning;  Surgeon: AMalachy Mood MD;  Location: [facility] ORS;  Service: Obstetrics;  Laterality: N/A;   CESAREAN SECTION N/A 12/18/2016   Procedure: CESAREAN SECTION;  Surgeon: JWill Bonnet MD;  Location: [facility] ORS;  Service: Obstetrics;  Laterality: N/A;   ears Bilateral    tubes   ESOPHAGOGASTRODUODENOSCOPY (EGD) WITH PROPOFOL N/A 05/04/2016   Procedure: ESOPHAGOGASTRODUODENOSCOPY (EGD) WITH PROPOFOL;  Surgeon: DLucilla Lame MD;  Location:  [facility]  Service: Endoscopy;  Laterality: N/A;   HERNIA REPAIR  06/18/2019   STENT PLACEMENT ILIAC ([facility] HX)     TONSILLECTOMY      No current facility-administered medications on file prior to encounter.   Current Outpatient Medications on File Prior to Encounter  Medication Sig Dispense Refill   Blood Glucose Monitoring Suppl (ACCU-CHEK GUIDE) w/Device KIT 1 each by Does not apply route 4 (four) times daily. 1 kit 0   cholecalciferol (VITAMIN D3) 25 MCG (1000 UNIT) tablet Take 2,000 Units by mouth daily.     Continuous Blood Gluc Receiver (FREESTYLE LIBRE 14 DAY READER) DEVI 1 each by Does not apply route every 14 (fourteen) days. 1 each 6   Continuous Blood Gluc Sensor (FREESTYLE LIBRE 14 DAY SENSOR) MISC 1 each by Does not apply route every 14 (fourteen) days. 2 each 6   Doxylamine-Pyridoxine (DICLEGIS) 10-10 MG TBEC Take 2 tablets by mouth at bedtime. If symptoms persist, add one tablet in the morning and one in the afternoon 100 tablet 5   glucose blood (ACCU-CHEK GUIDE) test strip Check blood glucose 4 times a day 100 each 12   Lancets Misc. (ACCU-CHEK FASTCLIX LANCET) KIT 1 each by Does not apply route in the morning, at noon, in the evening, and at bedtime. 1 kit 11   meclizine (ANTIVERT) 25 MG tablet Take 1 tablet (25 mg total) by mouth 2 (two) times daily as needed for dizziness. 60 tablet 2   metFORMIN (GLUCOPHAGE)  500 MG tablet Take 1 tablet (500 mg total) by mouth 2 (two) times daily with a meal. 60 tablet 5   ondansetron (ZOFRAN ODT) 4 MG disintegrating tablet Take 1 tablet (4 mg total) by mouth every 6 (six) hours as needed for nausea. 20 tablet 0   Prenatal Vit-Fe Fumarate-FA (PRENATAL MULTIVITAMIN) TABS tablet Take 1 tablet by mouth daily at 12 noon. 2 Gummies a day      Allergies  Allergen Reactions   Oxycodone-Acetaminophen Hives and Itching   Fluticasone Swelling    Mouth swelling    Social History   Socioeconomic History   Marital status: Widowed     Spouse name: Not on file   Number of children: Not on file   Years of education: Not on file   Highest education level: Not on file  Occupational History   Not on file  Tobacco Use   Smoking status: Former    Types: Cigarettes    Quit date: 04/09/2013    Years since quitting: 7.8   Smokeless tobacco: Never  Vaping Use   Vaping Use: Never used  Substance and Sexual Activity   Alcohol use: Not Currently    Comment: 2x/month, none during pregnacy   Drug use: No   Sexual activity: Yes    Birth control/protection: Pill  Other Topics Concern   Not on file  Social History Narrative   Not on file   Social Determinants of Health   Financial Resource Strain: Not on file  Food Insecurity: Not on file  Transportation Needs: Not on file  Physical Activity: Not on file  Stress: Not on file  Social Connections: Not on file  Intimate Partner Violence: Not on file    Family History  Problem Relation Age of Onset   Diabetes Maternal Aunt    Hyperlipidemia Maternal Aunt      ROS   Physical Exam: BP 107/71 (BP Location: Left Arm)   Pulse 86   Temp 98.4 F (36.9 C) (Oral)   Resp 16   LMP 06/14/2020   Physical Exam Constitutional:      Appearance: Normal appearance. She is obese.  Genitourinary:     Genitourinary Comments: Gravid uterus. No external lesions or irritation. SVE: cervix is long , closed and the fetus is ballotable. See NST notes.  HENT:     Head: Normocephalic and atraumatic.  Cardiovascular:     Rate and Rhythm: Normal rate and regular rhythm.     Pulses: Normal pulses.     Heart sounds: Normal heart sounds.  Pulmonary:     Effort: Pulmonary effort is normal.     Breath sounds: Normal breath sounds.  Abdominal:     Comments: Gravid uterus Regular contractions oted per toco on the EFM.   Musculoskeletal:        General: Normal range of motion.     Cervical back: Normal range of motion and neck supple.  Neurological:     General: No focal deficit  present.     Mental Status: She is alert and oriented to person, place, and time.  Skin:    General: Skin is warm and dry.  Psychiatric:        Mood and Affect: Mood normal.        Behavior: Behavior normal.    Consults: None  Significant Findings/ Diagnostic Studies: labs:  Results for orders placed or performed during the hospital encounter of 03/01/21 (from the past 24 hour(s))  Urinalysis, Complete w Microscopic Urine, Clean  Catch     Status: Abnormal   Collection Time: 03/01/21  7:33 PM  Result Value Ref Range   Color, Urine YELLOW (A) YELLOW   APPearance CLOUDY (A) CLEAR   Specific Gravity, Urine 1.017 1.005 - 1.030   pH 6.0 5.0 - 8.0   Glucose, UA NEGATIVE NEGATIVE mg/dL   Hgb urine dipstick MODERATE (A) NEGATIVE   Bilirubin Urine NEGATIVE NEGATIVE   Ketones, ur 80 (A) NEGATIVE mg/dL   Protein, ur 30 (A) NEGATIVE mg/dL   Nitrite NEGATIVE NEGATIVE   Leukocytes,Ua LARGE (A) NEGATIVE   RBC / HPF 6-10 0 - 5 RBC/hpf   WBC, UA >50 (H) 0 - 5 WBC/hpf   Bacteria, UA MANY (A) NONE SEEN   Squamous Epithelial / LPF 21-50 0 - 5   Mucus PRESENT    Hyaline Casts, UA PRESENT      Procedures: EFM NST Baseline FHR: 135 beats/min Variability: moderate Accelerations: present Decelerations: absent Tocometry: initially , regular contractions noted, q 2-4 minutes, mild to palpation; post IV fluids and Terbutaline, then rare.  Interpretation:  INDICATIONS: rule out uterine contractions RESULTS:  A NST procedure was performed with FHR monitoring and a normal baseline established, appropriate time of 20-40 minutes of evaluation, and accels >2 seen w 15x15 characteristics.  Results show a REACTIVE NST.    Hospital Course: The patient was admitted to Labor and Delivery Triage for observation. She was placed on the fetal minitor, and regular contractions were noted.She was then given an IV bolus and hydrated. A Urine sample was sent to there lab, and the results indicate a possible UTI.  She was treated with Terbutaline, and after which her contractions dissipated. With a reactive NST, a closed cervix per SVE, she is discharged home to start on some Macrobid, remain better hydrated, and follow up at [facility] at her next [facility] appointment.  Discharge Condition: good  Disposition: Discharge disposition: 01-Home or Self Care       Diet: diet per her Gi specialists  Discharge Activity: No sex for 3 weeks and No heavy lifting for 3 weeks  Discharge Instructions     Fetal Kick Count:  Lie on our left side for one hour after a meal, and count the number of times your baby kicks.  If it is less than 5 times, get up, move around and drink some juice.  Repeat the test 30 minutes later.  If it is still less than 5 kicks in an hour, notify your doctor.   Complete by: As directed    LABOR:  When conractions begin, you should start to time them from the beginning of one contraction to the beginning  of the next.  When contractions are 5 - 10 minutes apart or less and have been regular for at least an hour, you should call your health care provider.   Complete by: As directed    Notify physician for bleeding from the vagina   Complete by: As directed    Notify physician for blurring of vision or spots before the eyes   Complete by: As directed    Notify physician for chills or fever   Complete by: As directed    Notify physician for fainting spells, "black outs" or loss of consciousness   Complete by: As directed    Notify physician for increase in vaginal discharge   Complete by: As directed    Notify physician for leaking of fluid   Complete by: As directed    Notify  physician for pain or burning when urinating   Complete by: As directed    Notify physician for pelvic pressure (sudden increase)   Complete by: As directed    Notify physician for severe or continued nausea or vomiting   Complete by: As directed    Notify physician for sudden gushing of fluid from the vagina  (with or without continued leaking)   Complete by: As directed    Notify physician for sudden, constant, or occasional abdominal pain   Complete by: As directed    Notify physician if baby moving less than usual   Complete by: As directed       Allergies as of 03/01/2021       Reactions   Oxycodone-acetaminophen Hives, Itching   Fluticasone Swelling   Mouth swelling        Medication List     TAKE these medications    Accu-Chek FastClix Lancet Kit 1 each by Does not apply route in the morning, at noon, in the evening, and at bedtime.   Accu-Chek Guide test strip Generic drug: glucose blood Check blood glucose 4 times a day   Accu-Chek Guide w/Device Kit 1 each by Does not apply route 4 (four) times daily.   cholecalciferol 25 MCG (1000 UNIT) tablet Commonly known as: VITAMIN D3 Take 2,000 Units by mouth daily.   Doxylamine-Pyridoxine 10-10 MG Tbec Commonly known as: Diclegis Take 2 tablets by mouth at bedtime. If symptoms persist, add one tablet in the morning and one in the afternoon   FreeStyle Libre 14 Day Reader Kerrin Mo 1 each by Does not apply route every 14 (fourteen) days.   FreeStyle Libre 14 Day Sensor Misc 1 each by Does not apply route every 14 (fourteen) days.   meclizine 25 MG tablet Commonly known as: ANTIVERT Take 1 tablet (25 mg total) by mouth 2 (two) times daily as needed for dizziness.   metFORMIN 500 MG tablet Commonly known as: GLUCOPHAGE Take 1 tablet (500 mg total) by mouth 2 (two) times daily with a meal.   ondansetron 4 MG disintegrating tablet Commonly known as: Zofran ODT Take 1 tablet (4 mg total) by mouth every 6 (six) hours as needed for nausea.   prenatal multivitamin Tabs tablet Take 1 tablet by mouth daily at 12 noon. 2 Gummies a day         Total time spent taking care of this patient: 50 minutes  Signed: Imagene Riches, CNM  03/01/2021, 8:10 PM

## 2021-03-01 NOTE — Telephone Encounter (Signed)
Pt calling; since 7am has been having pretty strong cramping; now when it happens it is accompanied y a big burning sensation; walking makes it worse; aying down doesn't seem to change it; ctxs every 3-5 minutes; no leaking of fluid; good fetal moviemen; lots of pressure.  Adv pt to go to L&D via ED; Shanda Bumps notified; pt is in [place] traffic trying to get to [facility]; adv if is not comfortable driving to call an ambulance.  Pt voices understanding.

## 2021-03-01 NOTE — OB Triage Note (Signed)
Pt reports some burning and cramping today. She is G4P3 3 c sections this c section scheduled for 12/22.

## 2021-03-01 NOTE — Discharge Summary (Signed)
  Please see final Progress Note  Mirna Mires, CNM  03/01/2021 8:09 PM

## 2021-03-04 LAB — URINE CULTURE

## 2021-03-07 ENCOUNTER — Other Ambulatory Visit: Payer: Self-pay

## 2021-03-07 ENCOUNTER — Encounter: Payer: Self-pay | Admitting: Obstetrics & Gynecology

## 2021-03-07 ENCOUNTER — Ambulatory Visit (INDEPENDENT_AMBULATORY_CARE_PROVIDER_SITE_OTHER): Payer: Medicaid Other | Admitting: Obstetrics & Gynecology

## 2021-03-07 VITALS — BP 120/80 | Wt 229.0 lb

## 2021-03-07 DIAGNOSIS — O0993 Supervision of high risk pregnancy, unspecified, third trimester: Secondary | ICD-10-CM

## 2021-03-07 DIAGNOSIS — O99213 Obesity complicating pregnancy, third trimester: Secondary | ICD-10-CM

## 2021-03-07 DIAGNOSIS — Z9884 Bariatric surgery status: Secondary | ICD-10-CM

## 2021-03-07 DIAGNOSIS — Q513 Bicornate uterus: Secondary | ICD-10-CM

## 2021-03-07 DIAGNOSIS — Z3A35 35 weeks gestation of pregnancy: Secondary | ICD-10-CM

## 2021-03-07 NOTE — Progress Notes (Addendum)
Subjective  Fetal Movement? yes Contractions? BHs.  Last week seen on L&D, resolved.  Also treated for UTI then (still on Macrobid) Leaking Fluid? no Vaginal Bleeding? no  Objective  BP 120/80   Wt 229 lb (103.9 kg)   LMP 06/14/2020   BMI 37.53 kg/m  General: NAD Pumonary: no increased work of breathing Abdomen: gravid, non-tender Extremities: no edema Psychiatric: mood appropriate, affect full  Assessment  30 y.o. [id number] at [redacted]w[redacted]d by  04/06/2021, by Ultrasound presenting for routine prenatal visit  Plan   Problem List Items Addressed This Visit       Genitourinary   Bicornuate uterus     Other   Maternal obesity, antepartum   Supervision of high risk pregnancy, antepartum - Primary   History of Roux-en-Y gastric bypass   Pregnancy  PNV, FMC, PTL precautions and VBAC precautions [place] Next week (MFM, growth) Planning CS 12/22  The following were addressed during this visit: Breastfeeding Education - Early initiation of breastfeeding  - The importance of exclusive breastfeeding  - Risks of giving your baby anything other than breast milk if you are breastfeeding  - Nonpharmacological pain relief methods for labor  - The importance of early skin-to-skin contact  - Rooming-in on a 24-hour basis  - Feeding on demand or baby-led feeding  - Frequent feeding to help assure optimal milk production  - Effective positioning and attachment  - Exclusive breastfeeding for the first 6 months  - Individualized Education  32-35 weeks - Birth Control Plans  - Birth Plan  - Feeding Plans  - Warning Signs - PIH  - Fetal Growth and Movement    pregnancy4  Problems (from 06/14/20 to present)     Problem Noted Resolved   Maternal obesity, antepartum 08/17/2020 by Nadara Mustard, MD No   Overview Addendum 02/07/2021  9:01 AM by Nadara Mustard, MD    BMI >=40 [x ] early fasting glc (no glucola due to recent gastric bypass)  [ ]  screen sleep apnea [ ]  anesthesia consult  (early and late if BMI > 45) [ ]  u/s for dating [ ]   [x ] nutritional goals [x ] folic acid 1mg  [ ]  bASA (>12 weeks) [x ] consider nutrition consult [ ]  consider maternal EKG 1st trimester [x]  Growth u/s 54 [x ], 76 [x ], 36 weeks [ ]  [x ] NST/AFI weekly 34+ weeks (34[] ,35[] ,36[] , 37[] , 38[] )      Supervision of high risk pregnancy, antepartum 08/17/2020 by , MD No   Overview Addendum 02/22/2021  8:40 AM by , MD     Nursing Staff Provider  Office Location  [facility] Dating   6 wk  Language  English Anatomy  Complete  Flu Vaccine  Declines Genetic Screen  NIPS:nml XX   TDaP vaccine   02/03/2021 Hgb A1C or  GTT Third trimester : 4.2  Covid unvaccinated   LAB RESULTS   Rhogam  n/a Blood Type A/Positive/-- (10/13 1013)   Feeding Plan bottle Antibody Negative (10/13 1013)  Contraception Pop then ocp-after 6 weeks  Rubella <0.90 (10/13 1013)  Circumcision n/a RPR Non Reactive (10/13 1013)   Pediatrician   HBsAg Negative (10/13 1013)   Support Person Husband HIV Non Reactive (10/13 1013)  Prenatal Classes no Varicella  immune    GBS  (For PCN allergy, check sensitivities)   BTL Consent no    VBAC Consent no Pap  2022       Plans R  CS 03/30/21      History recent roux-en-y (monthly MFM growth scans) Bicornuate uterus History placental abrupation        History of gastric bypass 08/17/2020 by Nadara Mustard, MD 12/20/2020 by Tresea Mall, CNM   Overview Signed 08/17/2020  1:53 PM by Nadara Mustard, MD    Vitamin use counseled Avoid glucola    Alternative screening for GDM discussed    Pt has never had GDM, thus is low risk           Annamarie Major, MD, [facility], [facility] 03/07/2021  8:13 AM

## 2021-03-07 NOTE — Patient Instructions (Addendum)
Thank you for choosing [facility]. As part of our ongoing efforts to improve patient experience, we would appreciate your feedback. Please fill out the short survey that you will receive by mail or MyChart. Your opinion is important to [place]! -Dr Tiburcio Pea  Cesarean Delivery Cesarean birth, or cesarean delivery, is the surgical delivery of a baby through an incision in the abdomen and the uterus. This may be referred to as a C-section. This procedure may be scheduled ahead of time, or it may be done in an emergency situation. Tell a health care provider about: Any allergies you have. All medicines you are taking, including vitamins, herbs, eye drops, creams, and over-the-counter medicines. Any problems you or family members have had with anesthetic medicines. Any bleeding problems you have. Any surgeries you have had. Any medical conditions you have. Whether you or any members of your family have a history of deep vein thrombosis (DVT) or pulmonary embolism (PE). What are the risks? Generally, this is a safe procedure. However, problems may occur, including: Infection. Bleeding. Allergic reactions to medicines. Damage to other structures or organs. Blood clots. Injury to your baby. What happens before the procedure? Medicines Ask your health care provider about: Changing or stopping your regular medicines. This is especially important if you are taking diabetes medicines or blood thinners. Taking medicines such as aspirin and ibuprofen. These medicines can thin your blood. Do not take these medicines unless your health care provider tells you to take them. Taking over-the-counter medicines, vitamins, herbs, and supplements. Tests Depending on the reason for your cesarean delivery, you may have a physical exam or additional testing, such as an ultrasound. You may have your blood or urine tested. General instructions Follow instructions from your health care provider about eating or  drinking restrictions. Do not shave your pubic area if you know that you are going to have a cesarean delivery. Shaving before the procedure may increase your risk of infection. Plan to have someone take you home from the hospital. Ask your health care provider what steps will be taken to prevent infection. These may include: Washing skin with a germ-killing soap. Taking antibiotic medicine. Questions for your health care provider Ask your health care provider about: Your pain management plan. This is especially important if you plan to breastfeed your baby. How long you will be in the hospital after the procedure. Any concerns you may have about receiving blood products, if you need them during the procedure. Cord blood banking, if you plan to collect your baby's umbilical cord blood. You may also want to ask your health care provider: Whether you will be able to hold or breastfeed your baby while you are still in the operating room. Whether your baby can stay with you immediately after the procedure and during your recovery. Whether a family member or a person of your choice can go with you into the operating room and stay with you during the procedure, immediately after the procedure, and during your recovery. What happens during the procedure?  An IV will be inserted into one of your veins. Fluid and medicines, such as antibiotics, will be given before the surgery. Fetal monitors will be placed on your abdomen to check your baby's heart rate. You may be given a warming gown to wear to keep your temperature stable. A catheter may be inserted into your bladder through your urethra. This drains your urine during the procedure. You may be given one or more of the following: A medicine to  numb the area (local anesthetic). A medicine to make you fall asleep (general anesthetic). A medicine (regional anesthetic) that is injected into your back or through a small thin tube placed in your back  (spinal anesthetic or epidural anesthetic). This numbs everything below the injection site and allows you to stay awake during your procedure. If this makes you feel nauseous, tell your health care provider. Medicines will be available to help reduce any nausea you may feel. An incision will be made in your abdomen, and then in your uterus. If you are awake during your procedure, you may feel tugging and pulling in your abdomen, but you should not feel pain. If you feel pain, tell your health care provider immediately. Your baby will be removed from your uterus. You may feel more pressure or pushing while this happens. Immediately after birth, your baby will be dried and kept warm. You may be able to hold and breastfeed your baby. The umbilical cord may be clamped and cut during this time. This usually occurs after waiting a period of 1-2 minutes after delivery. Your placenta will be removed from your uterus. Your incisions will be closed with stitches (sutures). Staples, skin glue, or adhesive strips may also be applied to the incision in your abdomen. Bandages (dressings) may be placed over the incision in your abdomen. The procedure may vary among health care providers and hospitals. What happens after the procedure? Your blood pressure, heart rate, breathing rate, and blood oxygen level will be monitored until you leave the hospital or clinic. You may continue to receive fluids and medicines through an IV. You will have some pain. Medicines will be available to help control your pain. To help prevent blood clots: You may be given medicines. You may have to wear compression stockings or devices. You will be encouraged to walk around when you are able. Hospital staff will encourage and support bonding with your baby. Your hospital may have you and your baby stay in the same room (rooming in) during your hospital stay to encourage successful bonding and breastfeeding. You may be encouraged to  cough and breathe deeply often. This helps to prevent lung problems. If you have a catheter draining your urine, it will be removed as soon as possible after your procedure. Summary Cesarean birth, or cesarean delivery, is the surgical delivery of a baby through an incision in the abdomen and the uterus. Follow instructions from your health care provider about eating or drinking restrictions before the procedure. You will have some pain after the procedure. Medicines will be available to help control your pain. Hospital staff will encourage and support bonding with your baby after the procedure. Your hospital may have you and your baby stay in the same room (rooming in) during your hospital stay to encourage successful bonding and breastfeeding. This information is not intended to replace advice given to you by your health care provider. Make sure you discuss any questions you have with your health care provider. Document Revised: 10/26/2020 Document Reviewed: 10/26/2020 Elsevier Patient Education  2022 [employer].

## 2021-03-14 ENCOUNTER — Other Ambulatory Visit: Payer: Self-pay

## 2021-03-14 ENCOUNTER — Ambulatory Visit (INDEPENDENT_AMBULATORY_CARE_PROVIDER_SITE_OTHER): Payer: Medicaid Other | Admitting: Obstetrics and Gynecology

## 2021-03-14 VITALS — BP 112/78 | Wt 228.0 lb

## 2021-03-14 DIAGNOSIS — O99210 Obesity complicating pregnancy, unspecified trimester: Secondary | ICD-10-CM

## 2021-03-14 DIAGNOSIS — O0990 Supervision of high risk pregnancy, unspecified, unspecified trimester: Secondary | ICD-10-CM

## 2021-03-14 DIAGNOSIS — Q513 Bicornate uterus: Secondary | ICD-10-CM

## 2021-03-14 DIAGNOSIS — Z98891 History of uterine scar from previous surgery: Secondary | ICD-10-CM

## 2021-03-14 DIAGNOSIS — Z9884 Bariatric surgery status: Secondary | ICD-10-CM

## 2021-03-14 DIAGNOSIS — Z3685 Encounter for antenatal screening for Streptococcus B: Secondary | ICD-10-CM

## 2021-03-14 DIAGNOSIS — Z3A36 36 weeks gestation of pregnancy: Secondary | ICD-10-CM

## 2021-03-14 LAB — POCT URINALYSIS DIPSTICK OB
Glucose, UA: NEGATIVE
POC,PROTEIN,UA: NEGATIVE

## 2021-03-14 NOTE — Progress Notes (Signed)
Routine Prenatal Care Visit  Subjective  Sandy [name] is a 30 y.o. [phone] at [redacted]w[redacted]d being seen today for ongoing prenatal care.  She is currently monitored for the following issues for this high-risk pregnancy and has HSV-2 (herpes simplex virus 2) infection; Bicornuate uterus; CVA (cerebral vascular accident) (HCC); Maternal obesity, antepartum; ASCUS with positive high risk HPV cervical; Supervision of high risk pregnancy, antepartum; History of 3 cesarean sections; History of Roux-en-Y gastric bypass; Postgastrectomy malabsorption; Dizziness; Pregnancy; and Abdominal pain in pregnancy, third trimester on their problem list.  ----------------------------------------------------------------------------------- Patient reports no complaints.   Contractions: Irregular. Vag. Bleeding: None.  Movement: Present. Denies leaking of fluid.  ----------------------------------------------------------------------------------- The following portions of the patient's history were reviewed and updated as appropriate: allergies, current medications, past family history, past medical history, past social history, past surgical history and problem list. Problem list updated.   Objective  Blood pressure 112/78, weight 228 lb (103.4 kg), last menstrual period 06/14/2020. Pregravid weight 288 lb (130.6 kg) Total Weight Gain -60 lb (-27.2 kg) Urinalysis:      Fetal Status: Fetal Heart Rate (bpm): 145 Fundal Height: 36 cm Movement: Present  Presentation: Transverse  General:  Alert, oriented and cooperative. Patient is in no acute distress.  Skin: Skin is warm and dry. No rash noted.   Cardiovascular: Normal heart rate noted  Respiratory: Normal respiratory effort, no problems with respiration noted  Abdomen: Soft, gravid, appropriate for gestational age. Pain/Pressure: Present     Pelvic:  Cervical exam deferred Dilation: Closed Effacement (%): 50 Station: -3  Extremities: Normal range of motion.  Edema:  None  ental Status: Normal mood and affect. Normal behavior. Normal judgment and thought content.     Assessment   30 y.o. G4P3003 at [redacted]w[redacted]d by  04/06/2021, by Ultrasound presenting for routine prenatal visit  Plan   pregnancy4  Problems (from 06/14/20 to present)     Problem Noted Resolved   Maternal obesity, antepartum 08/17/2020 by Nadara Mustard, MD No   Overview Addendum 02/07/2021  9:01 AM by Nadara Mustard, MD    BMI >=40 [x ] early fasting glc (no glucola due to recent gastric bypass)  [ ]  screen sleep apnea [ ]  anesthesia consult (early and late if BMI > 45) [ ]  u/s for dating [ ]   [x ] nutritional goals [x ] folic acid 1mg  [ ]  bASA (>12 weeks) [x ] consider nutrition consult [ ]  consider maternal EKG 1st trimester [x]  Growth u/s 15 [x ], 81 [x ], 36 weeks [ ]  [x ] NST/AFI weekly 34+ weeks (34[] ,35[] ,36[] , 37[] , 38[] )      Supervision of high risk pregnancy, antepartum 08/17/2020 by , MD No   Overview Addendum 02/22/2021  8:40 AM by , MD     Nursing Staff Provider  Office Location  [facility] Dating   6 wk  Language  English Anatomy  Complete  Flu Vaccine  Declines Genetic Screen  NIPS:nml XX   TDaP vaccine   02/03/2021 Hgb A1C or  GTT Third trimester : 4.2  Covid unvaccinated   LAB RESULTS   Rhogam  n/a Blood Type A/Positive/-- (10/13 1013)   Feeding Plan bottle Antibody Negative (10/13 1013)  Contraception Pop then ocp-after 6 weeks  Rubella <0.90 (10/13 1013)  Circumcision n/a RPR Non Reactive (10/13 1013)   Pediatrician   HBsAg Negative (10/13 1013)   Support Person Husband HIV Non Reactive (10/13 1013)  Prenatal Classes no Varicella  immune    GBS  (For PCN allergy, check sensitivities)   BTL Consent no    VBAC Consent no Pap  2022       Plans R CS 03/30/21      History recent roux-en-y (monthly MFM growth scans) Bicornuate uterus History placental abrupation        History of gastric bypass 08/17/2020  by Nadara Mustard, MD 12/20/2020 by Tresea Mall, CNM   Overview Signed 08/17/2020  1:53 PM by Nadara Mustard, MD    Vitamin use counseled Avoid glucola    Alternative screening for GDM discussed    Pt has never had GDM, thus is low risk           Gestational age appropriate obstetric precautions including but not limited to vaginal bleeding, contractions, leaking of fluid and fetal movement were reviewed in detail with the patient.    - GBS collected  Return in about 1 week (around 03/21/2021) for ROB.  Vena Austria, MD, [facility], [facility] 03/14/2021, 8:34 AM

## 2021-03-14 NOTE — Progress Notes (Signed)
ROB GBS 

## 2021-03-16 ENCOUNTER — Other Ambulatory Visit: Payer: Self-pay

## 2021-03-16 ENCOUNTER — Ambulatory Visit: Payer: Medicaid Other | Attending: Obstetrics and Gynecology

## 2021-03-16 DIAGNOSIS — O34593 Maternal care for other abnormalities of gravid uterus, third trimester: Secondary | ICD-10-CM | POA: Diagnosis not present

## 2021-03-16 DIAGNOSIS — Q513 Bicornate uterus: Secondary | ICD-10-CM | POA: Insufficient documentation

## 2021-03-16 DIAGNOSIS — Z3A37 37 weeks gestation of pregnancy: Secondary | ICD-10-CM | POA: Diagnosis not present

## 2021-03-16 DIAGNOSIS — E669 Obesity, unspecified: Secondary | ICD-10-CM

## 2021-03-16 DIAGNOSIS — O34219 Maternal care for unspecified type scar from previous cesarean delivery: Secondary | ICD-10-CM

## 2021-03-16 DIAGNOSIS — Z98891 History of uterine scar from previous surgery: Secondary | ICD-10-CM

## 2021-03-16 DIAGNOSIS — O0993 Supervision of high risk pregnancy, unspecified, third trimester: Secondary | ICD-10-CM | POA: Diagnosis not present

## 2021-03-16 DIAGNOSIS — O0990 Supervision of high risk pregnancy, unspecified, unspecified trimester: Secondary | ICD-10-CM

## 2021-03-16 DIAGNOSIS — O99213 Obesity complicating pregnancy, third trimester: Secondary | ICD-10-CM | POA: Diagnosis not present

## 2021-03-16 DIAGNOSIS — Z9884 Bariatric surgery status: Secondary | ICD-10-CM

## 2021-03-16 DIAGNOSIS — O9A213 Injury, poisoning and certain other consequences of external causes complicating pregnancy, third trimester: Secondary | ICD-10-CM | POA: Insufficient documentation

## 2021-03-16 DIAGNOSIS — O99843 Bariatric surgery status complicating pregnancy, third trimester: Secondary | ICD-10-CM | POA: Diagnosis not present

## 2021-03-16 DIAGNOSIS — T1490XD Injury, unspecified, subsequent encounter: Secondary | ICD-10-CM | POA: Diagnosis not present

## 2021-03-16 DIAGNOSIS — O99210 Obesity complicating pregnancy, unspecified trimester: Secondary | ICD-10-CM

## 2021-03-16 LAB — STREP GP B NAA: Strep Gp B NAA: NEGATIVE

## 2021-03-17 ENCOUNTER — Encounter: Payer: Self-pay | Admitting: Obstetrics and Gynecology

## 2021-03-17 ENCOUNTER — Encounter: Payer: Self-pay | Admitting: Obstetrics & Gynecology

## 2021-03-17 NOTE — Telephone Encounter (Signed)
Can you go over u/s results with pt?

## 2021-03-18 ENCOUNTER — Other Ambulatory Visit: Payer: Self-pay

## 2021-03-18 ENCOUNTER — Encounter: Payer: Self-pay | Admitting: Obstetrics and Gynecology

## 2021-03-18 ENCOUNTER — Observation Stay
Admission: EM | Admit: 2021-03-18 | Discharge: 2021-03-18 | Disposition: A | Discharge status: Home / Self Care | Payer: Medicaid Other | Attending: Obstetrics and Gynecology | Admitting: Obstetrics and Gynecology

## 2021-03-18 ENCOUNTER — Telehealth: Payer: Self-pay

## 2021-03-18 DIAGNOSIS — O4193X0 Disorder of amniotic fluid and membranes, unspecified, third trimester, not applicable or unspecified: Secondary | ICD-10-CM | POA: Diagnosis not present

## 2021-03-18 DIAGNOSIS — O0990 Supervision of high risk pregnancy, unspecified, unspecified trimester: Secondary | ICD-10-CM

## 2021-03-18 DIAGNOSIS — O99210 Obesity complicating pregnancy, unspecified trimester: Secondary | ICD-10-CM

## 2021-03-18 DIAGNOSIS — Z3A37 37 weeks gestation of pregnancy: Secondary | ICD-10-CM | POA: Diagnosis not present

## 2021-03-18 LAB — RUPTURE OF MEMBRANE (ROM)PLUS: Rom Plus: NEGATIVE

## 2021-03-18 NOTE — OB Triage Note (Signed)
Pt Sandy [name] 30 y.o. presents to labor and delivery triage reporting leaking of fluid . Pt is a G4P3003 at [redacted]w[redacted]d with hx of 3 previous c/s scheduled for c/s on 12/22. Pt reports waking up this morning and seeing 2 10 inch wet spots on the bed. Pt is unable to describe the consistency as it was on her blanket, but does state it does not smell like urine. Pt denies contractions and states positive fetal movement. External FM and TOCO applied to non-tender abdomen and assessing. Initial FHR 140 . Vital signs obtained and within normal limits. Provider notified of pt.

## 2021-03-18 NOTE — Progress Notes (Signed)
Pt discharged home per [place],MD order.  ROM plus was negative Pt stable and ambulatory and an After Visit Summary was printed and given to the patient. Discharge education completed with patient/family including follow up instructions, appointments, and medication list. Pt received labor and bleeding precautions. Patient able to verbalize understanding, all questions fully answered upon discharge. Patient instructed to return to ED, call 911, or call MD for any changes in condition. Pt discharged home via personal vehicle with support person.   

## 2021-03-18 NOTE — Discharge Summary (Signed)
Physician Final Progress Note  Patient ID: Sandy [name] MRN: [id number] DOB/AGE: 05-Nov-1990 30 y.o.  Admit date: 03/18/2021 Admitting provider: Vena Austria, MD Discharge date: 03/18/2021   Admission Diagnoses: Leaking fluid  Discharge Diagnoses:  Active Problems:   Labor and delivery indication for care or intervention  30 y.o. G4W1027 at [redacted]w[redacted]d presenting for leaking fluid.  ROM + negative.  +FM, no LOF.  Pregnancy notable for obesity Body mass index is 37.57 kg/m., history of 3 prior cesarean section.    Blood pressure 124/67, pulse 94, temperature 98.5 F (36.9 C), temperature source Oral, resp. rate 18, height 5' 5.5" (1.664 m), weight 104 kg, last menstrual period 06/14/2020.  pregnancy4  Problems (from 06/14/20 to present)     Problem Noted Resolved   Maternal obesity, antepartum 08/17/2020 by Nadara Mustard, MD No   Overview Addendum 02/07/2021  9:01 AM by Nadara Mustard, MD    BMI >=40 [x ] early fasting glc (no glucola due to recent gastric bypass)  [ ]  screen sleep apnea [ ]  anesthesia consult (early and late if BMI > 45) [ ]  u/s for dating [ ]   [x ] nutritional goals [x ] folic acid 1mg  [ ]  bASA (>12 weeks) [x ] consider nutrition consult [ ]  consider maternal EKG 1st trimester [x]  Growth u/s 53 [x ], 64 [x ], 36 weeks [ ]  [x ] NST/AFI weekly 34+ weeks (34[] ,35[] ,36[] , 37[] , 38[] )      Supervision of high risk pregnancy, antepartum 08/17/2020 by , MD No   Overview Addendum 02/22/2021  8:40 AM by , MD     Nursing Staff Provider  Office Location  [facility] Dating   6 wk  Language  English Anatomy  Complete  Flu Vaccine  Declines Genetic Screen  NIPS:nml XX   TDaP vaccine   02/03/2021 Hgb A1C or  GTT Third trimester : 4.2  Covid unvaccinated   LAB RESULTS   Rhogam  n/a Blood Type A/Positive/-- (10/13 1013)   Feeding Plan bottle Antibody Negative (10/13 1013)  Contraception Pop then ocp-after 6 weeks  Rubella  <0.90 (10/13 1013)  Circumcision n/a RPR Non Reactive (10/13 1013)   Pediatrician   HBsAg Negative (10/13 1013)   Support Person Husband HIV Non Reactive (10/13 1013)  Prenatal Classes no Varicella  immune    GBS  (For PCN allergy, check sensitivities)   BTL Consent no    VBAC Consent no Pap  2022       Plans R CS 03/30/21      History recent roux-en-y (monthly MFM growth scans) Bicornuate uterus History placental abrupation        History of gastric bypass 08/17/2020 by 01-03-1978, MD 12/20/2020 by 01-03-1978, CNM   Overview Signed 08/17/2020  1:53 PM by 01-03-1978, MD    Vitamin use counseled Avoid glucola    Alternative screening for GDM discussed    Pt has never had GDM, thus is low risk           Consults: None  Significant Findings/ Diagnostic Studies:  Results for orders placed or performed during the hospital encounter of 03/18/21 (from the past 24 hour(s))  ROM Plus ([facility] only)     Status: None   Collection Time: 03/18/21  9:23 AM  Result Value Ref Range   Rom Plus NEGATIVE    Procedures: NST 130, moderate, +accels, no decels Toco q5-64mild   Discharge Condition: good  Disposition: Discharge  disposition: 01-Home or Self Care       Diet: Regular diet  Discharge Activity: Activity as tolerated  Discharge Instructions     Discharge activity:  No Restrictions   Complete by: As directed    Discharge diet:  No restrictions   Complete by: As directed    Fetal Kick Count:  Lie on our left side for one hour after a meal, and count the number of times your baby kicks.  If it is less than 5 times, get up, move around and drink some juice.  Repeat the test 30 minutes later.  If it is still less than 5 kicks in an hour, notify your doctor.   Complete by: As directed    LABOR:  When conractions begin, you should start to time them from the beginning of one contraction to the beginning  of the next.  When contractions are 5 - 10 minutes apart or  less and have been regular for at least an hour, you should call your health care provider.   Complete by: As directed    No sexual activity restrictions   Complete by: As directed    Notify physician for bleeding from the vagina   Complete by: As directed    Notify physician for blurring of vision or spots before the eyes   Complete by: As directed    Notify physician for chills or fever   Complete by: As directed    Notify physician for fainting spells, "black outs" or loss of consciousness   Complete by: As directed    Notify physician for increase in vaginal discharge   Complete by: As directed    Notify physician for leaking of fluid   Complete by: As directed    Notify physician for pain or burning when urinating   Complete by: As directed    Notify physician for pelvic pressure (sudden increase)   Complete by: As directed    Notify physician for severe or continued nausea or vomiting   Complete by: As directed    Notify physician for sudden gushing of fluid from the vagina (with or without continued leaking)   Complete by: As directed    Notify physician for sudden, constant, or occasional abdominal pain   Complete by: As directed    Notify physician if baby moving less than usual   Complete by: As directed       Allergies as of 03/18/2021       Reactions   Oxycodone-acetaminophen Hives, Itching   Nsaids    Gastric Bypass    Fluticasone Swelling   Flonase  Mouth swelling        Medication List     STOP taking these medications    meclizine 25 MG tablet Commonly known as: ANTIVERT   nitrofurantoin (macrocrystal-monohydrate) 100 MG capsule Commonly known as: MACROBID   ondansetron 4 MG disintegrating tablet Commonly known as: Zofran ODT       TAKE these medications    cholecalciferol 25 MCG (1000 UNIT) tablet Commonly known as: VITAMIN D3 Take 2,000 Units by mouth daily.   prenatal multivitamin Tabs tablet Take 2 tablets by mouth daily at 12  noon. 2 Gummies a day         Total time spent taking care of this patient: triaged remotely  Signed: Vena Austria 03/18/2021, 10:16 AM

## 2021-03-21 ENCOUNTER — Encounter
Admission: RE | Admit: 2021-03-21 | Discharge: 2021-03-21 | Disposition: A | Discharge status: Home / Self Care | Payer: Medicaid Other | Source: Ambulatory Visit | Attending: Obstetrics and Gynecology | Admitting: Obstetrics and Gynecology

## 2021-03-21 ENCOUNTER — Ambulatory Visit: Payer: Medicaid Other

## 2021-03-21 ENCOUNTER — Other Ambulatory Visit: Payer: Self-pay

## 2021-03-21 NOTE — Patient Instructions (Signed)
Your procedure is scheduled on: 03/30/21 Arrival Time is 7:30 am at the [facility]. Valet Parking is available for your convenience. Wheelchairs are available as well if needed.  Remember: Instructions that are not followed completely may result in serious medical risk, up to and including death, or upon the discretion of your surgeon and anesthesiologist your surgery may need to be rescheduled.     _X__ 1. Do not eat food or drink any liquids  after midnight the night before your procedure.                 No gum chewing or hard candies.   __X__2.  On the morning of surgery brush your teeth with toothpaste and water, you                 may rinse your mouth with mouthwash if you wish.  Do not swallow any              toothpaste of mouthwash.     _X__ 3.  No Alcohol for 24 hours before or after surgery.   _X__ 4.  Do Not Smoke or use e-cigarettes For 24 Hours Prior to Your Surgery.                 Do not use any chewable tobacco products for at least 6 hours prior to                 surgery.  ____  5.  Bring all medications with you on the day of surgery if instructed.   __X__  6.  Notify your doctor if there is any change in your medical condition      (cold, fever, infections).     Do not wear jewelry, make-up, hairpins, clips or nail polish. Do not wear lotions, powders, or perfumes.  Do not shave body hair 48 hours prior to surgery. Men may shave face and neck. Do not bring valuables to the hospital.    [facility] is not responsible for any belongings or valuables.  Contacts, dentures/partials or body piercings may not be worn into surgery. Bring a case for your contacts, glasses or hearing aids, a denture cup will be supplied. Leave your suitcase in the car. After surgery it may be brought to your room. For patients admitted to the hospital, discharge time is determined by your treatment team.   Patients discharged the day of surgery will not be allowed to drive  home.   Please read over the following fact sheets that you were given:   CHG soap  __X__ Take these medicines the morning of surgery with A SIP OF WATER:    1. none  2.   3.   4.  5.  6.  ____ Fleet Enema (as directed)   __X__ Use CHG Soap/SAGE wipes as directed  ____ Use inhalers on the day of surgery  ____ Stop metformin/Janumet/Farxiga 2 days prior to surgery    ____ Take 1/2 of usual insulin dose the night before surgery. No insulin the morning          of surgery.   ____ Stop Blood Thinners Coumadin/Plavix/Xarelto/Pleta/Pradaxa/Eliquis/Effient/Aspirin  on   Or contact your Surgeon, Cardiologist or Medical Doctor regarding  ability to stop your blood thinners  __X__ Stop Anti-inflammatories 7 days before surgery such as Advil, Ibuprofen, Motrin,  BC or Goodies Powder, Naprosyn, Naproxen, Aleve, Aspirin    __X__ Stop all herbal supplements, fish oil or vitamin E until  after surgery.    ____ Bring C-Pap to the hospital.

## 2021-03-22 ENCOUNTER — Encounter: Payer: Medicaid Other | Admitting: Obstetrics and Gynecology

## 2021-03-22 NOTE — Progress Notes (Deleted)
Obstetric H&P   Chief Complaint: C-section scheduling  Prenatal Care Provider: [facility]  History of Present Illness: 30 y.o. [id number] 4w6dby 04/06/2021, by Ultrasound presenting for RArpinvisit and signing C-section consent.  The patient has a history of 3 prior cesarean section.  She reports +FM, irregular contractions, no LOF, no VB.  Pregnancy notable for recent roux-en-y, prior abruption, and obesity.  She has had a 58lbs weight loss this pregnancy with good fetal growth on ultrasounds.  Pregravid weight 288 lb (130.6 kg) Total Weight Gain -58 lb 11.6 oz (-26.6 kg)  pregnancy4  Problems (from 06/14/20 to present)     Problem Noted Resolved   Maternal obesity, antepartum 08/17/2020 by HGae Dry MD No   Overview Addendum 02/07/2021  9:01 AM by HGae Dry MD    BMI >=40 [x ] early fasting glc (no glucola due to recent gastric bypass)  '[ ]'  screen sleep apnea '[ ]'  anesthesia consult (early and late if BMI > 45) '[ ]'  u/s for dating '[ ]'   [x ] nutritional goals [x ] folic acid 147m'[ ]'  bASA (>12 weeks) [x ] consider nutrition consult '[ ]'  consider maternal EKG 1st trimester '[x]'  Growth u/s 2873x ], 3258x ], 36 weeks '[ ]'  [x ] NST/AFI weekly 34+ weeks (34'[]' ,35'[]' ,36'[]' , 37'[]' , 38'[]' )      Supervision of high risk pregnancy, antepartum 08/17/2020 by HaGae DryMD No   Overview Addendum 02/22/2021  8:40 AM by ScHomero FellersMD     Nursing Staff Provider  Office Location  [facility] Dating   6 wk [place]  English Anatomy [place]  Flu Vaccine  Declines Genetic Screen  NIPS:nml XX   TDaP vaccine   02/03/2021 Hgb A1C or  GTT Third trimester : 4.2  Covid unvaccinated   LAB RESULTS   Rhogam  n/a Blood Type A/Positive/-- (10/13 1013)   Feeding Plan bottle Antibody Negative (10/13 1013)  Contraception Pop then ocp-after 6 weeks  Rubella <0.90 (10/13 1013)  Circumcision n/a RPR Non Reactive (10/13 1013)   Pediatrician   HBsAg Negative (10/13 1013)   Support  Person Husband HIV Non Reactive (10/13 1013)  Prenatal Classes no Varicella  immune    GBS  (For PCN allergy, check sensitivities)   BTL Consent no    VBAC Consent no Pap  2022       Plans R CS 03/30/21      History recent roux-en-y (monthly MFM growth scans) Bicornuate uterus History placental abrupation        History of gastric bypass 08/17/2020 by HaGae DryMD 12/20/2020 by GlRod CanCNM   Overview Signed 08/17/2020  1:53 PM by HaGae DryMD    Vitamin use counseled Avoid glucola    Alternative screening for GDM discussed    Pt has never had GDM, thus is low risk           Review of Systems: 10 point review of systems negative unless otherwise noted in HPI  Past Medical History: Patient Active Problem List   Diagnosis Date Noted   Labor and delivery indication for care or intervention 03/18/2021   Abdominal pain in pregnancy, third trimester 03/01/2021   Pregnancy 01/31/2021   Dizziness 12/29/2020   Maternal obesity, antepartum 08/17/2020    BMI >=40 [x ] early fasting glc (no glucola due to recent gastric bypass)  '[ ]'  screen sleep apnea '[ ]'  anesthesia consult (early and late if  BMI > 45) '[ ]'  u/s for dating '[ ]'   [x ] nutritional goals [x ] folic acid 49m '[ ]'  bASA (>12 weeks) [x ] consider nutrition consult '[ ]'  consider maternal EKG 1st trimester '[x]'  Growth u/s 234[x ], 361[x ], 36 weeks '[ ]'  [x ] NST/AFI weekly 34+ weeks (34'[]' ,35'[]' ,36'[]' , 37'[]' , 38'[]' )    ASCUS with positive high risk HPV cervical 08/17/2020   Supervision of high risk pregnancy, antepartum 08/17/2020     Nursing Staff Provider  Office Location  [facility] Dating   6 wk [place] Language  English Anatomy [place] Complete  Flu Vaccine  Declines Genetic Screen  NIPS:nml XX   TDaP vaccine   02/03/2021 Hgb A1C or  GTT Third trimester : 4.2  Covid unvaccinated   LAB RESULTS   Rhogam  n/a Blood Type A/Positive/-- (10/13 1013)   Feeding Plan bottle Antibody Negative (10/13 1013)   Contraception Pop then ocp-after 6 weeks  Rubella <0.90 (10/13 1013)  Circumcision n/a RPR Non Reactive (10/13 1013)   Pediatrician   HBsAg Negative (10/13 1013)   Support Person Husband HIV Non Reactive (10/13 1013)  Prenatal Classes no Varicella  immune    GBS  (For PCN allergy, check sensitivities)   BTL Consent no    VBAC Consent no Pap  2022       Plans R CS 03/30/21      History recent roux-en-y (monthly MFM growth scans) Bicornuate uterus History placental abrupation      History of 3 cesarean sections 08/17/2020    Plan repeat cesarean section at 39 weeks    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   CVA (cerebral vascular accident) ([facility] 10/09/2019   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    Past Surgical History: Past Surgical History:  Procedure Laterality Date   CESAREAN SECTION  2012   for HSV outbreak   CESAREAN SECTION N/A 11/29/2015   Procedure: CESAREAN SECTION Baby Girl 7lb.,Isidoro Donning;  Surgeon: AMalachy Mood MD;  Location: [facility];  Service: Obstetrics;  Laterality: N/A;   CESAREAN SECTION N/A 12/18/2016   Procedure: CESAREAN SECTION;  Surgeon: JWill Bonnet MD;  Location: [facility];  Service: Obstetrics;  Laterality: N/A;   ears Bilateral    tubes   ESOPHAGOGASTRODUODENOSCOPY (EGD) WITH PROPOFOL N/A 05/04/2016   Procedure: ESOPHAGOGASTRODUODENOSCOPY (EGD) WITH PROPOFOL;  Surgeon: DLucilla Lame MD;  [facility]  Service: Endoscopy;  Laterality: N/A;   HERNIA REPAIR  06/18/2019   roux en y  05/2020   [place]AC ([place] Right    leg   TONSILLECTOMY      Past Obstetric History: # 1 - Date: 06/12/10, Sex: Female, Weight: None, GA: None, Delivery: C-Section, Low Transverse, Apgar1: None, Apgar5: None, Living: Living, Birth Comments: None  # 2 - Date: 11/29/15, Sex: Female, Weight: 7 lb 6.2 oz (3.35 kg), GA: 420w0dDelivery:  C-Section, Low Vertical, Apgar1: 8, Apgar5: 9, Living: Living, Birth Comments: None  # 3 - Date: 12/18/16, Sex: Female, Weight: 5 lb 11.7 oz (2.6 kg), GA: 3714w3delivery: C-Section, Low Transverse, Apgar1: 9, Apgar5: 9, Living: Living, Birth Comments: None  # 4 - Date: None, Sex: None, Weight: None, GA: None, Delivery: None, Apgar1: None, Apgar5: None, Living: None, Birth Comments: None   Past Gynecologic History:  Family History: Family History  Problem Relation Age of Onset   Diabetes Maternal Aunt  Hyperlipidemia Maternal Aunt     Social History: Social History   Socioeconomic History   Marital status: Widowed    Spouse name: Not on file   Number of children: Not on file   Years of education: Not on file   Highest education level: Not on file  Occupational History   Not on file  Tobacco Use   Smoking status: Former    Types: Cigarettes    Quit date: 04/09/2013    Years since quitting: 7.9   Smokeless tobacco: Never  Vaping Use   Vaping Use: Never used  Substance and Sexual Activity   Alcohol use: Not Currently    Comment: 2x/month, none during pregnacy   Drug use: No   Sexual activity: Yes    Birth control/protection: Pill  Other Topics Concern   Not on file  Social History Narrative   Not on file   Social Determinants of Health   Financial Resource Strain: Not on file  Food Insecurity: Not on file  Transportation Needs: Not on file  Physical Activity: Not on file  Stress: Not on file  Social Connections: Not on file  Intimate Partner Violence: Not on file    Medications: Prior to Admission medications   Medication Sig Start Date End Date Taking? Authorizing Provider  cholecalciferol (VITAMIN D3) 25 MCG (1000 UNIT) tablet Take 2,000 Units by mouth daily.    [provider]  Prenatal Vit-Fe Fumarate-FA (PRENATAL MULTIVITAMIN) TABS tablet Take 2 tablets by mouth daily at 12 noon. 2 Gummies a day    [provider]     Allergies: Allergies  Allergen Reactions   Oxycodone-Acetaminophen Hives and Itching   Nsaids     Gastric Bypass    Fluticasone Swelling    Flonase  Mouth swelling    Physical Exam: Vitals: ***  Urine Dip Protein: ***  FHT: ***  General: NAD HEENT: normocephalic, anicteric Pulmonary: No increased work of breathing Cardiovascular: RRR, distal pulses 2+ Abdomen: Gravid, non-tender Leopolds: vtx Genitourinary: gravid, non-tender Extremities: no edema, erythema, or tenderness Neurologic: Grossly intact Psychiatric: mood appropriate, affect full  Labs: No results found for this or any previous visit (from the past 24 hour(s)).  Assessment: 30 y.o. [id number] 8w6dby 04/06/2021, by Ultrasound presenting for ROB and C-section scheduling  Plan: 1) The patient was counseled regarding risk and benefits to proceeding with Cesarean section to expedite delivery.  Risk of cesarean section were discussed including risk of bleeding and need for potential intraoperative or postoperative blood transfusion with a rate of approximately 5% quoted for all Cesarean sections, risk of injury to adjacent organs including but not limited to bowl and bladder, the need for additional surgical procedures to address such injuries, and the risk of infection.   2) Fetus - good growth and AFI, have been followed secondary to recent roux-en-y -  3067  gm    6 lb 12 oz      54  % at 36 weeks 03/16/21  3) PNL - Blood type A/Positive/-- (10/13 1013) / Anti-bodyscreen Negative (10/13 1013) / Rubella <0.90 (10/13 1013) / Varicella Immune / RPR Non Reactive (10/13 1013) / HBsAg Negative (10/13 1013) / HIV Non Reactive (10/13 1013) / 1-hr OGTT N/A checked BG for 1 week / GBS Negative/-- (12/06 0830) - MMR postpartum  4) Immunization History -  Immunization History  Administered Date(s) Administered   Influenza, Seasonal, Injecte, Preservative Fre 02/01/2010   Tdap 06/13/2010, 02/03/2021    5) Disposition  - pending  delivery  Malachy Mood, MD, [facility], [facility] 03/22/2021, 5:52 AM

## 2021-03-23 ENCOUNTER — Encounter: Payer: Self-pay | Admitting: Obstetrics and Gynecology

## 2021-03-23 ENCOUNTER — Encounter: Payer: Self-pay | Admitting: Obstetrics & Gynecology

## 2021-03-24 ENCOUNTER — Encounter: Payer: Self-pay | Admitting: Obstetrics and Gynecology

## 2021-03-24 ENCOUNTER — Other Ambulatory Visit: Payer: Self-pay

## 2021-03-24 ENCOUNTER — Observation Stay
Admission: EM | Admit: 2021-03-24 | Discharge: 2021-03-25 | Disposition: A | Discharge status: Home / Self Care | Payer: Medicaid Other | Attending: Obstetrics and Gynecology | Admitting: Obstetrics and Gynecology

## 2021-03-24 DIAGNOSIS — R519 Headache, unspecified: Secondary | ICD-10-CM

## 2021-03-24 DIAGNOSIS — Z87891 Personal history of nicotine dependence: Secondary | ICD-10-CM | POA: Diagnosis not present

## 2021-03-24 DIAGNOSIS — Z3A38 38 weeks gestation of pregnancy: Secondary | ICD-10-CM | POA: Insufficient documentation

## 2021-03-24 DIAGNOSIS — O133 Gestational [pregnancy-induced] hypertension without significant proteinuria, third trimester: Secondary | ICD-10-CM | POA: Diagnosis not present

## 2021-03-24 DIAGNOSIS — R198 Other specified symptoms and signs involving the digestive system and abdomen: Secondary | ICD-10-CM | POA: Diagnosis present

## 2021-03-24 LAB — CBC WITH DIFFERENTIAL/PLATELET
Abs Immature Granulocytes: 0.03 10*3/uL (ref 0.00–0.07)
Basophils Absolute: 0 10*3/uL (ref 0.0–0.1)
Basophils Relative: 1 %
Eosinophils Absolute: 0 10*3/uL (ref 0.0–0.5)
Eosinophils Relative: 1 %
HCT: 32.1 % — ABNORMAL LOW (ref 36.0–46.0)
Hemoglobin: 11.6 g/dL — ABNORMAL LOW (ref 12.0–15.0)
Immature Granulocytes: 1 %
Lymphocytes Relative: 20 %
Lymphs Abs: 1.2 10*3/uL (ref 0.7–4.0)
MCH: 31.1 pg (ref 26.0–34.0)
MCHC: 36.1 g/dL — ABNORMAL HIGH (ref 30.0–36.0)
MCV: 86.1 fL (ref 80.0–100.0)
Monocytes Absolute: 0.3 10*3/uL (ref 0.1–1.0)
Monocytes Relative: 5 %
Neutro Abs: 4.4 10*3/uL (ref 1.7–7.7)
Neutrophils Relative %: 72 %
Platelets: 149 10*3/uL — ABNORMAL LOW (ref 150–400)
RBC: 3.73 MIL/uL — ABNORMAL LOW (ref 3.87–5.11)
RDW: 13.6 % (ref 11.5–15.5)
WBC: 5.9 10*3/uL (ref 4.0–10.5)
nRBC: 0 % (ref 0.0–0.2)

## 2021-03-24 LAB — COMPREHENSIVE METABOLIC PANEL
ALT: 19 U/L (ref 0–44)
AST: 19 U/L (ref 15–41)
Albumin: 2.9 g/dL — ABNORMAL LOW (ref 3.5–5.0)
Alkaline Phosphatase: 133 U/L — ABNORMAL HIGH (ref 38–126)
Anion gap: 5 (ref 5–15)
BUN: 8 mg/dL (ref 6–20)
CO2: 22 mmol/L (ref 22–32)
Calcium: 8.4 mg/dL — ABNORMAL LOW (ref 8.9–10.3)
Chloride: 107 mmol/L (ref 98–111)
Creatinine, Ser: 0.47 mg/dL (ref 0.44–1.00)
GFR, Estimated: >60 mL/min (ref >60)
Glucose, Bld: 89 mg/dL (ref 70–99)
Potassium: 3.6 mmol/L (ref 3.5–5.1)
Sodium: 134 mmol/L — ABNORMAL LOW (ref 135–145)
Total Bilirubin: 0.9 mg/dL (ref 0.3–1.2)
Total Protein: 6 g/dL — ABNORMAL LOW (ref 6.5–8.1)

## 2021-03-24 LAB — PROTEIN / CREATININE RATIO, URINE
Creatinine, Urine: 146 mg/dL
Protein Creatinine Ratio: 0.19 mg/mg{Cre} — ABNORMAL HIGH (ref 0.00–0.15)
Total Protein, Urine: 28 mg/dL

## 2021-03-24 LAB — TYPE AND SCREEN
ABO/RH(D): A POS
Antibody Screen: NEGATIVE

## 2021-03-24 MED ORDER — METOCLOPRAMIDE HCL 5 MG/ML IJ SOLN
10.0000 mg | Freq: Once | INTRAMUSCULAR | Status: AC
  Administered 2021-03-24: 10 mg via INTRAVENOUS
  Filled 2021-03-24: qty 2

## 2021-03-24 MED ORDER — ACETAMINOPHEN 500 MG PO TABS
1000.0000 mg | ORAL_TABLET | Freq: Once | ORAL | Status: AC
  Administered 2021-03-24: 1000 mg via ORAL
  Filled 2021-03-24: qty 2

## 2021-03-24 MED ORDER — LACTATED RINGERS IV BOLUS
1000.0000 mL | Freq: Once | INTRAVENOUS | Status: AC
  Administered 2021-03-24: 1000 mL via INTRAVENOUS

## 2021-03-24 MED ORDER — DIPHENHYDRAMINE HCL 50 MG/ML IJ SOLN
25.0000 mg | Freq: Once | INTRAMUSCULAR | Status: AC
  Administered 2021-03-24: 25 mg via INTRAVENOUS
  Filled 2021-03-24: qty 1

## 2021-03-24 NOTE — Progress Notes (Signed)
Pt arrived to [facility] with complaints of high blood pressure.Pt is a previous c/s.  Pt complains of swelling and headaches as well. Pt denies LOF and vaginal bleeding. Pt states positive FM. Monitors applied and assessing. Initial FHT 135.

## 2021-03-24 NOTE — OB Triage Provider Note (Signed)
Obstetric H&P   Chief Complaint: headache, swelling, elevated BP's at home   Prenatal Care Provider: Consuella Lose  History of Present Illness: 30 y.o. [id number] [redacted]w[redacted]d by 04/06/2021, by Ultrasound presenting to L&D for a intense headache and elevated blood pressures at home since yesterday. Last night a headache started in the front of her head, she checked her BP it was 190/113, she decided to go to bed to sleep the headache off. In the morning and throughout the day the head ache persisted, it is worse in bright lights.  She has not taken anything for the headache as she is not able to tolerate many medications d/t her hx of gastric bypass.  Throughout the day she continued to have severe range blood pressures and then they would just "drop".  She tried to rest to see if this would improve the headache. Given that the headache has not improved, she noticed some swelling in her legs,and she had high readings with her home BP cuff she decided to come to triage. On arrival to the unit she reported a headache "6/20", it is in the front and not as terrible as the lights in the room are dim. Denies any RUQ pain.  She is aware of contractions, but has had contractions since 35 weeks, today the contractions do not feel strong to her. Endorses +FM, denies LOF/VB. Reports a hx of preeclampsia with her first pregnancy.   1,000mg  Tylenol PO was given around 2040 with no relief. An IV bolus then Benadryl 25mg  and Reglan 10mg  were given IV, pt now reports headache is "almost gone"  Pregravid weight 130.6 kg Total Weight Gain -26.6 kg  pregnancy4  Problems (from 06/14/20 to present)     Problem Noted Resolved   Maternal obesity, antepartum 08/17/2020 by 08/14/20, MD No   Overview Addendum 02/07/2021  9:01 AM by Nadara Mustard, MD    BMI >=40 [x ] early fasting glc (no glucola due to recent gastric bypass)  [ ]  screen sleep apnea [ ]  anesthesia consult (early and late if BMI > 45) [ ]  u/s for  dating [ ]   [x ] nutritional goals [x ] folic acid 1mg  [ ]  bASA (>12 weeks) [x ] consider nutrition consult [ ]  consider maternal EKG 1st trimester [x]  Growth u/s 73 [x ], 101 [x ], 36 weeks [ ]  [x ] NST/AFI weekly 34+ weeks (34[] ,35[] ,36[] , 37[] , 38[] )      Supervision of high risk pregnancy, antepartum 08/17/2020 by , MD No   Overview Addendum 02/22/2021  8:40 AM by , MD     Nursing Staff Provider  Office Location  [facility] Dating   6 wk  Language  English Anatomy  Complete  Flu Vaccine  Declines Genetic Screen  NIPS:nml XX   TDaP vaccine   02/03/2021 Hgb A1C or  GTT Third trimester : 4.2  Covid unvaccinated   LAB RESULTS   Rhogam  n/a Blood Type A/Positive/-- (10/13 1013)   Feeding Plan bottle Antibody Negative (10/13 1013)  Contraception Pop then ocp-after 6 weeks  Rubella <0.90 (10/13 1013)  Circumcision n/a RPR Non Reactive (10/13 1013)   Pediatrician   HBsAg Negative (10/13 1013)   Support Person Husband HIV Non Reactive (10/13 1013)  Prenatal Classes no Varicella  immune    GBS  (For PCN allergy, check sensitivities)   BTL Consent no    VBAC Consent no Pap  2022  Plans R CS 03/30/21      History recent roux-en-y (monthly MFM growth scans) Bicornuate uterus History placental abrupation        History of gastric bypass 08/17/2020 by Nadara Mustard, MD 12/20/2020 by Tresea Mall, CNM   Overview Signed 08/17/2020  1:53 PM by Nadara Mustard, MD    Vitamin use counseled Avoid glucola    Alternative screening for GDM discussed    Pt has never had GDM, thus is low risk           Review of Systems: 10 point review of systems negative unless otherwise noted in HPI  Past Medical History: Patient Active Problem List   Diagnosis Date Noted   Headache 03/24/2021   Abdominal complaints 03/24/2021   Labor and delivery indication for care or intervention 03/18/2021   Abdominal pain in pregnancy, third trimester  03/01/2021   Pregnancy 01/31/2021   Dizziness 12/29/2020   Maternal obesity, antepartum 08/17/2020    BMI >=40 [x ] early fasting glc (no glucola due to recent gastric bypass)  [ ]  screen sleep apnea [ ]  anesthesia consult (early and late if BMI > 45) [ ]  u/s for dating [ ]   [x ] nutritional goals [x ] folic acid 1mg  [ ]  bASA (>12 weeks) [x ] consider nutrition consult [ ]  consider maternal EKG 1st trimester [x]  Growth u/s 105 [x ], 43 [x ], 36 weeks [ ]  [x ] NST/AFI weekly 34+ weeks (34[] ,35[] ,36[] , 37[] , 38[] )    ASCUS with positive high risk HPV cervical 08/17/2020   Supervision of high risk pregnancy, antepartum 08/17/2020     Nursing Staff Provider  Office Location  [facility] Dating   6 wk  Language  English Anatomy  Complete  Flu Vaccine  Declines Genetic Screen  NIPS:nml XX   TDaP vaccine   02/03/2021 Hgb A1C or  GTT Third trimester : 4.2  Covid unvaccinated   LAB RESULTS   Rhogam  n/a Blood Type A/Positive/-- (10/13 1013)   Feeding Plan bottle Antibody Negative (10/13 1013)  Contraception Pop then ocp-after 6 weeks  Rubella <0.90 (10/13 1013)  Circumcision n/a RPR Non Reactive (10/13 1013)   Pediatrician   HBsAg Negative (10/13 1013)   Support Person Husband HIV Non Reactive (10/13 1013)  Prenatal Classes no Varicella  immune    GBS  (For PCN allergy, check sensitivities)   BTL Consent no    VBAC Consent no Pap  2022       Plans R CS 03/30/21      History recent roux-en-y (monthly MFM growth scans) Bicornuate uterus History placental abrupation      History of 3 cesarean sections 08/17/2020    Plan repeat cesarean section at 39 weeks    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   CVA (cerebral vascular accident) ([facility]) 10/09/2019   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    Past Surgical History: Past Surgical History:  Procedure Laterality Date   CESAREAN  SECTION  2012   for HSV outbreak   CESAREAN SECTION N/A 11/29/2015   Procedure: CESAREAN SECTION Baby Girl 7lb.04/01/21.;  Surgeon: 10/17/2020, MD;  Location: [facility];  Service: Obstetrics;  Laterality: N/A;   CESAREAN SECTION N/A 12/18/2016   Procedure: CESAREAN SECTION;  Surgeon: 07/23/2020, MD;  Location: [facility];  Service: Obstetrics;  Laterality: N/A;   ears Bilateral    tubes   ESOPHAGOGASTRODUODENOSCOPY (  EGD) WITH PROPOFOL N/A 05/04/2016   Procedure: ESOPHAGOGASTRODUODENOSCOPY (EGD) WITH PROPOFOL;  Surgeon: Midge Minium, MD;  Location: [facility];  Service: Endoscopy;  Laterality: N/A;   HERNIA REPAIR  06/18/2019   roux en y  05/2020   STENT PLACEMENT ILIAC ([facility] HX) Right    leg   TONSILLECTOMY      Past Obstetric History: # 1 - Date: 06/12/10, Sex: Female, Weight: None, GA: None, Delivery: C-Section, Low Transverse, Apgar1: None, Apgar5: None, Living: Living, Birth Comments: None  # 2 - Date: 11/29/15, Sex: Female, Weight: 3350 g, GA: [redacted]w[redacted]d, Delivery: C-Section, Low Vertical, Apgar1: 8, Apgar5: 9, Living: Living, Birth Comments: None  # 3 - Date: 12/18/16, Sex: Female, Weight: 2600 g, GA: [redacted]w[redacted]d, Delivery: C-Section, Low Transverse, Apgar1: 9, Apgar5: 9, Living: Living, Birth Comments: None  # 4 - Date: None, Sex: None, Weight: None, GA: None, Delivery: None, Apgar1: None, Apgar5: None, Living: None, Birth Comments: None   Past Gynecologic History:  Family History: Family History  Problem Relation Age of Onset   Diabetes Maternal Aunt    Hyperlipidemia Maternal Aunt     Social History: Social History   Socioeconomic History   Marital status: Widowed    Spouse name: Not on file   Number of children: Not on file   Years of education: Not on file   Highest education level: Not on file  Occupational History   Not on file  Tobacco Use   Smoking status: Former    Types: Cigarettes    Quit date: 04/09/2013    Years since quitting: 7.9   Smokeless  tobacco: Never  Vaping Use   Vaping Use: Never used  Substance and Sexual Activity   Alcohol use: Not Currently    Comment: 2x/month, none during pregnacy   Drug use: No   Sexual activity: Yes    Birth control/protection: Pill  Other Topics Concern   Not on file  Social History Narrative   Not on file   Social Determinants of Health   Financial Resource Strain: Not on file  Food Insecurity: Not on file  Transportation Needs: Not on file  Physical Activity: Not on file  Stress: Not on file  Social Connections: Not on file  Intimate Partner Violence: Not on file    Medications: Prior to Admission medications   Medication Sig Start Date End Date Taking? Authorizing Provider  cholecalciferol (VITAMIN D3) 25 MCG (1000 UNIT) tablet Take 2,000 Units by mouth daily.   Yes [provider]  Prenatal Vit-Fe Fumarate-FA (PRENATAL MULTIVITAMIN) TABS tablet Take 2 tablets by mouth daily at 12 noon. 2 Gummies a day   Yes [provider]    Allergies: Allergies  Allergen Reactions   Oxycodone-Acetaminophen Hives and Itching   Nsaids     Gastric Bypass    Fluticasone Swelling    Flonase  Mouth swelling    Physical Exam: Vitals: Blood pressure 125/66, pulse 82, temperature 98.2 F (36.8 C), temperature source Oral, resp. rate 16, last menstrual period 06/14/2020.  Temp:  [98.2 F (36.8 C)] 98.2 F (36.8 C) (12/16 1958) Pulse Rate:  [77-92] 82 (12/16 2215) Resp:  [16] 16 (12/16 1958) BP: (98-133)/(45-71) 125/66 (12/16 2215)    FHT: baseline 130, moderate variability, pos accel, with prolonged accels, do decel  Toco: q2-6. Mild, soft resting tone   General: NAD HEENT: normocephalic, anicteric Pulmonary: No increased work of breathing Cardiovascular: RRR, distal pulses 2+ Abdomen: Gravid, non-tender Leopolds: vetex SVE FT/50/ballotable Extremities: BLE 1+,  no erythema, or tenderness Neurologic: Grossly intact Psychiatric: mood appropriate, affect  full  Labs: Results for orders placed or performed during the hospital encounter of 03/24/21 (from the past 24 hour(s))  Protein / creatinine ratio, urine     Status: Abnormal   Collection Time: 03/24/21  8:42 PM  Result Value Ref Range   Creatinine, Urine 146 mg/dL   Total Protein, Urine 28 mg/dL   Protein Creatinine Ratio 0.19 (H) 0.00 - 0.15 mg/mg[Cre]  Comprehensive metabolic panel     Status: Abnormal   Collection Time: 03/24/21  8:53 PM  Result Value Ref Range   Sodium 134 (L) 135 - 145 mmol/L   Potassium 3.6 3.5 - 5.1 mmol/L   Chloride 107 98 - 111 mmol/L   CO2 22 22 - 32 mmol/L   Glucose, Bld 89 70 - 99 mg/dL   BUN 8 6 - 20 mg/dL   Creatinine, Ser 9.52 0.44 - 1.00 mg/dL   Calcium 8.4 (L) 8.9 - 10.3 mg/dL   Total Protein 6.0 (L) 6.5 - 8.1 g/dL   Albumin 2.9 (L) 3.5 - 5.0 g/dL   AST 19 15 - 41 U/L   ALT 19 0 - 44 U/L   Alkaline Phosphatase 133 (H) 38 - 126 U/L   Total Bilirubin 0.9 0.3 - 1.2 mg/dL   GFR, Estimated >84 >13 mL/min   Anion gap 5 5 - 15  Type and screen     Status: None   Collection Time: 03/24/21  8:53 PM  Result Value Ref Range   ABO/RH(D) A POS    Antibody Screen NEG    Sample Expiration      03/27/2021,2359 Performed at [facility], [address] [place], [place] [address]   CBC with Differential/Platelet     Status: Abnormal   Collection Time: 03/24/21  8:53 PM  Result Value Ref Range   WBC 5.9 4.0 - 10.5 K/uL   RBC 3.73 (L) 3.87 - 5.11 MIL/uL   Hemoglobin 11.6 (L) 12.0 - 15.0 g/dL   HCT 02.7 (L) 25.3 - 66.4 %   MCV 86.1 80.0 - 100.0 fL   MCH 31.1 26.0 - 34.0 pg   MCHC 36.1 (H) 30.0 - 36.0 g/dL   RDW 40.3 47.4 - 25.9 %   Platelets 149 (L) 150 - 400 K/uL   nRBC 0.0 0.0 - 0.2 %   Neutrophils Relative % 72 %   Neutro Abs 4.4 1.7 - 7.7 K/uL   Lymphocytes Relative 20 %   Lymphs Abs 1.2 0.7 - 4.0 K/uL   Monocytes Relative 5 %   Monocytes Absolute 0.3 0.1 - 1.0 K/uL   Eosinophils Relative 1 %   Eosinophils Absolute 0.0 0.0 - 0.5  K/uL   Basophils Relative 1 %   Basophils Absolute 0.0 0.0 - 0.1 K/uL   Immature Granulocytes 1 %   Abs Immature Granulocytes 0.03 0.00 - 0.07 K/uL    Assessment: 30 y.o. [id number] [redacted]w[redacted]d by 04/06/2021, by Ultrasound in observation for headache and elevated blood pressures at home.   Plan: 1) Tylenol 1,000mg , followed by Benadryl 25mg  IV and Reglan 10mg  IV, 1 liter LR   2) Fetus -reactive NST   3) PNL - Blood type --/--/A POS (12/16 2053) / Anti-bodyscreen NEG (12/16 2053) / Rubella <0.90 (10/13 1013) / Varicella immune / RPR Non Reactive (10/13 1013) / HBsAg Negative (10/13 1013) / HIV Non Reactive (10/13 1013) / 1-hr OGTT unable to do d/r hx gastric bypass  / GBS  Negative/-- (12/06 0830)  4) Immunization History -  Immunization History  Administered Date(s) Administered   Influenza, Seasonal, Injecte, Preservative Fre 02/01/2010   Tdap 06/13/2010, 02/03/2021    5) Disposition - discharge home, keep scheduled c/s on 12/22   Carie Caddy CNM  [facility], [facility] 03/24/2021, 11:56 PM

## 2021-03-24 NOTE — Discharge Summary (Signed)
See OB triage provider note Carie Caddy, CNM  [facility], [facility]  @TODAY @  12:01 AM

## 2021-03-25 ENCOUNTER — Observation Stay (HOSPITAL_BASED_OUTPATIENT_CLINIC_OR_DEPARTMENT_OTHER)
Admission: EM | Admit: 2021-03-25 | Discharge: 2021-03-25 | Disposition: A | Discharge status: Home / Self Care | Payer: Medicaid Other | Source: Home / Self Care | Admitting: Obstetrics & Gynecology

## 2021-03-25 ENCOUNTER — Other Ambulatory Visit: Payer: Self-pay

## 2021-03-25 ENCOUNTER — Encounter: Payer: Self-pay | Admitting: Obstetrics & Gynecology

## 2021-03-25 DIAGNOSIS — R1030 Lower abdominal pain, unspecified: Secondary | ICD-10-CM | POA: Diagnosis not present

## 2021-03-25 DIAGNOSIS — O99210 Obesity complicating pregnancy, unspecified trimester: Secondary | ICD-10-CM

## 2021-03-25 DIAGNOSIS — O0990 Supervision of high risk pregnancy, unspecified, unspecified trimester: Secondary | ICD-10-CM

## 2021-03-25 DIAGNOSIS — Z3A38 38 weeks gestation of pregnancy: Secondary | ICD-10-CM

## 2021-03-25 DIAGNOSIS — O26893 Other specified pregnancy related conditions, third trimester: Secondary | ICD-10-CM | POA: Diagnosis not present

## 2021-03-25 DIAGNOSIS — O133 Gestational [pregnancy-induced] hypertension without significant proteinuria, third trimester: Secondary | ICD-10-CM | POA: Diagnosis not present

## 2021-03-25 NOTE — OB Triage Note (Signed)
Pt discharged home in stable condition. RN provided discharge instructions to pt. RN reviewed labor precautions to pt, including information on when to come back. Pt verbalized understanding and all questions answered at this time.

## 2021-03-25 NOTE — Discharge Instructions (Signed)

## 2021-03-25 NOTE — OB Triage Note (Signed)
Pt co being uncomfortable. Unable to rest. Audible fetal movement noted. Denies vaginal bleeding. Elaina Hoops

## 2021-03-26 NOTE — Final Progress Note (Addendum)
Physician Final Progress Note  Patient ID: Sandy [name] MRN: [id number] DOB/AGE: 1990-12-19 30 y.o.  Admit date: 03/25/2021 Admitting provider: Nadara Mustard, MD Discharge date: 03/25/2021  Admission Diagnoses: Lower abdominal pain in pregnancy 38 weeks  Discharge Diagnoses:  Same  Consults: None  Significant Findings/ Diagnostic Studies: Patient presented for evaluation of labor.  Patient had cervical exam by RN and this was reported to me. I reviewed her vital signs and fetal tracing, both of which were reassuring.  Patient was discharge as she was not laboring.  Procedures: A NST procedure was performed with FHR monitoring and a normal baseline established, appropriate time of 20-40 minutes of evaluation, and accels >2 seen w 15x15 characteristics.  Results show a REACTIVE NST.   Discharge Condition: good  Disposition: Discharge disposition: 01-Home or Self Care       Diet: Regular diet  Discharge Activity: Activity as tolerated   Allergies as of 03/25/2021       Reactions   Oxycodone-acetaminophen Hives, Itching   Nsaids    Gastric Bypass    Fluticasone Swelling   Flonase  Mouth swelling        Medication List     ASK your doctor about these medications    cholecalciferol 25 MCG (1000 UNIT) tablet Commonly known as: VITAMIN D3 Take 2,000 Units by mouth daily.   prenatal multivitamin Tabs tablet Take 2 tablets by mouth daily at 12 noon. 2 Gummies a day         Total time spent taking care of this patient: TRIAGE  Signed: Letitia Libra 03/26/2021, 11:03 AM  Addendum: Date changed for date of actual discharge of patient (03/25/21) Annamarie Major, MD, [facility], [facility] 03/28/2021  10:29 AM

## 2021-03-26 NOTE — Discharge Summary (Signed)
  See FPN 

## 2021-03-28 ENCOUNTER — Other Ambulatory Visit: Payer: Medicaid Other

## 2021-03-28 ENCOUNTER — Encounter: Payer: Self-pay | Admitting: Urgent Care

## 2021-03-28 ENCOUNTER — Other Ambulatory Visit: Payer: Self-pay

## 2021-03-28 ENCOUNTER — Other Ambulatory Visit
Admission: RE | Admit: 2021-03-28 | Discharge: 2021-03-28 | Disposition: A | Discharge status: Home / Self Care | Payer: Medicaid Other | Source: Ambulatory Visit | Attending: Obstetrics and Gynecology | Admitting: Obstetrics and Gynecology

## 2021-03-28 DIAGNOSIS — Z01812 Encounter for preprocedural laboratory examination: Secondary | ICD-10-CM | POA: Insufficient documentation

## 2021-03-28 DIAGNOSIS — Z20822 Contact with and (suspected) exposure to covid-19: Secondary | ICD-10-CM

## 2021-03-28 LAB — CBC
HCT: 33.5 % — ABNORMAL LOW (ref 36.0–46.0)
Hemoglobin: 11.5 g/dL — ABNORMAL LOW (ref 12.0–15.0)
MCH: 30.1 pg (ref 26.0–34.0)
MCHC: 34.3 g/dL (ref 30.0–36.0)
MCV: 87.7 fL (ref 80.0–100.0)
Platelets: 157 10*3/uL (ref 150–400)
RBC: 3.82 MIL/uL — ABNORMAL LOW (ref 3.87–5.11)
RDW: 14.1 % (ref 11.5–15.5)
WBC: 4.3 10*3/uL (ref 4.0–10.5)
nRBC: 0 % (ref 0.0–0.2)

## 2021-03-28 LAB — TYPE AND SCREEN
ABO/RH(D): A POS
Antibody Screen: NEGATIVE
Extend sample reason: UNDETERMINED

## 2021-03-28 LAB — SARS CORONAVIRUS 2 (TAT 6-24 HRS): SARS Coronavirus 2: NEGATIVE

## 2021-03-29 ENCOUNTER — Encounter: Payer: Self-pay | Admitting: Obstetrics and Gynecology

## 2021-03-29 LAB — RPR: RPR Ser Ql: NONREACTIVE

## 2021-03-30 ENCOUNTER — Inpatient Hospital Stay
Admission: EM | Admit: 2021-03-30 | Discharge: 2021-04-01 | DRG: 787 | Disposition: A | Discharge status: Home / Self Care | Payer: Medicaid Other | Attending: Obstetrics and Gynecology | Admitting: Obstetrics and Gynecology

## 2021-03-30 ENCOUNTER — Inpatient Hospital Stay: Payer: Medicaid Other | Admitting: Certified Registered Nurse Anesthetist

## 2021-03-30 ENCOUNTER — Other Ambulatory Visit: Payer: Self-pay

## 2021-03-30 ENCOUNTER — Inpatient Hospital Stay
Admission: RE | Admit: 2021-03-30 | Payer: Medicaid Other | Source: Home / Self Care | Admitting: Obstetrics and Gynecology

## 2021-03-30 ENCOUNTER — Encounter
Admission: EM | Disposition: A | Discharge status: Home / Self Care | Payer: Self-pay | Source: Home / Self Care | Attending: Obstetrics and Gynecology

## 2021-03-30 ENCOUNTER — Encounter: Payer: Self-pay | Admitting: Obstetrics and Gynecology

## 2021-03-30 ENCOUNTER — Encounter: Admission: RE | Payer: Self-pay | Source: Home / Self Care

## 2021-03-30 DIAGNOSIS — E669 Obesity, unspecified: Secondary | ICD-10-CM

## 2021-03-30 DIAGNOSIS — D62 Acute posthemorrhagic anemia: Secondary | ICD-10-CM | POA: Diagnosis not present

## 2021-03-30 DIAGNOSIS — O9081 Anemia of the puerperium: Secondary | ICD-10-CM | POA: Diagnosis not present

## 2021-03-30 DIAGNOSIS — O9903 Anemia complicating the puerperium: Secondary | ICD-10-CM | POA: Diagnosis not present

## 2021-03-30 DIAGNOSIS — O3403 Maternal care for unspecified congenital malformation of uterus, third trimester: Secondary | ICD-10-CM | POA: Diagnosis present

## 2021-03-30 DIAGNOSIS — O34211 Maternal care for low transverse scar from previous cesarean delivery: Principal | ICD-10-CM | POA: Diagnosis present

## 2021-03-30 DIAGNOSIS — O99214 Obesity complicating childbirth: Secondary | ICD-10-CM | POA: Diagnosis not present

## 2021-03-30 DIAGNOSIS — O99893 Other specified diseases and conditions complicating puerperium: Secondary | ICD-10-CM

## 2021-03-30 DIAGNOSIS — Z87891 Personal history of nicotine dependence: Secondary | ICD-10-CM

## 2021-03-30 DIAGNOSIS — R300 Dysuria: Secondary | ICD-10-CM | POA: Diagnosis not present

## 2021-03-30 DIAGNOSIS — Z98891 History of uterine scar from previous surgery: Secondary | ICD-10-CM

## 2021-03-30 DIAGNOSIS — O99844 Bariatric surgery status complicating childbirth: Secondary | ICD-10-CM | POA: Diagnosis present

## 2021-03-30 DIAGNOSIS — Z3A39 39 weeks gestation of pregnancy: Secondary | ICD-10-CM

## 2021-03-30 DIAGNOSIS — Q513 Bicornate uterus: Secondary | ICD-10-CM | POA: Diagnosis not present

## 2021-03-30 DIAGNOSIS — O34219 Maternal care for unspecified type scar from previous cesarean delivery: Secondary | ICD-10-CM | POA: Diagnosis present

## 2021-03-30 SURGERY — Surgical Case
Anesthesia: Spinal

## 2021-03-30 SURGERY — Surgical Case
Anesthesia: Choice

## 2021-03-30 MED ORDER — TRAMADOL HCL 50 MG PO TABS
50.0000 mg | ORAL_TABLET | Freq: Four times a day (QID) | ORAL | Status: DC | PRN
  Administered 2021-03-30 – 2021-04-01 (×6): 50 mg via ORAL
  Filled 2021-03-30 (×6): qty 1

## 2021-03-30 MED ORDER — OXYTOCIN-SODIUM CHLORIDE 30-0.9 UT/500ML-% IV SOLN
INTRAVENOUS | Status: AC
  Filled 2021-03-30: qty 500

## 2021-03-30 MED ORDER — COCONUT OIL OIL
1.0000 "application " | TOPICAL_OIL | Status: DC | PRN
  Filled 2021-03-30: qty 120

## 2021-03-30 MED ORDER — LACTATED RINGERS IV SOLN: INTRAVENOUS | Status: DC | PRN

## 2021-03-30 MED ORDER — BUPIVACAINE HCL (PF) 0.5 % IJ SOLN: 20.0000 mL | INTRAMUSCULAR | Status: DC

## 2021-03-30 MED ORDER — NALOXONE HCL 0.4 MG/ML IJ SOLN: 0.4000 mg | INTRAMUSCULAR | Status: DC | PRN

## 2021-03-30 MED ORDER — FENTANYL CITRATE (PF) 100 MCG/2ML IJ SOLN
INTRAMUSCULAR | Status: AC
  Filled 2021-03-30: qty 2

## 2021-03-30 MED ORDER — DIPHENHYDRAMINE HCL 25 MG PO CAPS: 25.0000 mg | ORAL_CAPSULE | Freq: Four times a day (QID) | ORAL | Status: DC | PRN

## 2021-03-30 MED ORDER — FENTANYL CITRATE (PF) 100 MCG/2ML IJ SOLN: INTRAMUSCULAR | Status: DC | PRN

## 2021-03-30 MED ORDER — MORPHINE SULFATE (PF) 0.5 MG/ML IJ SOLN
INTRAMUSCULAR | Status: DC | PRN
  Administered 2021-03-30: .1 mg via INTRATHECAL

## 2021-03-30 MED ORDER — MEPERIDINE HCL 25 MG/ML IJ SOLN: 6.2500 mg | INTRAMUSCULAR | Status: DC | PRN

## 2021-03-30 MED ORDER — SOD CITRATE-CITRIC ACID 500-334 MG/5ML PO SOLN
ORAL | Status: AC
  Administered 2021-03-30: 07:00:00 30 mL
  Filled 2021-03-30: qty 15

## 2021-03-30 MED ORDER — LACTATED RINGERS IV SOLN: Freq: Once | INTRAVENOUS | Status: DC

## 2021-03-30 MED ORDER — SENNOSIDES-DOCUSATE SODIUM 8.6-50 MG PO TABS
2.0000 | ORAL_TABLET | ORAL | Status: DC
  Administered 2021-03-30 – 2021-04-01 (×3): 2 via ORAL
  Filled 2021-03-30 (×3): qty 2

## 2021-03-30 MED ORDER — SIMETHICONE 80 MG PO CHEW
80.0000 mg | CHEWABLE_TABLET | Freq: Three times a day (TID) | ORAL | Status: DC
  Administered 2021-03-30 – 2021-04-01 (×5): 80 mg via ORAL
  Filled 2021-03-30 (×5): qty 1

## 2021-03-30 MED ORDER — METHYLERGONOVINE MALEATE 0.2 MG/ML IJ SOLN
INTRAMUSCULAR | Status: AC
  Filled 2021-03-30: qty 1

## 2021-03-30 MED ORDER — CEFAZOLIN SODIUM-DEXTROSE 2-3 GM-%(50ML) IV SOLR
INTRAVENOUS | Status: DC | PRN
  Administered 2021-03-30: 2 g via INTRAVENOUS

## 2021-03-30 MED ORDER — BUPIVACAINE HCL (PF) 0.5 % IJ SOLN
INTRAMUSCULAR | Status: AC
  Filled 2021-03-30: qty 30

## 2021-03-30 MED ORDER — MENTHOL 3 MG MT LOZG
1.0000 | LOZENGE | OROMUCOSAL | Status: DC | PRN
  Filled 2021-03-30: qty 9

## 2021-03-30 MED ORDER — ACETAMINOPHEN 500 MG PO TABS
1000.0000 mg | ORAL_TABLET | Freq: Four times a day (QID) | ORAL | Status: AC
  Administered 2021-03-30 – 2021-03-31 (×4): 1000 mg via ORAL
  Filled 2021-03-30 (×4): qty 2

## 2021-03-30 MED ORDER — SIMETHICONE 80 MG PO CHEW
80.0000 mg | CHEWABLE_TABLET | ORAL | Status: DC | PRN
  Administered 2021-03-30 – 2021-03-31 (×3): 80 mg via ORAL
  Filled 2021-03-30 (×3): qty 1

## 2021-03-30 MED ORDER — BUPIVACAINE HCL (PF) 0.5 % IJ SOLN
INTRAMUSCULAR | Status: DC | PRN
  Administered 2021-03-30: 10 mL

## 2021-03-30 MED ORDER — ONDANSETRON HCL 4 MG/2ML IJ SOLN
INTRAMUSCULAR | Status: DC | PRN
  Administered 2021-03-30: 4 mg via INTRAVENOUS

## 2021-03-30 MED ORDER — SOD CITRATE-CITRIC ACID 500-334 MG/5ML PO SOLN: 30.0000 mL | ORAL | Status: DC

## 2021-03-30 MED ORDER — ONDANSETRON HCL 4 MG/2ML IJ SOLN: 4.0000 mg | Freq: Three times a day (TID) | INTRAMUSCULAR | Status: DC | PRN

## 2021-03-30 MED ORDER — BUPIVACAINE IN DEXTROSE 0.75-8.25 % IT SOLN
INTRATHECAL | Status: DC | PRN
  Administered 2021-03-30: 1.8 mL via INTRATHECAL

## 2021-03-30 MED ORDER — BUPIVACAINE 0.25 % ON-Q PUMP DUAL CATH 400 ML
400.0000 mL | INJECTION | Status: DC
  Filled 2021-03-30: qty 400

## 2021-03-30 MED ORDER — MORPHINE SULFATE (PF) 0.5 MG/ML IJ SOLN
INTRAMUSCULAR | Status: AC
  Filled 2021-03-30: qty 10

## 2021-03-30 MED ORDER — LIDOCAINE HCL (PF) 1 % IJ SOLN
INTRAMUSCULAR | Status: DC | PRN
  Administered 2021-03-30: 3 mL via INTRADERMAL

## 2021-03-30 MED ORDER — PHENYLEPHRINE HCL-NACL 20-0.9 MG/250ML-% IV SOLN
INTRAVENOUS | Status: AC
  Filled 2021-03-30: qty 250

## 2021-03-30 MED ORDER — LACTATED RINGERS IV SOLN: INTRAVENOUS | Status: DC

## 2021-03-30 MED ORDER — DIBUCAINE (PERIANAL) 1 % EX OINT: 1.0000 "application " | TOPICAL_OINTMENT | CUTANEOUS | Status: DC | PRN

## 2021-03-30 MED ORDER — PHENYLEPHRINE HCL-NACL 20-0.9 MG/250ML-% IV SOLN
INTRAVENOUS | Status: DC | PRN
Start: 2021-03-30 — End: 2021-03-30
  Administered 2021-03-30: 50 ug/min via INTRAVENOUS

## 2021-03-30 MED ORDER — WITCH HAZEL-GLYCERIN EX PADS: 1.0000 "application " | MEDICATED_PAD | CUTANEOUS | Status: DC | PRN

## 2021-03-30 MED ORDER — MISOPROSTOL 200 MCG PO TABS
ORAL_TABLET | ORAL | Status: AC
  Filled 2021-03-30: qty 5

## 2021-03-30 MED ORDER — DIPHENHYDRAMINE HCL 25 MG PO CAPS: 25.0000 mg | ORAL_CAPSULE | ORAL | Status: DC | PRN

## 2021-03-30 MED ORDER — NALBUPHINE HCL 10 MG/ML IJ SOLN
5.0000 mg | INTRAMUSCULAR | Status: DC | PRN
  Administered 2021-03-30 – 2021-03-31 (×4): 5 mg via INTRAVENOUS
  Filled 2021-03-30 (×4): qty 1

## 2021-03-30 MED ORDER — CEFAZOLIN SODIUM-DEXTROSE 2-4 GM/100ML-% IV SOLN: 2.0000 g | INTRAVENOUS | Status: DC

## 2021-03-30 MED ORDER — DIPHENHYDRAMINE HCL 50 MG/ML IJ SOLN
12.5000 mg | INTRAMUSCULAR | Status: DC | PRN
  Administered 2021-03-30: 10:00:00 12.5 mg via INTRAVENOUS
  Filled 2021-03-30: qty 1

## 2021-03-30 MED ORDER — PHENYLEPHRINE HCL (PRESSORS) 10 MG/ML IV SOLN
INTRAVENOUS | Status: DC | PRN
  Administered 2021-03-30 (×2): 80 ug via INTRAVENOUS

## 2021-03-30 MED ORDER — CARBOPROST TROMETHAMINE 250 MCG/ML IM SOLN
INTRAMUSCULAR | Status: AC
  Filled 2021-03-30: qty 1

## 2021-03-30 MED ORDER — NALOXONE HCL 4 MG/10ML IJ SOLN
1.0000 ug/kg/h | INTRAVENOUS | Status: DC | PRN
  Filled 2021-03-30: qty 5

## 2021-03-30 MED ORDER — FENTANYL CITRATE (PF) 100 MCG/2ML IJ SOLN
INTRAMUSCULAR | Status: DC | PRN
  Administered 2021-03-30: 15 ug via INTRATHECAL

## 2021-03-30 MED ORDER — OXYTOCIN-SODIUM CHLORIDE 30-0.9 UT/500ML-% IV SOLN
INTRAVENOUS | Status: DC | PRN
  Administered 2021-03-30: 500 mL via INTRAVENOUS
  Administered 2021-03-30: 250 mL/h via INTRAVENOUS

## 2021-03-30 MED ORDER — EPHEDRINE SULFATE-NACL 50-0.9 MG/10ML-% IV SOSY
PREFILLED_SYRINGE | INTRAVENOUS | Status: DC | PRN
  Administered 2021-03-30 (×2): 2.5 mg via INTRAVENOUS

## 2021-03-30 MED ORDER — OXYTOCIN-SODIUM CHLORIDE 30-0.9 UT/500ML-% IV SOLN
2.5000 [IU]/h | INTRAVENOUS | Status: AC
  Administered 2021-03-30 (×2): 2.5 [IU]/h via INTRAVENOUS
  Filled 2021-03-30: qty 500

## 2021-03-30 MED ORDER — PRENATAL MULTIVITAMIN CH
1.0000 | ORAL_TABLET | Freq: Every day | ORAL | Status: DC
  Administered 2021-03-30 – 2021-04-01 (×3): 1 via ORAL
  Filled 2021-03-30 (×3): qty 1

## 2021-03-30 MED ORDER — SODIUM CHLORIDE 0.9% FLUSH: 3.0000 mL | INTRAVENOUS | Status: DC | PRN

## 2021-03-30 MED ORDER — ONDANSETRON HCL 4 MG/2ML IJ SOLN
INTRAMUSCULAR | Status: AC
  Filled 2021-03-30: qty 2

## 2021-03-30 MED ORDER — GLYCOPYRROLATE 0.2 MG/ML IJ SOLN
INTRAMUSCULAR | Status: DC | PRN
  Administered 2021-03-30: .2 mg via INTRAVENOUS

## 2021-03-30 MED ORDER — CEFAZOLIN SODIUM-DEXTROSE 2-4 GM/100ML-% IV SOLN
INTRAVENOUS | Status: AC
  Filled 2021-03-30: qty 100

## 2021-03-30 MED ORDER — HYDROMORPHONE HCL 2 MG PO TABS
4.0000 mg | ORAL_TABLET | ORAL | Status: DC | PRN
  Administered 2021-03-31 – 2021-04-01 (×4): 4 mg via ORAL
  Filled 2021-03-30 (×3): qty 2
  Filled 2021-03-30: qty 1
  Filled 2021-03-30: qty 2

## 2021-03-30 SURGICAL SUPPLY — 39 items
BAG COUNTER SPONGE SURGICOUNT (BAG) ×2 IMPLANT
BAG SURGICOUNT SPONGE COUNTING (BAG) ×1
CATH KIT ON-Q SILVERSOAK 5 (CATHETERS) ×2 IMPLANT
CATH KIT ON-Q SILVERSOAK 5IN (CATHETERS) ×6 IMPLANT
CHLORAPREP W/TINT 26 (MISCELLANEOUS) ×6 IMPLANT
CLOSURE STERI STRIP 1/2 X4 (GAUZE/BANDAGES/DRESSINGS) ×1 IMPLANT
CLOSURE WOUND 1/2 X4 (GAUZE/BANDAGES/DRESSINGS) ×1
DERMABOND ADHESIVE PROPEN (GAUZE/BANDAGES/DRESSINGS) ×2
DERMABOND ADVANCED (GAUZE/BANDAGES/DRESSINGS) ×2
DERMABOND ADVANCED .7 DNX12 (GAUZE/BANDAGES/DRESSINGS) ×1 IMPLANT
DERMABOND ADVANCED .7 DNX6 (GAUZE/BANDAGES/DRESSINGS) IMPLANT
DRSG OPSITE POSTOP 4X10 (GAUZE/BANDAGES/DRESSINGS) ×3 IMPLANT
DRSG TELFA 3X8 NADH (GAUZE/BANDAGES/DRESSINGS) ×3 IMPLANT
ELECT CAUTERY BLADE 6.4 (BLADE) ×3 IMPLANT
ELECT REM PT RETURN 9FT ADLT (ELECTROSURGICAL) ×3
ELECTRODE REM PT RTRN 9FT ADLT (ELECTROSURGICAL) ×1 IMPLANT
GAUZE SPONGE 4X4 12PLY STRL (GAUZE/BANDAGES/DRESSINGS) ×3 IMPLANT
GLOVE SURG ENC MOIS LTX SZ7 (GLOVE) ×3 IMPLANT
GLOVE SURG UNDER LTX SZ7.5 (GLOVE) ×3 IMPLANT
GOWN STRL REUS W/ TWL LRG LVL3 (GOWN DISPOSABLE) ×3 IMPLANT
GOWN STRL REUS W/TWL LRG LVL3 (GOWN DISPOSABLE) ×6
MANIFOLD NEPTUNE II (INSTRUMENTS) ×3 IMPLANT
MAT PREVALON FULL STRYKER (MISCELLANEOUS) ×3 IMPLANT
NS IRRIG 1000ML POUR BTL (IV SOLUTION) ×3 IMPLANT
PACK C SECTION AR (MISCELLANEOUS) ×3 IMPLANT
PAD DRESSING TELFA 3X8 NADH (GAUZE/BANDAGES/DRESSINGS) ×1 IMPLANT
PAD OB MATERNITY 4.3X12.25 (PERSONAL CARE ITEMS) ×3 IMPLANT
PAD PREP 24X41 OB/GYN DISP (PERSONAL CARE ITEMS) ×3 IMPLANT
PENCIL SMOKE EVACUATOR (MISCELLANEOUS) ×3 IMPLANT
SCRUB EXIDINE 4% CHG 4OZ (MISCELLANEOUS) ×3 IMPLANT
STRIP CLOSURE SKIN 1/2X4 (GAUZE/BANDAGES/DRESSINGS) ×2 IMPLANT
SUT MNCRL AB 4-0 PS2 18 (SUTURE) ×3 IMPLANT
SUT PDS AB 1 TP1 96 (SUTURE) ×8 IMPLANT
SUT VIC AB 0 CTX 36 (SUTURE) ×4
SUT VIC AB 0 CTX36XBRD ANBCTRL (SUTURE) ×2 IMPLANT
SUT VIC AB 2-0 CT1 27 (SUTURE) ×2
SUT VIC AB 2-0 CT1 36 (SUTURE) ×3 IMPLANT
SUT VIC AB 2-0 CT1 TAPERPNT 27 (SUTURE) IMPLANT
WATER STERILE IRR 500ML POUR (IV SOLUTION) ×3 IMPLANT

## 2021-03-30 NOTE — Op Note (Signed)
Preoperative Diagnosis: 1) 30 y.o. 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 4) Bicornuate uterus 5) Prior lower abdominal wall mesh placement 6.5cm at site of prior pfannenstiel   Postoperative Diagnosis: 1) 30 y.o. 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 4) No clear evidence of bicornuate uterus 5) Prior lower abdominal wall mesh placement 6.5cm at site of prior pfannenstiel   Operation Performed: Repeat low transverse C-section via midline vertical skin incision  Indiciation: Repeat cesarean  Anesthesia: Spinal  Primary Surgeon: Vena Austria, MD   Assistant:Paul Tiburcio Pea, MD this surgery required a high level surgical assistant with none other readily available  Preoperative Antibiotics: 2g Ancef  Estimated Blood Loss: 700 mL  IV Fluids:  Drains or Tubes: Foley to gravity drainage, ON-Q catheter system  Implants: none  Specimens Removed: none  Complications: none  Intraoperative Findings:  Normal tubes ovaries and uterus.  Delivery resulted in the birth of a liveborn female, APGAR (1 MIN): 8   APGAR (5 MINS): 9.  There was moderate scarring of the rectus fascia inferiorly as the site of the prior pfannenstiel incisions.  No permanent sutures were noted in the fascia.  The mesh was noted to be adhered to the peritoneum anterior and just superior to the bladder at the level of the prior pfannenstiel.    Patient Condition:stable  Procedure in Detail:  Patient was taken to the operating room were she was administered regional anesthesia.  She was positioned in the supine position, prepped and draped in the  Usual sterile fashion.  Prior to proceeding with the case a time out was performed and the level of anesthetic was checked and noted to be adequate.   Utilizing the scalpel a midline vertical skin incision was made 2cm above the pubic symphysis to about 4cm below the umbilicus and carried down sharply to the the level of the rectus fascia.  The fascia was incised in the midline using the scalpel and then extended by tenting up the fascia using a hemostat to tent up the fascia before incising with the scalpel..  The rectus muscles were dissected of the fascia using blunt dissection and the scalpel.  Adhesive disease was encountered at the site of the pfannenstiel.  Moving from superior and laterally a plane was established and the adhesions were taken down using the Bovie on top of the operators finger.    The midline was identified, the peritoneum was entered bluntly and expanded using manual tractions.  At this point the previously placed mesh was noted on the peritoneum just above the bladder.  The mesh was not cut or disrupted.  The uterus was noted to be in a none rotated position.  Next the bladder blade was placed retracting the bladder caudally.  A bladder flap was not created.  A low transverse incision was scored on the lower uterine segment.  The hysterotomy was entered bluntly using the operators finger.  The hysterotomy incision was extended using manual traction.  The operators hand was placed within the hysterotomy position noting the fetus to be within the OA position.  The vertex was grasped, flexed, brought to the incision, and delivered a traumatically using fundal pressure applied by Dr. Tiburcio Pea.  The remainder of the body delivered with ease.  The infant was suctioned, cord was  clamped and cut before handing off to the awaiting neonatologist.  The placenta was delivered using manual extraction.  The uterus was exteriorized, wiped clean of clots and debris using two moist laps.  The uterus did not feel to have a septum or distinct horns.  The hysterotomy was closed using a two layer closure of 0 Vicryl, with the first being a running locked,  the second a vertical imbricating.  The uterus was returned to the abdomen.  The peritoneal gutters were wiped clean of clots and debris using two moist laps.  The hysterotomy incision was re-inspected noted to be hemostatic.  T The rectus muscles were inspected noted to be hemostatic.  The superior border of the rectus fascia was grasped with a Kocher clamp.  The ON-Q trocars were then placed at the right superior border of the incision and tunneled subfascially.  The introducers were removed and the catheters were threaded through the sleeves after which the sleeves were removed.  The fascia was closed using a looped #1 PDS in a running fashion taking 1cm by 1cm bites.  The subcutaneous tissue was irrigated using warm saline, hemostasis achieved using the bovie.  The subcutaneous dead space was less than 3cm and was not closed.  The skin was closed using using interrupted subdermal 2-0 Vicryl stitches to take tension off the skin before closing the skin completely using 4-0 Monocryl in a subcuticular fashion.Marland Kitchen  Sponge needle and instrument counts were corrects times two.  The patient tolerated the procedure well and was taken to the recovery room in stable condition.

## 2021-03-30 NOTE — Progress Notes (Signed)
Called pt to check on status of arrival to [facility] for scheduled cesarean section. Pt stated that she was told to arrive at 0730. Pt states she will arrive as soon as possible.

## 2021-03-30 NOTE — Transfer of Care (Signed)
Immediate Anesthesia Transfer of Care Note  Patient: Sandy [name]  Procedure(s) Performed: CESAREAN SECTION  Patient Location: Mother/Baby  Anesthesia Type:Spinal  Level of Consciousness: awake, alert  and oriented  Airway & Oxygen Therapy: Patient Spontanous Breathing  Post-op Assessment: Report given to RN and Post -op Vital signs reviewed and stable  Post vital signs: Reviewed and stable  Last Vitals:  Vitals Value Taken Time  BP 104/59   Temp 98   Pulse 76   Resp 8   SpO2 99     Last Pain:  Vitals:   03/30/21 0738  TempSrc:   PainSc: 0-No pain         Complications: No notable events documented.

## 2021-03-30 NOTE — H&P (Signed)
Obstetric H&P   Chief Complaint: RLTCS  Prenatal Care Provider: wsob  History of Present Illness: 30 y.o. [id number] [redacted]w[redacted]d by 04/06/2021, by Ultrasound presenting to L&D for repeat LTCS. +FM, no LOF, no VB, irregular contractions  History of 3 prior c-section, history of gastric bypass, history of ventral hernia mesh.   Pregravid weight 130.6 kg Total Weight Gain -27.2 kg  pregnancy4  Problems (from 06/14/20 to present)     Problem Noted Resolved   Maternal obesity, antepartum 08/17/2020 by Nadara Mustard, MD No   Overview Addendum 02/07/2021  9:01 AM by Nadara Mustard, MD    BMI >=40 [x ] early fasting glc (no glucola due to recent gastric bypass)  [ ]  screen sleep apnea [ ]  anesthesia consult (early and late if BMI > 45) [ ]  u/s for dating [ ]   [x ] nutritional goals [x ] folic acid 1mg  [ ]  bASA (>12 weeks) [x ] consider nutrition consult [ ]  consider maternal EKG 1st trimester [x]  Growth u/s 31 [x ], 24 [x ], 36 weeks [ ]  [x ] NST/AFI weekly 34+ weeks (34[] ,35[] ,36[] , 37[] , 38[] )      Supervision of high risk pregnancy, antepartum 08/17/2020 by , MD No   Overview Addendum 02/22/2021  8:40 AM by , MD     Nursing Staff Provider  Office Location  [facility] Dating   6 wk  Language  English Anatomy  Complete  Flu Vaccine  Declines Genetic Screen  NIPS:nml XX   TDaP vaccine   02/03/2021 Hgb A1C or  GTT Third trimester : 4.2  Covid unvaccinated   LAB RESULTS   Rhogam  n/a Blood Type A/Positive/-- (10/13 1013)   Feeding Plan bottle Antibody Negative (10/13 1013)  Contraception Pop then ocp-after 6 weeks  Rubella <0.90 (10/13 1013)  Circumcision n/a RPR Non Reactive (10/13 1013)   Pediatrician   HBsAg Negative (10/13 1013)   Support Person Husband HIV Non Reactive (10/13 1013)  Prenatal Classes no Varicella  immune    GBS  (For PCN allergy, check sensitivities)   BTL Consent no    VBAC Consent no Pap  2022       Plans R CS  03/30/21      History recent roux-en-y (monthly MFM growth scans) Bicornuate uterus History placental abrupation        History of gastric bypass 08/17/2020 by 01-03-1978, MD 12/20/2020 by 01-03-1978, CNM   Overview Signed 08/17/2020  1:53 PM by 01-03-1978, MD    Vitamin use counseled Avoid glucola    Alternative screening for GDM discussed    Pt has never had GDM, thus is low risk           Review of Systems: 10 point review of systems negative unless otherwise noted in HPI  Past Medical History: Patient Active Problem List   Diagnosis Date Noted   Pregnancy with history of cesarean section, antepartum 03/30/2021   Indication for care in labor or delivery 03/25/2021   Headache 03/24/2021   Abdominal complaints 03/24/2021   Labor and delivery indication for care or intervention 03/18/2021   Abdominal pain in pregnancy, third trimester 03/01/2021   Pregnancy 01/31/2021   Dizziness 12/29/2020   Maternal obesity, antepartum 08/17/2020    BMI >=40 [x ] early fasting glc (no glucola due to recent gastric bypass)  [ ]  screen sleep apnea [ ]  anesthesia consult (early and late if BMI > 45) [ ]   u/s for dating [ ]   [x ] nutritional goals [x ] folic acid 1mg  [ ]  bASA (>12 weeks) [x ] consider nutrition consult [ ]  consider maternal EKG 1st trimester [x]  Growth u/s 29 [x ], 100 [x ], 36 weeks [ ]  [x ] NST/AFI weekly 34+ weeks (34[] ,35[] ,36[] , 37[] , 38[] )    ASCUS with positive high risk HPV cervical 08/17/2020   Supervision of high risk pregnancy, antepartum 08/17/2020     Nursing Staff Provider  Office Location  [facility] Dating   6 wk  Language  English Anatomy 26  Complete  Flu Vaccine  Declines Genetic Screen  NIPS:nml XX   TDaP vaccine   02/03/2021 Hgb A1C or  GTT Third trimester : 4.2  Covid unvaccinated   LAB RESULTS   Rhogam  n/a Blood Type A/Positive/-- (10/13 1013)   Feeding Plan bottle Antibody Negative (10/13 1013)  Contraception Pop then  ocp-after 6 weeks  Rubella <0.90 (10/13 1013)  Circumcision n/a RPR Non Reactive (10/13 1013)   Pediatrician   HBsAg Negative (10/13 1013)   Support Person Husband HIV Non Reactive (10/13 1013)  Prenatal Classes no Varicella  immune    GBS  (For PCN allergy, check sensitivities)   BTL Consent no    VBAC Consent no Pap  2022       Plans R CS 03/30/21      History recent roux-en-y (monthly MFM growth scans) Bicornuate uterus History placental abrupation      History of 3 cesarean sections 08/17/2020    Plan repeat cesarean section at 39 weeks    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   CVA (cerebral vascular accident) (HCC) 10/09/2019   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    Past Surgical History: Past Surgical History:  Procedure Laterality Date   CESAREAN SECTION  2012   for HSV outbreak   CESAREAN SECTION N/A 11/29/2015   Procedure: CESAREAN SECTION Baby Girl 7lb.07/23/2020.;  Surgeon: 12/10/2019, MD;  Location: [facility] ORS;  Service: Obstetrics;  Laterality: N/A;   CESAREAN SECTION N/A 12/18/2016   Procedure: CESAREAN SECTION;  Surgeon: 08/11/2010, MD;  Location: [facility];  Service: Obstetrics;  Laterality: N/A;   ears Bilateral    tubes   ESOPHAGOGASTRODUODENOSCOPY (EGD) WITH PROPOFOL N/A 05/04/2016   Procedure: ESOPHAGOGASTRODUODENOSCOPY (EGD) WITH PROPOFOL;  Surgeon: 12/01/2015, MD;  Location: [facility];  Service: Endoscopy;  Laterality: N/A;   HERNIA REPAIR  06/18/2019   roux en y  05/2020   STENT PLACEMENT ILIAC ([facility] HX) Right    leg   TONSILLECTOMY      Past Obstetric History: # 1 - Date: 06/12/10, Sex: Female, Weight: None, GA: None, Delivery: C-Section, Low Transverse, Apgar1: None, Apgar5: None, Living: Living, Birth Comments: None  # 2 - Date: 11/29/15, Sex: Female, Weight: 3350 g, GA: [redacted]w[redacted]d, Delivery: C-Section, Low Vertical, Apgar1: 8,  Apgar5: 9, Living: Living, Birth Comments: None  # 3 - Date: 12/18/16, Sex: Female, Weight: 2600 g, GA: [redacted]w[redacted]d, Delivery: C-Section, Low Transverse, Apgar1: 9, Apgar5: 9, Living: Living, Birth Comments: None  # 4 - Date: None, Sex: None, Weight: None, GA: None, Delivery: None, Apgar1: None, Apgar5: None, Living: None, Birth Comments: None   Past Gynecologic History:  Family History: Family History  Problem Relation Age of Onset   Diabetes Maternal Aunt    Hyperlipidemia Maternal Aunt     Social History: Social History  Socioeconomic History   Marital status: Widowed    Spouse name: Not on file   Number of children: Not on file   Years of education: Not on file   Highest education level: Not on file  Occupational History   Not on file  Tobacco Use   Smoking status: Former    Types: Cigarettes    Quit date: 04/09/2013    Years since quitting: 7.9   Smokeless tobacco: Never  Vaping Use   Vaping Use: Never used  Substance and Sexual Activity   Alcohol use: Not Currently    Comment: 2x/month, none during pregnacy   Drug use: No   Sexual activity: Yes    Birth control/protection: Pill  Other Topics Concern   Not on file  Social History Narrative   Not on file   Social Determinants of Health   Financial Resource Strain: Not on file  Food Insecurity: Not on file  Transportation Needs: Not on file  Physical Activity: Not on file  Stress: Not on file  Social Connections: Not on file  Intimate Partner Violence: Not on file    Medications: Prior to Admission medications   Medication Sig Start Date End Date Taking? Authorizing Provider  cholecalciferol (VITAMIN D3) 25 MCG (1000 UNIT) tablet Take 2,000 Units by mouth daily.    [provider]  Prenatal Vit-Fe Fumarate-FA (PRENATAL MULTIVITAMIN) TABS tablet Take 2 tablets by mouth daily at 12 noon. 2 Gummies a day    [provider]    Allergies: Allergies  Allergen Reactions    Oxycodone-Acetaminophen Hives and Itching   Nsaids     Gastric Bypass    Fluticasone Swelling    Flonase  Mouth swelling    Physical Exam: Vitals: Blood pressure 125/75, pulse 81, temperature 98.6 F (37 C), temperature source Oral, resp. rate 14, height 5' 5.5" (1.664 m), weight 103.4 kg, last menstrual period 06/14/2020.   FHT 130, moderate, +accels, no decels  General: NAD HEENT: normocephalic, anicteric Pulmonary: No increased work of breathing Cardiovascular: RRR, distal pulses 2+ Abdomen: Gravid, non-tender Leopolds:vtx Genitourinary: deferred Extremities: no edema, erythema, or tenderness Neurologic: Grossly intact Psychiatric: mood appropriate, affect full  Labs: No results found for this or any previous visit (from the past 24 hour(s)).  Assessment: 30 y.o. [id number] [redacted]w[redacted]d by 04/06/2021, by Ultrasound presenting for RLTCS  Plan: 1) The patient was counseled regarding risk and benefits to proceeding with Cesarean section to expedite delivery.  Risk of cesarean section were discussed including risk of bleeding and need for potential intraoperative or postoperative blood transfusion with a rate of approximately 5% quoted for all Cesarean sections, risk of injury to adjacent organs including but not limited to bowl and bladder, the need for additional surgical procedures to address such injuries, and the risk of infection.    The patient has a prior hernia mesh placed at her C-section scar site.  Discussed entry may need to be vertical.   2) Fetus - cat I  3) PNL - Blood type --/--/A POS (12/20 0817) / Anti-bodyscreen NEG (12/20 0817) / Rubella <0.90 (10/13 1013) / Varicella Immune / RPR NON REACTIVE (12/20 0817) / HBsAg Negative (10/13 1013) / HIV Non Reactive (10/13 1013)  / GBS Negative/-- (12/06 0830)  4) Immunization History -  Immunization History  Administered Date(s) Administered   Influenza, Seasonal, Injecte, Preservative Fre 02/01/2010   Tdap 06/13/2010,  02/03/2021    5) Disposition - pending  Vena Austria, MD, [facility], [facility] 03/30/2021, 7:30 AM

## 2021-03-30 NOTE — Anesthesia Procedure Notes (Signed)
Spinal  Patient location during procedure: OR Start time: 03/30/2021 7:43 AM End time: 03/30/2021 7:48 AM Reason for block: surgical anesthesia Staffing Performed: resident/CRNA  Anesthesiologist: Molli Barrows, MD Resident/CRNA: Tollie Eth, CRNA Preanesthetic Checklist Completed: patient identified, IV checked, site marked, risks and benefits discussed, surgical consent, monitors and equipment checked, pre-op evaluation and timeout performed Spinal Block Patient position: sitting Prep: Betadine Patient monitoring: heart rate, continuous pulse ox, blood pressure and cardiac monitor Approach: midline Location: L4-5 Injection technique: single-shot Needle Needle type: Whitacre and Introducer  Needle gauge: 24 G Needle length: 9 cm Assessment Events: CSF return Additional Notes Negative paresthesia. Negative blood return. Positive free-flowing CSF. Expiration date of kit checked and confirmed. Patient tolerated procedure well, without complications.

## 2021-03-30 NOTE — Discharge Summary (Signed)
OB Discharge Summary     Patient Name: Sandy [name] DOB: 04/08/1991 MRN: [id number]  Date of admission: 03/30/2021 Delivering MD: Vena Austria   Date of discharge: 04/01/2021  Admitting diagnosis: Pregnancy with history of cesarean section, antepartum [O34.219] Intrauterine pregnancy: [redacted]w[redacted]d     Secondary diagnosis:  Principal Problem:   Pregnancy with history of cesarean section, antepartum  Additional problems: None     Discharge diagnosis: Term Pregnancy Delivered                                                                                                Post partum procedures: none  Augmentation: N/A  Complications: None  Hospital course:  Sceduled C/S   30 y.o. yo G4P4004 at [redacted]w[redacted]d was admitted to the hospital 03/30/2021 for scheduled cesarean section with the following indication:Elective Repeat.Delivery details are as follows:  Membrane Rupture Time/Date: 8:15 AM ,03/30/2021   Delivery Method:C-Section, Low Transverse  Details of operation can be found in separate operative note.  Patient had an uncomplicated postpartum course.  She is ambulating, tolerating a regular diet, passing flatus, and urinating well. Patient is discharged home in stable condition on  04/01/21        Newborn Data: Birth date:03/30/2021  Birth time:8:16 AM  Gender:Female  Living status:Living  Apgars:8 ,9  Weight:3400 g     Physical exam  Vitals:   03/31/21 1915 04/01/21 0300 04/01/21 0800 04/01/21 0814  BP: 125/86 117/73 128/65 125/87  Pulse: 78 73 77 83  Resp: 19 19 20 20   Temp: 97.6 F (36.4 C) 98.4 F (36.9 C) 98.5 F (36.9 C) 98 F (36.7 C)  TempSrc: Oral Oral Oral Oral  SpO2: 99% 97% 100% 98%  Weight:      Height:       General: alert, cooperative, and no distress Lochia: inappropriate Uterine Fundus: firm Incision: Healing well with no significant drainage DVT Evaluation: No evidence of DVT seen on physical exam. Labs: Lab Results  Component Value Date    WBC 8.8 03/31/2021   HGB 12.4 03/31/2021   HCT 35.5 (L) 03/31/2021   MCV 86.8 03/31/2021   PLT 145 (L) 03/31/2021   CMP Latest Ref Rng & Units 03/24/2021  Glucose 70 - 99 mg/dL 89  BUN 6 - 20 mg/dL 8  Creatinine 03/26/2021 - 1.31 mg/dL 4.38  Sodium 8.87 - 579 mmol/L 134(L)  Potassium 3.5 - 5.1 mmol/L 3.6  Chloride 98 - 111 mmol/L 107  CO2 22 - 32 mmol/L 22  Calcium 8.9 - 10.3 mg/dL 728)  Total Protein 6.5 - 8.1 g/dL 6.0(L)  Total Bilirubin 0.3 - 1.2 mg/dL 0.9  Alkaline Phos 38 - 126 U/L 133(H)  AST 15 - 41 U/L 19  ALT 0 - 44 U/L 19    Discharge instruction: per After Visit Summary and "Baby and Me Booklet".  After visit meds:  Allergies as of 04/01/2021       Reactions   Oxycodone-acetaminophen Hives, Itching   Nsaids    Gastric Bypass    Fluticasone Swelling   Flonase  Mouth swelling  Medication List     TAKE these medications    acetaminophen 500 MG tablet Commonly known as: TYLENOL Take 2 tablets (1,000 mg total) by mouth every 6 (six) hours as needed for fever or headache.   cholecalciferol 25 MCG (1000 UNIT) tablet Commonly known as: VITAMIN D3 Take 2,000 Units by mouth daily.   HYDROmorphone 4 MG tablet Commonly known as: DILAUDID Take 1 tablet (4 mg total) by mouth every 4 (four) hours as needed for severe pain ((when tolerating fluids)).   norethindrone 0.35 MG tablet Commonly known as: Camila Take 1 tablet (0.35 mg total) by mouth daily.   prenatal multivitamin Tabs tablet Take 2 tablets by mouth daily at 12 noon. 2 Gummies a day   traMADol 50 MG tablet Commonly known as: ULTRAM Take 1 tablet (50 mg total) by mouth every 6 (six) hours as needed for moderate pain or severe pain.        Diet: routine diet  Activity: Advance as tolerated. Pelvic rest for 6 weeks.   Outpatient follow up:1 week Follow up Appt: Future Appointments  Date Time Provider Department Center  04/11/2021 10:55 AM Vena Austria, MD [facility] None   Follow up  Visit:No follow-ups on file.  Postpartum contraception: Progesterone only pills  Newborn Data: Live born female  Birth Weight: 7 lb 7.9 oz (3400 g) APGAR: 8, 9  Newborn Delivery   Birth date/time: 03/30/2021 08:16:00 Delivery type: C-Section, Low Transverse Trial of labor: No C-section categorization: Repeat      Baby Feeding: Bottle Disposition:home with mother   04/01/2021 Natale Milch, MD

## 2021-03-31 LAB — URINALYSIS, COMPLETE (UACMP) WITH MICROSCOPIC
Bacteria, UA: NONE SEEN
Bilirubin Urine: NEGATIVE
Glucose, UA: NEGATIVE mg/dL
Leukocytes,Ua: NEGATIVE
Nitrite: NEGATIVE
Protein, ur: NEGATIVE mg/dL
Specific Gravity, Urine: 1.01 (ref 1.005–1.030)
pH: 6 (ref 5.0–8.0)

## 2021-03-31 LAB — CBC
HCT: 35.5 % — ABNORMAL LOW (ref 36.0–46.0)
Hemoglobin: 12.4 g/dL (ref 12.0–15.0)
MCH: 30.3 pg (ref 26.0–34.0)
MCHC: 34.9 g/dL (ref 30.0–36.0)
MCV: 86.8 fL (ref 80.0–100.0)
Platelets: 145 10*3/uL — ABNORMAL LOW (ref 150–400)
RBC: 4.09 MIL/uL (ref 3.87–5.11)
RDW: 13.7 % (ref 11.5–15.5)
WBC: 8.8 10*3/uL (ref 4.0–10.5)
nRBC: 0 % (ref 0.0–0.2)

## 2021-03-31 MED ORDER — ACETAMINOPHEN 500 MG PO TABS
1000.0000 mg | ORAL_TABLET | Freq: Four times a day (QID) | ORAL | Status: DC | PRN
  Administered 2021-04-01: 09:00:00 1000 mg via ORAL
  Filled 2021-03-31: qty 2

## 2021-03-31 NOTE — Anesthesia Post-op Follow-up Note (Signed)
°  Anesthesia Pain Follow-up Note  Patient: Sandy [name]  Day #: 1  Date of Follow-up: 03/31/2021 Time: 7:27 AM  Last Vitals:  Vitals:   03/31/21 0456 03/31/21 0611  BP:    Pulse: 91 84  Resp:    Temp:    SpO2:      Level of Consciousness: alert  Pain: none   Side Effects:None  Catheter Site Exam:clean, dry, no drainage     Plan: D/C from anesthesia care at surgeon's request  Jaymie Mckiddy

## 2021-03-31 NOTE — Anesthesia Postprocedure Evaluation (Signed)
Anesthesia Post Note  Patient: Sandy [name]  Procedure(s) Performed: CESAREAN SECTION  Patient location during evaluation: Mother Baby Anesthesia Type: Spinal Level of consciousness: oriented and awake and alert Pain management: pain level controlled Vital Signs Assessment: post-procedure vital signs reviewed and stable Respiratory status: spontaneous breathing and respiratory function stable Cardiovascular status: blood pressure returned to baseline and stable Postop Assessment: no headache, no backache, no apparent nausea or vomiting and able to ambulate Anesthetic complications: no   No notable events documented.   Last Vitals:  Vitals:   03/31/21 0456 03/31/21 0611  BP:    Pulse: 91 84  Resp:    Temp:    SpO2:      Last Pain:  Vitals:   03/31/21 0611  TempSrc:   PainSc: 8                  Feige Lowdermilk [address]

## 2021-03-31 NOTE — Progress Notes (Signed)
Obstetric Postpartum/PostOperative Daily Progress Note Subjective:  30 y.o. [id number] post-operative day # 1 status post repeat cesarean delivery.  She is ambulating, is tolerating po, is voiding spontaneously.  Her pain is well controlled on PO pain medications and On Q pump. Her lochia is less than menses. She mentions pain/irritation with urination.   Medications SCHEDULED MEDICATIONS   prenatal multivitamin  1 tablet Oral Q1200   senna-docusate  2 tablet Oral Q24H   simethicone  80 mg Oral TID PC    MEDICATION INFUSIONS   lactated ringers Stopped (03/31/21 0403)   naLOXone (NARCAN) adult infusion for PRURITIS      PRN MEDICATIONS  coconut oil, witch hazel-glycerin **AND** dibucaine, diphenhydrAMINE **OR** diphenhydrAMINE, diphenhydrAMINE, HYDROmorphone, menthol-cetylpyridinium, nalbuphine, naloxone **AND** sodium chloride flush, naLOXone (NARCAN) adult infusion for PRURITIS, ondansetron (ZOFRAN) IV, simethicone, traMADol    Objective:   Vitals:   03/31/21 0400 03/31/21 0456 03/31/21 0611 03/31/21 0826  BP:    122/81  Pulse: 72 91 84 86  Resp:    18  Temp:    98.1 F (36.7 C)  TempSrc:    Oral  SpO2:    100%  Weight:      Height:        Current Vital Signs 24h Vital Sign Ranges  T 98.1 F (36.7 C) Temp  Avg: 98 F (36.7 C)  Min: 97.6 F (36.4 C)  Max: 98.5 F (36.9 C)  BP 122/81 BP  Min: 108/75  Max: 127/91  HR 86 Pulse  Avg: 75.5  Min: 57  Max: 93  RR 18 Resp  Avg: 16.7  Min: 11  Max: 21  SaO2 100 % Room Air SpO2  Avg: 97.7 %  Min: 94 %  Max: 100 %       24 Hour I/O Current Shift I/O  Time Ins Outs 12/22 0701 - 12/23 0700 In: 3529.5 [I.V.:3479.5] Out: 2720 [Urine:2020] No intake/output data recorded.  General: NAD Pulmonary: no increased work of breathing Abdomen: non-distended, non-tender, fundus firm at level of umbilicus Inc: Clean/dry/intact/vertical, On Q intact Extremities: no edema, no erythema, no tenderness  Labs:  Recent Labs  Lab 03/24/21 2053  03/28/21 0817 03/31/21 0535  WBC 5.9 4.3 8.8  HGB 11.6* 11.5* 12.4  HCT 32.1* 33.5* 35.5*  PLT 149* 157 145*     Assessment:   30 y.o. G4P4004 postoperative day # 1 status post repeat cesarean section  Plan:  1) Acute blood loss anemia - hemodynamically stable and asymptomatic - po ferrous sulfate  2) A POS / Rubella <0.90 (10/13 1013)/ Varicella Immune  3) TDAP status given antepartum  4) Formula feeding/Contraception = oral contraceptives (estrogen/progesterone) to start at 5-6 weeks postpartum (higher dose estrogen)  5) Dysuria: UA ordered  6) Disposition: continue current care   Tresea Mall, CNM 03/31/2021 10:11 AM

## 2021-03-31 NOTE — Anesthesia Preprocedure Evaluation (Signed)
Anesthesia Evaluation  Patient identified by MRN, date of birth, ID band Patient awake    Reviewed: Allergy & Precautions, H&P , NPO status , Patient's Chart, lab work & pertinent test results, reviewed documented beta blocker date and time   History of Anesthesia Complications (+) Family history of anesthesia reaction and history of anesthetic complications  Airway Mallampati: III  TM Distance: >3 FB Neck ROM: full    Dental no notable dental hx. (+) Teeth Intact   Pulmonary former smoker,    Pulmonary exam normal breath sounds clear to auscultation       Cardiovascular Exercise Tolerance: Good negative cardio ROS   Rhythm:regular Rate:Normal     Neuro/Psych  Headaches, CVA, No Residual Symptoms negative psych ROS   GI/Hepatic Neg liver ROS, GERD  Medicated,  Endo/Other  negative endocrine ROSdiabetes  Renal/GU      Musculoskeletal   Abdominal   Peds  Hematology negative hematology ROS (+)   Anesthesia Other Findings   Reproductive/Obstetrics (+) Pregnancy                             Anesthesia Physical Anesthesia Plan  ASA: 3  Anesthesia Plan: Epidural   Post-op Pain Management:    Induction:   PONV Risk Score and Plan:   Airway Management Planned:   Additional Equipment:   Intra-op Plan:   Post-operative Plan:   Informed Consent: I have reviewed the patients History and Physical, chart, labs and discussed the procedure including the risks, benefits and alternatives for the proposed anesthesia with the patient or authorized representative who has indicated his/her understanding and acceptance.       Plan Discussed with:   Anesthesia Plan Comments:         Anesthesia Quick Evaluation

## 2021-04-01 MED ORDER — HYDROMORPHONE HCL 4 MG PO TABS: 4.0000 mg | ORAL_TABLET | ORAL | 0 refills | Status: DC | PRN

## 2021-04-01 MED ORDER — TRAMADOL HCL 50 MG PO TABS: 50.0000 mg | ORAL_TABLET | Freq: Four times a day (QID) | ORAL | 0 refills | Status: DC | PRN

## 2021-04-01 MED ORDER — ACETAMINOPHEN 500 MG PO TABS: 1000.0000 mg | ORAL_TABLET | Freq: Four times a day (QID) | ORAL | 0 refills | Status: DC | PRN

## 2021-04-01 MED ORDER — NORETHINDRONE 0.35 MG PO TABS: 1.0000 | ORAL_TABLET | Freq: Every day | ORAL | 11 refills | Status: DC

## 2021-04-01 NOTE — Discharge Instructions (Signed)
Discharge Instructions:   Follow-up Appointment: 1 week, please call the office to schedule  If there are any new medications, they have been ordered and will be available for pickup at the listed pharmacy on your way home from the hospital.   Call the office if you have any of the following: headache, visual changes, fever >101.0 F, chills, shortness of breath, breast concerns, excessive vaginal bleeding, incision drainage or problems, leg pain or redness, depression or any other concerns. If you have vaginal discharge with an odor, let your doctor know.   It is normal to bleed for up to 6 weeks. You should not soak through more than 1 pad in 1 hour. If you have a blood clot larger than your fist with continued bleeding, call your doctor.   After a c-section, you should expect a small amount of blood or clear fluid coming from the incision and abdominal cramping/soreness. Inspect your incision site daily. Stand in front of a mirror to look for any redness, incision opening, or discolored/odorness drainage. Take a shower daily and continue good hygiene. Use own towel and washcloth (do not share). Make sure your sheets on your bed are clean. No pets sleeping around your incision site. Dressing will be removed at your postpartum visit. If the dressing does become wet or soiled underneath, it is okay to remove it before your visit.    On-Q pump: You will remove on day 4 after insertion or if the ball becomes flat before day 4. You will remove on: 04/03/2021  Activity: Do not lift > 15 lbs for 6 weeks (do not lift anything heavier than your baby). No intercourse, tampons, swimming pools, hot tubs, baths (only showers) for 6 weeks.  No driving for 1-2 weeks. Do not drive while taking narcotic or opioid pain medication.  Continue taking your prenatal vitamin, especially if breastfeeding. Increase calories and fluids (water) while breastfeeding.   Your milk will come in, in the next couple of days  (right now it is colostrum). You may have a slight fever when your milk comes in, but it should go away on its own.  If it does not, and rises above 101 F please call the doctor. You will also feel achy and your breasts will be firm. They will also start to leak. If you are breastfeeding, continue as you have been and you can pump/express milk for comfort.   If you have too much milk, your breasts can become engorged, which could lead to mastitis. This is an infection of the milk ducts. It can be very painful and you will need to notify your doctor to obtain a prescription for antibiotics. You can also treat it with a shower or hot/cold compress.   For concerns about your baby, please call your pediatrician.  For breastfeeding concerns, the lactation consultant can be reached at [phone].   Postpartum blues (feelings of happy one minute and sad another minute) are normal for the first few weeks but if it gets worse let your doctor know.   Congratulations! We enjoyed caring for you and your new bundle of joy!

## 2021-04-01 NOTE — Progress Notes (Signed)
Patient discharged with infant. Discharge instructions, prescriptions, and follow up appointments given to and reviewed with patient. Patient verbalized understanding. Will be escorted out by axillary.  °

## 2021-04-01 NOTE — Progress Notes (Signed)
°  Subjective:   She reports she is feeling well. Her pain is controlled. She is voiding and passing gas. She is tolerating a regular diet.   Objective:  Blood pressure 125/87, pulse 83, temperature 98 F (36.7 C), temperature source Oral, resp. rate 20, height 5' 5.5" (1.664 m), weight 103.4 kg, last menstrual period 06/14/2020, SpO2 98 %, unknown if currently breastfeeding.  General: NAD Pulmonary: no increased work of breathing Abdomen: non-distended, non-tender, fundus firm at level of umbilicus Incision: clean dry and intact Extremities: no edema, no erythema, no tenderness  Results for orders placed or performed during the hospital encounter of 03/30/21 (from the past 72 hour(s))  CBC     Status: Abnormal   Collection Time: 03/31/21  5:35 AM  Result Value Ref Range   WBC 8.8 4.0 - 10.5 K/uL   RBC 4.09 3.87 - 5.11 MIL/uL   Hemoglobin 12.4 12.0 - 15.0 g/dL   HCT 16.1 (L) 09.6 - 04.5 %   MCV 86.8 80.0 - 100.0 fL   MCH 30.3 26.0 - 34.0 pg   MCHC 34.9 30.0 - 36.0 g/dL   RDW 40.9 81.1 - 91.4 %   Platelets 145 (L) 150 - 400 K/uL   nRBC 0.0 0.0 - 0.2 %    Comment: Performed at [facility], [address] [place], [place] [address]  Urinalysis, Complete w Microscopic Urine, Clean Catch     Status: Abnormal   Collection Time: 03/31/21 12:00 PM  Result Value Ref Range   Color, Urine YELLOW YELLOW   APPearance CLEAR CLEAR   Specific Gravity, Urine 1.010 1.005 - 1.030   pH 6.0 5.0 - 8.0   Glucose, UA NEGATIVE NEGATIVE mg/dL   Hgb urine dipstick MODERATE (A) NEGATIVE   Bilirubin Urine NEGATIVE NEGATIVE   Ketones, ur TRACE (A) NEGATIVE mg/dL   Protein, ur NEGATIVE NEGATIVE mg/dL   Nitrite NEGATIVE NEGATIVE   Leukocytes,Ua NEGATIVE NEGATIVE   Squamous Epithelial / LPF 0-5 0 - 5   WBC, UA 0-5 0 - 5 WBC/hpf   RBC / HPF 0-5 0 - 5 RBC/hpf   Bacteria, UA NONE SEEN NONE SEEN    Comment: Performed at [facility], [address] [place], [place] [address]      Assessment:   30 y.o. [id number] postoperativeday # 2   Plan:  1) Acute blood loss anemia - hemodynamically stable and asymptomatic - po ferrous sulfate  2) Blood Type --/--/A POS (12/20 0817)   3) Rubella <0.90 (10/13 1013) / Varicella Immune  4) TDAP status - up to date Immunization History  Administered Date(s) Administered   Influenza, Seasonal, Injecte, Preservative Fre 02/01/2010   Tdap 06/13/2010, 02/03/2021    5) Feeding- bottle   6) Contraception - desires POP  7) Disposition - home today  Adelene Idler MD, [facility], [facility] 04/01/2021 9:48 AM

## 2021-04-11 ENCOUNTER — Other Ambulatory Visit: Payer: Self-pay

## 2021-04-11 ENCOUNTER — Encounter: Payer: Self-pay | Admitting: Obstetrics and Gynecology

## 2021-04-11 ENCOUNTER — Ambulatory Visit (INDEPENDENT_AMBULATORY_CARE_PROVIDER_SITE_OTHER): Payer: Medicaid Other | Admitting: Obstetrics and Gynecology

## 2021-04-11 VITALS — BP 100/62 | Ht 65.5 in | Wt 192.0 lb

## 2021-04-11 DIAGNOSIS — Z4889 Encounter for other specified surgical aftercare: Secondary | ICD-10-CM

## 2021-04-11 NOTE — Progress Notes (Signed)
° ° ° ° °  Postoperative Follow-up Patient presents post op from 1.5 weeks ago for  prior cesarean section .  Subjective: Patient reports some improvement in her preop symptoms. Eating a regular diet without difficulty. Pain is controlled without any medications.  Activity: normal activities of daily living.  Objective: Blood pressure 100/62, height 5' 5.5" (1.664 m), weight 192 lb (87.1 kg), not currently breastfeeding.  General: NAD Pulmonary: no increased work of breathing Abdomen: soft, non-tender, non-distended, incision(s) D/C/I, there is an area of induration superior aspect of the incision 4x4cm.  Non-mobile.  Does not reduce, does not become more pronounced with straining. Extremities: no edema Neurologic: normal gait  Admission on 03/30/2021, Discharged on 04/01/2021  Component Date Value Ref Range Status   WBC 03/31/2021 8.8  4.0 - 10.5 K/uL Final   RBC 03/31/2021 4.09  3.87 - 5.11 MIL/uL Final   Hemoglobin 03/31/2021 12.4  12.0 - 15.0 g/dL Final   HCT 70/62/3762 35.5 (L)  36.0 - 46.0 % Final   MCV 03/31/2021 86.8  80.0 - 100.0 fL Final   MCH 03/31/2021 30.3  26.0 - 34.0 pg Final   MCHC 03/31/2021 34.9  30.0 - 36.0 g/dL Final   RDW 83/15/1761 13.7  11.5 - 15.5 % Final   Platelets 03/31/2021 145 (L)  150 - 400 K/uL Final   nRBC 03/31/2021 0.0  0.0 - 0.2 % Final   Performed at [facility], [address] [place], [place] [address]   Color, Urine 03/31/2021 YELLOW  YELLOW Final   APPearance 03/31/2021 CLEAR  CLEAR Final   Specific Gravity, Urine 03/31/2021 1.010  1.005 - 1.030 Final   pH 03/31/2021 6.0  5.0 - 8.0 Final   Glucose, UA 03/31/2021 NEGATIVE  NEGATIVE mg/dL Final   Hgb urine dipstick 03/31/2021 MODERATE (A)  NEGATIVE Final   Bilirubin Urine 03/31/2021 NEGATIVE  NEGATIVE Final   Ketones, ur 03/31/2021 TRACE (A)  NEGATIVE mg/dL Final   Protein, ur 10/62/6948 NEGATIVE  NEGATIVE mg/dL Final   Nitrite 54/62/7035 NEGATIVE  NEGATIVE Final   Leukocytes,Ua  03/31/2021 NEGATIVE  NEGATIVE Final   Squamous Epithelial / LPF 03/31/2021 0-5  0 - 5 Final   WBC, UA 03/31/2021 0-5  0 - 5 WBC/hpf Final   RBC / HPF 03/31/2021 0-5  0 - 5 RBC/hpf Final   Bacteria, UA 03/31/2021 NONE SEEN  NONE SEEN Final   Performed at [facility], [address] [place], [place] [address]    Assessment: 31 y.o. s/p RLTCS stable  Plan: Patient has done well after surgery with no apparent complications.  I have discussed the post-operative course to date, and the expected progress moving forward.  The patient understands what complications to be concerned about.  I will see the patient in routine follow up, or sooner if needed.    Activity plan: No heavy lifting.  Not breast feeding plan LoLoestrin OCP at 6 weeks   Vena Austria, MD, [facility], [facility] 04/11/2021, 11:01 AM

## 2021-05-09 ENCOUNTER — Ambulatory Visit (INDEPENDENT_AMBULATORY_CARE_PROVIDER_SITE_OTHER): Payer: Medicaid Other | Admitting: Obstetrics and Gynecology

## 2021-05-09 ENCOUNTER — Other Ambulatory Visit: Payer: Self-pay

## 2021-05-09 DIAGNOSIS — Z30011 Encounter for initial prescription of contraceptive pills: Secondary | ICD-10-CM

## 2021-05-09 MED ORDER — NORETHINDRONE ACET-ETHINYL EST 1.5-30 MG-MCG PO TABS: 1.0000 | ORAL_TABLET | Freq: Every day | ORAL | 11 refills | Status: DC

## 2021-05-09 NOTE — Progress Notes (Signed)
Postpartum Visit  Chief Complaint:  Chief Complaint  Patient presents with   Postpartum Care    History of Present Illness: Patient is a 31 y.o. [id number] presents for postpartum visit.  Date of delivery: 03/30/2021  Type of delivery: cesarean   Breast Feeding:  no Lochia: normal  Edinburgh Post-Partum Depression Score: 0  Date of last PAP: 08/17/2020 NIL   She reports she has been feeling well.   Newborn Details:  SINGLETON :  1. Infant Status: Infant doing well at home with mother.   Review of Systems: Review of Systems  Constitutional:  Negative for chills, fever, malaise/fatigue and weight loss.  HENT:  Negative for congestion, hearing loss and sinus pain.   Eyes:  Negative for blurred vision and double vision.  Respiratory:  Negative for cough, sputum production, shortness of breath and wheezing.   Cardiovascular:  Negative for chest pain, palpitations, orthopnea and leg swelling.  Gastrointestinal:  Negative for abdominal pain, constipation, diarrhea, nausea and vomiting.  Genitourinary:  Negative for dysuria, flank pain, frequency, hematuria and urgency.  Musculoskeletal:  Negative for back pain, falls and joint pain.  Skin:  Negative for itching and rash.  Neurological:  Negative for dizziness and headaches.  Psychiatric/Behavioral:  Negative for depression, substance abuse and suicidal ideas. The patient is not nervous/anxious.    Past Medical History:  Past Medical History:  Diagnosis Date   Bicornate uterus    Complication of anesthesia    itching after C-Sections   Family history of adverse reaction to anesthesia    uncle has a hard time waking up   Genital HSV    GERD (gastroesophageal reflux disease)    Migraine    Obesity affecting pregnancy    BMI>40    Past Surgical History:  Past Surgical History:  Procedure Laterality Date   CESAREAN SECTION  2012   for HSV outbreak   CESAREAN SECTION N/A 11/29/2015   Procedure: CESAREAN SECTION Baby Girl  7lb.Isidoro Donning.;  Surgeon: Malachy Mood, MD;  Location: [facility];  Service: Obstetrics;  Laterality: N/A;   CESAREAN SECTION N/A 12/18/2016   Procedure: CESAREAN SECTION;  Surgeon: Will Bonnet, MD;  Location: [facility];  Service: Obstetrics;  Laterality: N/A;   CESAREAN SECTION N/A 03/30/2021   Procedure: CESAREAN SECTION;  Surgeon: Malachy Mood, MD;  Location: [facility];  Service: Obstetrics;  Laterality: N/A;   ears Bilateral    tubes   ESOPHAGOGASTRODUODENOSCOPY (EGD) WITH PROPOFOL N/A 05/04/2016   Procedure: ESOPHAGOGASTRODUODENOSCOPY (EGD) WITH PROPOFOL;  Surgeon: Lucilla Lame, MD;  Location: [facility];  Service: Endoscopy;  Laterality: N/A;   HERNIA REPAIR  06/18/2019   roux en y  05/2020   STENT PLACEMENT ILIAC ([place] HX) Right    leg   TONSILLECTOMY      Family History:  Family History  Problem Relation Age of Onset   Diabetes Maternal Aunt    Hyperlipidemia Maternal Aunt     Social History:  Social History   Socioeconomic History   Marital status: Widowed    Spouse name: Not on file   Number of children: Not on file   Years of education: Not on file   Highest education level: Not on file  Occupational History   Not on file  Tobacco Use   Smoking status: Former    Types: Cigarettes    Quit date: 04/09/2013    Years since quitting: 8.0   Smokeless tobacco: Never  Vaping Use   Vaping Use: Never used  Substance and Sexual Activity   Alcohol use: Not Currently    Comment: 2x/month, none during pregnacy   Drug use: No   Sexual activity: Yes    Birth control/protection: Pill  Other Topics Concern   Not on file  Social History Narrative   Not on file   Social Determinants of Health   Financial Resource Strain: Not on file  Food Insecurity: Not on file  Transportation Needs: Not on file  Physical Activity: Not on file  Stress: Not on file  Social Connections: Not on file  Intimate Partner Violence: Not on file    Allergies:  Allergies   Allergen Reactions   Oxycodone-Acetaminophen Hives and Itching   Nsaids     Gastric Bypass    Fluticasone Swelling    Flonase  Mouth swelling    Medications: Prior to Admission medications   Medication Sig Start Date End Date Taking? Authorizing Provider  acetaminophen (TYLENOL) 500 MG tablet Take 2 tablets (1,000 mg total) by mouth every 6 (six) hours as needed for fever or headache. 04/01/21   Nyana Haren, Stefanie Libel, MD  cholecalciferol (VITAMIN D3) 25 MCG (1000 UNIT) tablet Take 2,000 Units by mouth daily.    [provider]  HYDROmorphone (DILAUDID) 4 MG tablet Take 1 tablet (4 mg total) by mouth every 4 (four) hours as needed for severe pain ((when tolerating fluids)). Patient not taking: Reported on 04/11/2021 04/01/21   Homero Fellers, MD  norethindrone (CAMILA) 0.35 MG tablet Take 1 tablet (0.35 mg total) by mouth daily. 04/01/21   Sharmayne Jablon, Stefanie Libel, MD  Prenatal Vit-Fe Fumarate-FA (PRENATAL MULTIVITAMIN) TABS tablet Take 2 tablets by mouth daily at 12 noon. 2 Gummies a day    [provider]  traMADol (ULTRAM) 50 MG tablet Take 1 tablet (50 mg total) by mouth every 6 (six) hours as needed for moderate pain or severe pain. Patient not taking: Reported on 04/11/2021 04/01/21   Homero Fellers, MD    Physical Exam Vitals:  Vitals:   05/09/21 0916  BP: 120/70    Physical Exam Constitutional:      Appearance: Normal appearance. She is well-developed.  HENT:     Head: Normocephalic and atraumatic.  Eyes:     Extraocular Movements: Extraocular movements intact.     Pupils: Pupils are equal, round, and reactive to light.  Neck:     Thyroid: No thyromegaly.  Cardiovascular:     Rate and Rhythm: Normal rate and regular rhythm.     Heart sounds: Normal heart sounds.  Pulmonary:     Effort: Pulmonary effort is normal.     Breath sounds: Normal breath sounds.  Abdominal:     General: Bowel sounds are normal. There is no distension.      Palpations: Abdomen is soft. There is no mass.     Comments: Incision is clean dry and intact  Musculoskeletal:     Cervical back: Neck supple.  Neurological:     Mental Status: She is alert and oriented to person, place, and time.  Skin:    General: Skin is warm and dry.  Psychiatric:        Behavior: Behavior normal.        Thought Content: Thought content normal.        Judgment: Judgment normal.  Vitals reviewed.    Assessment: 31 y.o. [id number] presenting for 6 week postpartum visit  Plan: Problem List Items Addressed This Visit   None Visit Diagnoses  Postpartum care and examination    -  Primary   Encounter for BCP (birth control pills) initial prescription           1) Contraception-  desires to be on an OCP, understands that it is not as effective because of her history of bariatric surgery. She denies history of CVA per [facility] neurology.   2)  Pap: up to date  3) Patient underwent screening for postpartum depression with no concerns noted.  - Follow up as needed   Adrian Prows MD, [place], [facility] 05/09/2021 9:40 AM

## 2021-05-15 ENCOUNTER — Telehealth: Payer: Self-pay

## 2021-05-15 NOTE — Telephone Encounter (Signed)
[place] calling from [facility] for more information.  [phone]  Debbe Odea given information.

## 2021-06-02 ENCOUNTER — Encounter: Payer: Self-pay | Admitting: Obstetrics and Gynecology

## 2021-06-28 ENCOUNTER — Encounter (HOSPITAL_COMMUNITY): Payer: Self-pay | Admitting: Obstetrics and Gynecology

## 2021-06-28 ENCOUNTER — Inpatient Hospital Stay (HOSPITAL_COMMUNITY): Payer: Medicaid Other

## 2021-06-28 ENCOUNTER — Other Ambulatory Visit: Payer: Self-pay

## 2021-06-28 ENCOUNTER — Inpatient Hospital Stay (HOSPITAL_COMMUNITY)
Admission: AD | Admit: 2021-06-28 | Discharge: 2021-06-28 | Disposition: A | Discharge status: Home / Self Care | Payer: Medicaid Other | Attending: Obstetrics and Gynecology | Admitting: Obstetrics and Gynecology

## 2021-06-28 DIAGNOSIS — R109 Unspecified abdominal pain: Secondary | ICD-10-CM

## 2021-06-28 DIAGNOSIS — R1030 Lower abdominal pain, unspecified: Secondary | ICD-10-CM | POA: Insufficient documentation

## 2021-06-28 DIAGNOSIS — Z87891 Personal history of nicotine dependence: Secondary | ICD-10-CM | POA: Insufficient documentation

## 2021-06-28 DIAGNOSIS — Z3A01 Less than 8 weeks gestation of pregnancy: Secondary | ICD-10-CM

## 2021-06-28 DIAGNOSIS — O3680X0 Pregnancy with inconclusive fetal viability, not applicable or unspecified: Secondary | ICD-10-CM | POA: Diagnosis not present

## 2021-06-28 DIAGNOSIS — O26891 Other specified pregnancy related conditions, first trimester: Secondary | ICD-10-CM | POA: Diagnosis not present

## 2021-06-28 LAB — CBC
HCT: 35.9 % — ABNORMAL LOW (ref 36.0–46.0)
Hemoglobin: 12.5 g/dL (ref 12.0–15.0)
MCH: 29.3 pg (ref 26.0–34.0)
MCHC: 34.8 g/dL (ref 30.0–36.0)
MCV: 84.1 fL (ref 80.0–100.0)
Platelets: 205 10*3/uL (ref 150–400)
RBC: 4.27 MIL/uL (ref 3.87–5.11)
RDW: 14.1 % (ref 11.5–15.5)
WBC: 4.8 10*3/uL (ref 4.0–10.5)
nRBC: 0 % (ref 0.0–0.2)

## 2021-06-28 LAB — URINALYSIS, ROUTINE W REFLEX MICROSCOPIC
Bilirubin Urine: NEGATIVE
Glucose, UA: NEGATIVE mg/dL
Hgb urine dipstick: NEGATIVE
Ketones, ur: NEGATIVE mg/dL
Leukocytes,Ua: NEGATIVE
Nitrite: NEGATIVE
Protein, ur: NEGATIVE mg/dL
Specific Gravity, Urine: 1.025 (ref 1.005–1.030)
pH: 5 (ref 5.0–8.0)

## 2021-06-28 LAB — WET PREP, GENITAL
Sperm: NONE SEEN
Trich, Wet Prep: NONE SEEN
WBC, Wet Prep HPF POC: >=10 — AB (ref <10)
Yeast Wet Prep HPF POC: NONE SEEN

## 2021-06-28 LAB — POCT PREGNANCY, URINE: Preg Test, Ur: POSITIVE — AB

## 2021-06-28 LAB — HCG, QUANTITATIVE, PREGNANCY: hCG, Beta Chain, Quant, S: 562 m[IU]/mL — ABNORMAL HIGH (ref <5)

## 2021-06-28 NOTE — Discharge Instructions (Signed)

## 2021-06-28 NOTE — MAU Provider Note (Signed)
?History  ?  ? ?CSN: [id number] ? ?Arrival date and time: 06/28/21 2024 ? ? Event Date/Time  ? First Provider Initiated Contact with Patient 06/28/21 2215   ?  ? ?Chief Complaint  ?Patient presents with  ? Abdominal Pain  ? ?HPI ?Sandy [name] is a 31 y.o. [id number] at [redacted]w[redacted]d who presents with lower abdominal pain. She reports a pulling lower abdominal pain for 3 weeks. She denies any bleeding or abnormal discharge. She reports a positive home pregnancy test today. She is worried because she had a c/s in December and is concerned about these closely spaced pregnancies.  ? ?OB History   ? ? Gravida  ?5  ? Para  ?4  ? Term  ?4  ? Preterm  ?   ? AB  ?   ? Living  ?4  ?  ? ? SAB  ?   ? IAB  ?   ? Ectopic  ?   ? Multiple  ?0  ? Live Births  ?4  ?   ?  ?  ? ? ?Past Medical History:  ?Diagnosis Date  ? Bicornate uterus   ? Complication of anesthesia   ? itching after C-Sections  ? Family history of adverse reaction to anesthesia   ? uncle has a hard time waking up  ? Genital HSV   ? GERD (gastroesophageal reflux disease)   ? Migraine   ? Obesity affecting pregnancy   ? BMI>40  ? ? ?Past Surgical History:  ?Procedure Laterality Date  ? CESAREAN SECTION  2012  ? for HSV outbreak  ? CESAREAN SECTION N/A 11/29/2015  ? Procedure: CESAREAN SECTION Baby Girl 7lb.[name];  Surgeon: Vena Austria, MD;  Location: [facility] ORS;  Service: Obstetrics;  Laterality: N/A;  ? CESAREAN SECTION N/A 12/18/2016  ? Procedure: CESAREAN SECTION;  Surgeon: Conard Novak, MD;  Location: [facility] ORS;  Service: Obstetrics;  Laterality: N/A;  ? CESAREAN SECTION N/A 03/30/2021  ? Procedure: CESAREAN SECTION;  Surgeon: Vena Austria, MD;  Location: [facility] OR[facility];  Service: Obstetrics;  Laterality: N/A;  ? ears Bilateral   ? tubes  ? ESOPHAGOGASTRODUODENOSCOPY (EGD) WITH PROPOFOL N/A 05/04/2016  ? Procedure: ESOPHAGOGASTRODUODENOSCOPY (EGD) WITH PROPOFOL;  Surgeon: Midge Minium, MD;  Location: [facility];  Service: Endoscopy;  Laterality: N/A;  ?  HERNIA REPAIR  06/18/2019  ? roux en y  05/2020  ? STENT PLACEMENT ILIAC ([facility] HX) Right   ? leg  ? TONSILLECTOMY    ? ? ?Family History  ?Problem Relation Age of Onset  ? Diabetes Maternal Aunt   ? Hyperlipidemia Maternal Aunt   ? ? ?Social History  ? ?Tobacco Use  ? Smoking status: Former  ?  Types: Cigarettes  ?  Quit date: 04/09/2013  ?  Years since quitting: 8.2  ? Smokeless tobacco: Never  ?Vaping Use  ? Vaping Use: Never used  ?Substance Use Topics  ? Alcohol use: Not Currently  ?  Comment: 2x/month, none during pregnacy  ? Drug use: No  ? ? ?Allergies:  ?Allergies  ?Allergen Reactions  ? Oxycodone-Acetaminophen Hives and Itching  ? Nsaids   ?  Gastric Bypass   ? Fluticasone Swelling  ?  Flonase  ?Mouth swelling  ? ? ?Medications Prior to Admission  ?Medication Sig Dispense Refill Last Dose  ? acetaminophen (TYLENOL) 500 MG tablet Take 2 tablets (1,000 mg total) by mouth every 6 (six) hours as needed for fever or headache. 100 tablet 0 Past Week  ? cholecalciferol (  VITAMIN D3) 25 MCG (1000 UNIT) tablet Take 2,000 Units by mouth daily.   06/28/2021  ? norethindrone (CAMILA) 0.35 MG tablet Take 1 tablet (0.35 mg total) by mouth daily. 28 tablet 11 06/28/2021  ? Norethindrone Acetate-Ethinyl Estradiol (JUNEL 1.5/30) 1.5-30 MG-MCG tablet Take 1 tablet by mouth daily. 28 tablet 11 06/28/2021  ? Prenatal Vit-Fe Fumarate-FA (PRENATAL MULTIVITAMIN) TABS tablet Take 2 tablets by mouth daily at 12 noon. 2 Gummies a day   06/28/2021  ? HYDROmorphone (DILAUDID) 4 MG tablet Take 1 tablet (4 mg total) by mouth every 4 (four) hours as needed for severe pain ((when tolerating fluids)). 30 tablet 0 More than a month  ? traMADol (ULTRAM) 50 MG tablet Take 1 tablet (50 mg total) by mouth every 6 (six) hours as needed for moderate pain or severe pain. (Patient not taking: Reported on 04/11/2021) 30 tablet 0 More than a month  ? ? ?Review of Systems  ?Constitutional: Negative.  Negative for fatigue and fever.  ?HENT: Negative.     ?Respiratory: Negative.  Negative for shortness of breath.   ?Cardiovascular: Negative.  Negative for chest pain.  ?Gastrointestinal:  Positive for abdominal pain. Negative for constipation, diarrhea, nausea and vomiting.  ?Genitourinary: Negative.  Negative for dysuria, vaginal bleeding and vaginal discharge.  ?Neurological: Negative.  Negative for dizziness and headaches.  ?Physical Exam  ? ?Blood pressure 116/60, pulse 66, temperature 98.9 ?F (37.2 ?C), temperature source Oral, resp. rate 17, height 5\' 5"  (1.651 m), weight 83.5 kg, last menstrual period 05/07/2021, SpO2 100 %, not currently breastfeeding. ? ?Physical Exam ?Vitals and nursing note reviewed.  ?Constitutional:   ?   General: She is not in acute distress. ?   Appearance: She is well-developed.  ?HENT:  ?   Head: Normocephalic.  ?Eyes:  ?   Pupils: Pupils are equal, round, and reactive to light.  ?Cardiovascular:  ?   Rate and Rhythm: Normal rate and regular rhythm.  ?   Heart sounds: Normal heart sounds.  ?Pulmonary:  ?   Effort: Pulmonary effort is normal. No respiratory distress.  ?   Breath sounds: Normal breath sounds.  ?Abdominal:  ?   General: Bowel sounds are normal. There is no distension.  ?   Palpations: Abdomen is soft.  ?   Tenderness: There is no abdominal tenderness.  ?Skin: ?   General: Skin is warm and dry.  ?Neurological:  ?   Mental Status: She is alert and oriented to person, place, and time.  ?Psychiatric:     ?   Mood and Affect: Mood normal.     ?   Behavior: Behavior normal.     ?   Thought Content: Thought content normal.     ?   Judgment: Judgment normal.  ? ? ?MAU Course  ?Procedures ?Results for orders placed or performed during the hospital encounter of 06/28/21 (from the past 24 hour(s))  ?Urinalysis, Routine w reflex microscopic     Status: None  ? Collection Time: 06/28/21  8:24 PM  ?Result Value Ref Range  ? Color, Urine YELLOW YELLOW  ? APPearance CLEAR CLEAR  ? Specific Gravity, Urine 1.025 1.005 - 1.030  ? pH 5.0  5.0 - 8.0  ? Glucose, UA NEGATIVE NEGATIVE mg/dL  ? Hgb urine dipstick NEGATIVE NEGATIVE  ? Bilirubin Urine NEGATIVE NEGATIVE  ? Ketones, ur NEGATIVE NEGATIVE mg/dL  ? Protein, ur NEGATIVE NEGATIVE mg/dL  ? Nitrite NEGATIVE NEGATIVE  ? Leukocytes,Ua NEGATIVE NEGATIVE  ?Pregnancy, urine POC  Status: Abnormal  ? Collection Time: 06/28/21 10:06 PM  ?Result Value Ref Range  ? Preg Test, Ur POSITIVE (A) NEGATIVE  ?CBC     Status: Abnormal  ? Collection Time: 06/28/21 10:12 PM  ?Result Value Ref Range  ? WBC 4.8 4.0 - 10.5 K/uL  ? RBC 4.27 3.87 - 5.11 MIL/uL  ? Hemoglobin 12.5 12.0 - 15.0 g/dL  ? HCT 35.9 (L) 36.0 - 46.0 %  ? MCV 84.1 80.0 - 100.0 fL  ? MCH 29.3 26.0 - 34.0 pg  ? MCHC 34.8 30.0 - 36.0 g/dL  ? RDW 14.1 11.5 - 15.5 %  ? Platelets 205 150 - 400 K/uL  ? nRBC 0.0 0.0 - 0.2 %  ?hCG, quantitative, pregnancy     Status: Abnormal  ? Collection Time: 06/28/21 10:12 PM  ?Result Value Ref Range  ? hCG, Beta Chain, Quant, S 562 (H) <5 mIU/mL  ?Wet prep, genital     Status: Abnormal  ? Collection Time: 06/28/21 10:21 PM  ?Result Value Ref Range  ? Yeast Wet Prep HPF POC NONE SEEN NONE SEEN  ? Trich, Wet Prep NONE SEEN NONE SEEN  ? Clue Cells Wet Prep HPF POC PRESENT (A) NONE SEEN  ? WBC, Wet Prep HPF POC >=10 (A) <10  ? Sperm NONE SEEN   ?  ?US OB LESS THAN 14 WEEKS WITH OB TRANSVAGINAL ? ?Result Date: 06/28/2021 ?CLINICAL DATA:  Three weeks of right-sided pain.  Vaginal bleeding. EXAM: OBSTETRIC <14 WK US AND TRANSVAGINAL OB US TECHNIQUE: Both transabdominal and transvaginal ultrasound examinations were performed for complete evaluation of the gestation as well as the maternal uterus, adnexal regions, and pelvic cul-de-sac. Transvaginal technique was performed to assess early pregnancy. COMPARISON:  None. FINDINGS: Intrauterine gestational sac: None Endometrium: Measures 1.6 cm in thickness. No focal lesion identified. Maternal uterus/adnexae: Bilateral ovaries appear within normal limits. Corpus luteum is noted in  the right ovary. No pelvic free fluid. IMPRESSION: 1. No intrauterine gestational sac identified. Endometrium appears normal. In the setting of positive pregnancy test findings may be related to early normal IUP,

## 2021-06-28 NOTE — MAU Note (Signed)
.  Sandy [name] is a 31 y.o. at Unknown here in MAU reporting: positive pregnancy test at home today.  Pt states that she has been having three weeks for abdominal pain that is on the right side.  Pt reports that she had intercourse and had some vaginal spotting.   ?LMP: 05/07/2021 ?Onset of complaint: 3 weeks ago  ?Pain score: 2/10 ?Vitals:  ? 06/28/21 2138  ?BP: 115/73  ?Pulse: 71  ?Resp: 17  ?Temp: 98.9 ?F (37.2 ?C)  ?SpO2: 100%  ?   ?FHT: did not obtain as early gestation.   ?Lab orders placed from triage:   pregnancy test  ?

## 2021-06-29 ENCOUNTER — Other Ambulatory Visit: Payer: Self-pay | Admitting: Obstetrics & Gynecology

## 2021-06-29 ENCOUNTER — Encounter: Payer: Self-pay | Admitting: Obstetrics & Gynecology

## 2021-06-29 LAB — GC/CHLAMYDIA PROBE AMP (~~LOC~~) NOT AT ARMC
Chlamydia: NEGATIVE
Comment: NEGATIVE
Comment: NORMAL
Neisseria Gonorrhea: NEGATIVE

## 2021-06-29 MED ORDER — ONDANSETRON 4 MG PO TBDP: 4.0000 mg | ORAL_TABLET | Freq: Four times a day (QID) | ORAL | 0 refills | Status: DC | PRN

## 2021-07-01 ENCOUNTER — Encounter (HOSPITAL_COMMUNITY): Payer: Self-pay

## 2021-07-01 ENCOUNTER — Inpatient Hospital Stay (HOSPITAL_COMMUNITY): Admit: 2021-07-01 | Payer: Medicaid Other

## 2021-07-01 ENCOUNTER — Inpatient Hospital Stay (HOSPITAL_COMMUNITY)
Admission: AD | Admit: 2021-07-01 | Discharge: 2021-07-01 | Disposition: A | Discharge status: Home / Self Care | Payer: Medicaid Other | Source: Ambulatory Visit | Attending: Obstetrics and Gynecology | Admitting: Obstetrics and Gynecology

## 2021-07-01 ENCOUNTER — Other Ambulatory Visit: Payer: Self-pay

## 2021-07-01 DIAGNOSIS — Z6790 Unspecified blood type, Rh positive: Secondary | ICD-10-CM

## 2021-07-01 DIAGNOSIS — O3680X0 Pregnancy with inconclusive fetal viability, not applicable or unspecified: Secondary | ICD-10-CM | POA: Insufficient documentation

## 2021-07-01 LAB — HCG, QUANTITATIVE, PREGNANCY: hCG, Beta Chain, Quant, S: 2207 m[IU]/mL — ABNORMAL HIGH (ref <5)

## 2021-07-01 NOTE — MAU Note (Signed)
Sandy [name] is a 31 y.o. at [redacted]w[redacted]d here in MAU reporting: she needs repeat HCG level ?LMP: 05/07/21 ?Onset of complaint: N/A ?Pain score: 0 ?There were no vitals filed for this visit.   ?FHT:N/A ?Lab orders placed from triage:   HCG level ?

## 2021-07-01 NOTE — Discharge Instructions (Signed)

## 2021-07-01 NOTE — MAU Provider Note (Signed)
Subjective:  ?[name] is a 31 y.o. [id number] at [redacted]w[redacted]d who presents today for FU BHCG. She was seen on 06/28/2021. Results from that day show no IUP on [place], and HCG 562. She denies vaginal bleeding. She denies abdominal or pelvic pain.  ? ? ?Objective:  ?Physical Exam  ?Nursing note and vitals reviewed. ? ?Patient Vitals for the past 24 hrs: ? BP Temp Temp src Pulse Resp SpO2 Height Weight  ?07/01/21 1904 126/68 98.3 ?F (36.8 ?C) Oral 82 18 100 % -- --  ?07/01/21 1859 -- -- -- -- -- -- 5' 5.5" (1.664 m) 84.8 kg  ? ?Constitutional: She is oriented to person, place, and time. She appears well-developed and well-nourished. No distress.  ?HENT:  ?Head: Normocephalic.  ?Cardiovascular: Normal rate.  ?Respiratory: Effort normal.  ?GI: Soft. There is no tenderness.  ?Neurological: She is alert and oriented to person, place, and time. ?Skin: Skin is warm and dry.  ?Psychiatric: She has a normal mood and affect.  ? ?Results for orders placed or performed during the hospital encounter of 07/01/21 (from the past 24 hour(s))  ?hCG, quantitative, pregnancy     Status: Abnormal  ? Collection Time: 07/01/21  7:17 PM  ?Result Value Ref Range  ? hCG, Beta Chain, Quant, S 2,207 (H) <5 mIU/mL  ? ?Assessment/Plan: ?Pregnancy of unknown location ?HCG did  rise appropriately ?FU in 2 days for: repeat quant (07/04/2021), message sent to [facility] to schedule, unable to schedule with new Epic scheduling platform. ?Return MAU precautions given ?Pt discharged to home in stable condition ? ?NugentGerrie Nordmann, NP  ?8:36 PM ?07/01/2021 ?

## 2021-07-03 LAB — FETAL NONSTRESS TEST

## 2021-07-04 ENCOUNTER — Telehealth: Payer: Self-pay

## 2021-07-04 ENCOUNTER — Telehealth: Payer: Self-pay | Admitting: *Deleted

## 2021-07-04 ENCOUNTER — Other Ambulatory Visit: Payer: Medicaid Other

## 2021-07-04 ENCOUNTER — Other Ambulatory Visit: Payer: Self-pay

## 2021-07-04 ENCOUNTER — Other Ambulatory Visit: Payer: Self-pay | Admitting: *Deleted

## 2021-07-04 ENCOUNTER — Ambulatory Visit: Payer: Medicaid Other

## 2021-07-04 DIAGNOSIS — O3680X0 Pregnancy with inconclusive fetal viability, not applicable or unspecified: Secondary | ICD-10-CM

## 2021-07-04 LAB — BETA HCG QUANT (REF LAB): hCG Quant: 4019 m[IU]/mL

## 2021-07-04 NOTE — Telephone Encounter (Signed)
Pt informed of HCG results and we will schedule here for a formal scan  ?

## 2021-07-04 NOTE — Telephone Encounter (Signed)
-----   Message from Tereso Newcomer, MD sent at 07/04/2021  1:15 PM EDT ----- ?Patient needs formal ultrasound given her history of bicornuate uterus, to evaluate for this early pregnancy not seen on 06/28/21 scan.   ?

## 2021-07-04 NOTE — Telephone Encounter (Signed)
Spoke to pt and gave her the appt date and time and stated to her about the full bladder rule 32 oz of water 1 hour until your appt and refrain from using the bathroom pt repeated back and she confirmed appt at 5pm even if my chart states 530pm  ?

## 2021-07-06 ENCOUNTER — Ambulatory Visit
Admission: RE | Admit: 2021-07-06 | Discharge: 2021-07-06 | Disposition: A | Discharge status: Home / Self Care | Payer: Medicaid Other | Source: Ambulatory Visit | Attending: Obstetrics & Gynecology | Admitting: Obstetrics & Gynecology

## 2021-07-06 DIAGNOSIS — O3680X0 Pregnancy with inconclusive fetal viability, not applicable or unspecified: Secondary | ICD-10-CM | POA: Insufficient documentation

## 2021-07-07 ENCOUNTER — Encounter: Payer: Self-pay | Admitting: Obstetrics & Gynecology

## 2021-07-11 ENCOUNTER — Other Ambulatory Visit: Payer: Self-pay | Admitting: *Deleted

## 2021-07-11 ENCOUNTER — Telehealth: Payer: Self-pay

## 2021-07-11 DIAGNOSIS — O3680X0 Pregnancy with inconclusive fetal viability, not applicable or unspecified: Secondary | ICD-10-CM

## 2021-07-11 NOTE — Telephone Encounter (Signed)
Notified pt of her ultrasound appt. Patient does not want ultrasound in [place]. Provided number to reschedule appt.  ?

## 2021-07-24 ENCOUNTER — Ambulatory Visit
Admission: RE | Admit: 2021-07-24 | Discharge: 2021-07-24 | Disposition: A | Discharge status: Home / Self Care | Payer: Medicaid Other | Source: Ambulatory Visit | Attending: Obstetrics & Gynecology | Admitting: Obstetrics & Gynecology

## 2021-07-24 ENCOUNTER — Ambulatory Visit: Payer: Medicaid Other

## 2021-07-24 DIAGNOSIS — O3680X0 Pregnancy with inconclusive fetal viability, not applicable or unspecified: Secondary | ICD-10-CM | POA: Diagnosis present

## 2021-08-22 DIAGNOSIS — Z8719 Personal history of other diseases of the digestive system: Secondary | ICD-10-CM | POA: Insufficient documentation

## 2021-08-22 DIAGNOSIS — Z302 Encounter for sterilization: Secondary | ICD-10-CM | POA: Insufficient documentation

## 2021-08-22 DIAGNOSIS — I871 Compression of vein: Secondary | ICD-10-CM | POA: Insufficient documentation

## 2021-08-24 ENCOUNTER — Encounter: Payer: Medicaid Other | Admitting: Advanced Practice Midwife

## 2021-08-28 DIAGNOSIS — E611 Iron deficiency: Secondary | ICD-10-CM | POA: Insufficient documentation

## 2021-09-24 ENCOUNTER — Other Ambulatory Visit: Payer: Self-pay

## 2021-09-24 ENCOUNTER — Inpatient Hospital Stay (HOSPITAL_COMMUNITY)
Admission: AD | Admit: 2021-09-24 | Discharge: 2021-09-24 | Disposition: A | Discharge status: Home / Self Care | Payer: Medicaid Other | Attending: Obstetrics & Gynecology | Admitting: Obstetrics & Gynecology

## 2021-09-24 DIAGNOSIS — O368320 Maternal care for abnormalities of the fetal heart rate or rhythm, second trimester, not applicable or unspecified: Secondary | ICD-10-CM | POA: Diagnosis not present

## 2021-09-24 DIAGNOSIS — Z3A16 16 weeks gestation of pregnancy: Secondary | ICD-10-CM | POA: Diagnosis not present

## 2021-09-24 DIAGNOSIS — Z3492 Encounter for supervision of normal pregnancy, unspecified, second trimester: Secondary | ICD-10-CM | POA: Diagnosis not present

## 2021-09-24 DIAGNOSIS — O99012 Anemia complicating pregnancy, second trimester: Secondary | ICD-10-CM | POA: Insufficient documentation

## 2021-09-24 NOTE — MAU Provider Note (Signed)
History     CSN: [id number]  Arrival date and time: 09/24/21 1635   None    Chief Complaint  Patient presents with   FHR Evaluation   HPI [facility]. Sandy [name] is a 31 y.o. [id number] at [redacted]w[redacted]d by ultrasound with PMHx of R&Y gastric bypass and c-section x4 who presents to MAU for fetal heart rate evaluation. She receives her prenatal care at [facility] Patient reports she went to have 5D ultrasound done in [place] today and was told that the FHR was 99 bpm and was told that she needed to be evaluated. She reports she went to [facility] ED and was told the wait would be 12-24 hours so she left and came here. She denies abdominal pain or vaginal bleeding. No concerning discharge, urinary s/s, or fever. She reports fatigue and headaches, however this is ongoing which she attributed to anemia. She is scheduled for IV iron infusion next week at [facility]   OB History     Gravida  5   Para  4   Term  4   Preterm      AB      Living  4      SAB      IAB      Ectopic      Multiple  0   Live Births  4           Past Medical History:  Diagnosis Date   Bicornate uterus    Complication of anesthesia    itching after C-Sections   Family history of adverse reaction to anesthesia    uncle has a hard time waking up   Genital HSV    GERD (gastroesophageal reflux disease)    Migraine    Obesity affecting pregnancy    BMI>40    Past Surgical History:  Procedure Laterality Date   CESAREAN SECTION  2012   for HSV outbreak   CESAREAN SECTION N/A 11/29/2015   Procedure: CESAREAN SECTION Baby Girl 7lb.[name];  Surgeon: Vena Austria, MD;  Location: [facility] ORS;  Service: Obstetrics;  Laterality: N/A;   CESAREAN SECTION N/A 12/18/2016   Procedure: CESAREAN SECTION;  Surgeon: Conard Novak, MD;  Location: [facility] ORS;  Service: Obstetrics;  Laterality: N/A;   CESAREAN SECTION N/A 03/30/2021   Procedure: CESAREAN SECTION;  Surgeon: Vena Austria, MD;  Location: [facility]  ORS;  Service: Obstetrics;  Laterality: N/A;   ears Bilateral    tubes   ESOPHAGOGASTRODUODENOSCOPY (EGD) WITH PROPOFOL N/A 05/04/2016   Procedure: ESOPHAGOGASTRODUODENOSCOPY (EGD) WITH PROPOFOL;  Surgeon: Midge Minium, MD;  Location: [facility];  Service: Endoscopy;  Laterality: N/A;   HERNIA REPAIR  06/18/2019   roux en y  05/2020   STENT PLACEMENT ILIAC [facility] HX) Right    leg   TONSILLECTOMY      Family History  Problem Relation Age of Onset   Diabetes Maternal Aunt    Hyperlipidemia Maternal Aunt     Social History   Tobacco Use   Smoking status: Former    Types: Cigarettes    Quit date: 04/09/2013    Years since quitting: 8.4   Smokeless tobacco: Never  Vaping Use   Vaping Use: Never used  Substance Use Topics   Alcohol use: Not Currently    Comment: 2x/month, none during pregnacy   Drug use: No    Allergies:  Allergies  Allergen Reactions   Oxycodone-Acetaminophen Hives and Itching   Nsaids     Gastric Bypass  Fluticasone Swelling    Flonase  Mouth swelling    No medications prior to admission.    Review of Systems  Constitutional:  Positive for fatigue.  Respiratory: Negative.    Cardiovascular: Negative.   Gastrointestinal: Negative.   Genitourinary: Negative.   Musculoskeletal: Negative.   Neurological:  Positive for headaches.   Physical Exam   Blood pressure 109/64, pulse 87, temperature 98.3 F (36.8 C), temperature source Oral, resp. rate 20, height 5' 5.5" (1.664 m), weight 80.9 kg, last menstrual period 05/07/2021, SpO2 99 %, not currently breastfeeding.  Physical Exam Vitals and nursing note reviewed.  Eyes:     Extraocular Movements: Extraocular movements intact.     Pupils: Pupils are equal, round, and reactive to light.  Cardiovascular:     Rate and Rhythm: Normal rate.  Pulmonary:     Effort: Pulmonary effort is normal.  Abdominal:     Palpations: Abdomen is soft.     Tenderness: There is no abdominal tenderness.   Musculoskeletal:     Cervical back: Normal range of motion.  Skin:    General: Skin is warm and dry.  Neurological:     General: No focal deficit present.     Mental Status: She is alert and oriented to person, place, and time.  Psychiatric:        Mood and Affect: Mood normal.        Behavior: Behavior normal.        Thought Content: Thought content normal.        Judgment: Judgment normal.   FHR: 154 bpm via doppler  MAU Course  Procedures  MDM FHR wnl normal limits via doppler. Reassurance provided to patient and significant other. Patient requesting discharge home and to follow up with OBGYN  Assessment and Plan  [redacted] weeks gestation of pregnancy Fetal heart tones present  - Discharge home in stable condition - Strict return precautions - Keep OB appointments at [facility] as scheduled   Brand Males, CNM 09/24/2021, 6:35 PM

## 2021-09-24 NOTE — MAU Note (Signed)
KATERIN NEGRETE is a 31 y.o. at [redacted]w[redacted]d here in MAU reporting: she went to a free standing establishment that does ultrasounds to get images and hear the baby's heartbeat.  Reports while there, [place] tech informed her heartbeat was 99 bpm and needed to f/u with medical attention.  States went to [facility] ED but was insformed would take possibly 18 hours to be seen.  Reports gets care @ [facility].  Denies VB or pain.  Onset of complaint: today Pain score: 4/10  Vitals:   09/24/21 1654  BP: 109/64  Pulse: 87  Resp: 20  Temp: 98.3 F (36.8 C)  SpO2: 99%     FHT: 154 bpm Lab orders placed from triage: None

## 2022-02-05 ENCOUNTER — Emergency Department
Admission: EM | Admit: 2022-02-05 | Discharge: 2022-02-05 | Discharge status: Against Medical Advice | Payer: Medicaid Other

## 2022-02-05 NOTE — ED Triage Notes (Signed)
Patient arrived by EMS from home with c/o bleeding from C-section site. Takes blood thinner. Reports she has bled through two towels.   A&O x4 per EMS   106/69 b/p   18G R AC and receiving 500cc LR

## 2022-02-05 NOTE — ED Triage Notes (Signed)
Pt bib ems c/o bleeding from C section site. During triage, pt states "I would like to go to [facility], they are expecting me in the L&D unit because they can do everything there. I dont want to wait here. My husband can pick me up". Pt doesn't want this RN to continue with triage.    VS in triage: 129/84 HR 73 SPO2 99%

## 2022-02-20 DIAGNOSIS — Z8659 Personal history of other mental and behavioral disorders: Secondary | ICD-10-CM | POA: Insufficient documentation

## 2022-02-20 DIAGNOSIS — Z8759 Personal history of other complications of pregnancy, childbirth and the puerperium: Secondary | ICD-10-CM | POA: Insufficient documentation

## 2022-03-21 ENCOUNTER — Telehealth: Payer: Medicaid Other | Admitting: Emergency Medicine

## 2022-03-21 DIAGNOSIS — R1013 Epigastric pain: Secondary | ICD-10-CM

## 2022-03-21 MED ORDER — ONDANSETRON 4 MG PO TBDP: 4.0000 mg | ORAL_TABLET | Freq: Three times a day (TID) | ORAL | 0 refills | Status: DC | PRN

## 2022-03-21 NOTE — Progress Notes (Signed)
Virtual Visit Consent   Sandy [name], you are scheduled for a virtual visit with a [facility] provider today. Just as with appointments in the office, your consent must be obtained to participate. Your consent will be active for this visit and any virtual visit you may have with one of our providers in the next 365 days. If you have a MyChart account, a copy of this consent can be sent to you electronically.  As this is a virtual visit, video technology does not allow for your provider to perform a traditional examination. This may limit your provider's ability to fully assess your condition. If your provider identifies any concerns that need to be evaluated in person or the need to arrange testing (such as labs, EKG, etc.), we will make arrangements to do so. Although advances in technology are sophisticated, we cannot ensure that it will always work on either your end or our end. If the connection with a video visit is poor, the visit may have to be switched to a telephone visit. With either a video or telephone visit, we are not always able to ensure that we have a secure connection.  By engaging in this virtual visit, you consent to the provision of healthcare and authorize for your insurance to be billed (if applicable) for the services provided during this visit. Depending on your insurance coverage, you may receive a charge related to this service.  I need to obtain your verbal consent now. Are you willing to proceed with your visit today? Sandy [name] has provided verbal consent on 03/21/2022 for a virtual visit (video or telephone). Roxy Horseman, PA-C  Date: 03/21/2022 1:29 PM  Virtual Visit via Video Note   I, Roxy Horseman, connected with  Sandy [name]  ([id number], 1990/12/29) on 03/21/22 at  1:30 PM EST by a video-enabled telemedicine application and verified that I am speaking with the correct person using two identifiers.  Location: Patient: Virtual Visit Location Patient:  Home Provider: Virtual Visit Location Provider: Home   I discussed the limitations of evaluation and management by telemedicine and the availability of in person appointments. The patient expressed understanding and agreed to proceed.    History of Present Illness: Sandy [name] is a 31 y.o. who identifies as a female who was assigned female at birth, and is being seen today for abdominal pain.  States that she has severe epigastric abdominal pain.  States that she has hx of pancreatitis.  States that she has been hospitalized for this in the past.  States that she has a newborn premi at home and doesn't want to go to the ER.  HPI: HPI  Problems:  Patient Active Problem List   Diagnosis Date Noted   Pregnancy with history of cesarean section, antepartum 03/30/2021   Indication for care in labor or delivery 03/25/2021   Headache 03/24/2021   Abdominal complaints 03/24/2021   Labor and delivery indication for care or intervention 03/18/2021   Abdominal pain in pregnancy, third trimester 03/01/2021   Pregnancy 01/31/2021   Dizziness 12/29/2020   Maternal obesity, antepartum 08/17/2020   ASCUS with positive high risk HPV cervical 08/17/2020   Supervision of high risk pregnancy, antepartum 08/17/2020   History of 3 cesarean sections 08/17/2020   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   CVA (cerebral vascular accident) (HCC) 10/09/2019   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    Allergies:  Allergies  Allergen Reactions   Oxycodone-Acetaminophen Hives and Itching   Nsaids     Gastric Bypass    Fluticasone Swelling    Flonase  Mouth swelling   Medications:  Current Outpatient Medications:    ondansetron (ZOFRAN-ODT) 4 MG disintegrating tablet, Take 1 tablet (4 mg total) by mouth every 6 (six) hours as needed for nausea., Disp: 20 tablet, Rfl: 0   acetaminophen (TYLENOL) 500 MG tablet,  Take 2 tablets (1,000 mg total) by mouth every 6 (six) hours as needed for fever or headache., Disp: 100 tablet, Rfl: 0   cholecalciferol (VITAMIN D3) 25 MCG (1000 UNIT) tablet, Take 2,000 Units by mouth daily., Disp: , Rfl:    Prenatal Vit-Fe Fumarate-FA (PRENATAL MULTIVITAMIN) TABS tablet, Take 2 tablets by mouth daily at 12 noon. 2 Gummies a day, Disp: , Rfl:   Observations/Objective: Patient is well-developed, well-nourished in no acute distress.  Resting comfortably  at home.  Head is normocephalic, atraumatic.  No labored breathing.  Speech is clear and coherent with logical content.  Patient is alert and oriented at baseline.    Assessment and Plan: 1. Epigastric pain  Meds ordered this encounter  Medications   ondansetron (ZOFRAN-ODT) 4 MG disintegrating tablet    Sig: Take 1 tablet (4 mg total) by mouth every 8 (eight) hours as needed for nausea or vomiting.    Dispense:  10 tablet    Refill:  0    Order Specific Question:   Supervising Provider    Answer:   Merrilee Jansky [id number]   Concern for pancreatitis.  Hx of the same. - Recommend in-person visit at ED, but patient declined.  Advised to go if symptoms worsen. - Zofran for nausea  Follow Up Instructions: I discussed the assessment and treatment plan with the patient. The patient was provided an opportunity to ask questions and all were answered. The patient agreed with the plan and demonstrated an understanding of the instructions.  A copy of instructions were sent to the patient via MyChart unless otherwise noted below.     The patient was advised to call back or seek an in-person evaluation if the symptoms worsen or if the condition fails to improve as anticipated.  Time:  I spent 10 minutes with the patient via telehealth technology discussing the above problems/concerns.    Roxy Horseman, PA-C

## 2022-03-21 NOTE — Patient Instructions (Signed)
  Reece Leader, thank you for joining Roxy Horseman, PA-C for today's virtual visit.  While this provider is not your primary care provider (PCP), if your PCP is located in our provider database this encounter information will be shared with them immediately following your visit.   A [facility] MyChart account gives you access to today's visit and all your visits, tests, and labs performed at [facility] " click here if you don't have a [facility] MyChart account or go to [email or web address]  Consent: (Patient) Sandy [name] provided verbal consent for this virtual visit at the beginning of the encounter.  Current Medications:  Current Outpatient Medications:    ondansetron (ZOFRAN-ODT) 4 MG disintegrating tablet, Take 1 tablet (4 mg total) by mouth every 8 (eight) hours as needed for nausea or vomiting., Disp: 10 tablet, Rfl: 0   acetaminophen (TYLENOL) 500 MG tablet, Take 2 tablets (1,000 mg total) by mouth every 6 (six) hours as needed for fever or headache., Disp: 100 tablet, Rfl: 0   cholecalciferol (VITAMIN D3) 25 MCG (1000 UNIT) tablet, Take 2,000 Units by mouth daily., Disp: , Rfl:    Prenatal Vit-Fe Fumarate-FA (PRENATAL MULTIVITAMIN) TABS tablet, Take 2 tablets by mouth daily at 12 noon. 2 Gummies a day, Disp: , Rfl:    Medications ordered in this encounter:  Meds ordered this encounter  Medications   ondansetron (ZOFRAN-ODT) 4 MG disintegrating tablet    Sig: Take 1 tablet (4 mg total) by mouth every 8 (eight) hours as needed for nausea or vomiting.    Dispense:  10 tablet    Refill:  0    Order Specific Question:   Supervising Provider    Answer:   Merrilee Jansky [id number]     *If you need refills on other medications prior to your next appointment, please contact your pharmacy*  Follow-Up: Call back or seek an in-person evaluation if the symptoms worsen or if the condition fails to improve as anticipated.  [facility] [phone]  Other Instructions    If you have been instructed to have an in-person evaluation today at a local Urgent Care facility, please use the link below. It will take you to a list of all of our available [facility], including address, phone number and hours of operation. Please do not delay care.  [facility]  If you or a family member do not have a primary care provider, use the link below to schedule a visit and establish care. When you choose a [facility] primary care physician or advanced practice provider, you gain a long-term partner in health. Find a Primary Care Provider  Learn more about [facility] in-office and virtual care options:  - Get Care Now

## 2022-06-30 ENCOUNTER — Emergency Department
Admission: EM | Admit: 2022-06-30 | Discharge: 2022-06-30 | Disposition: A | Discharge status: Home / Self Care | Payer: No Typology Code available for payment source | Attending: Emergency Medicine | Admitting: Emergency Medicine

## 2022-06-30 ENCOUNTER — Emergency Department: Payer: No Typology Code available for payment source

## 2022-06-30 ENCOUNTER — Other Ambulatory Visit: Payer: Self-pay

## 2022-06-30 DIAGNOSIS — M545 Low back pain, unspecified: Secondary | ICD-10-CM | POA: Diagnosis not present

## 2022-06-30 LAB — POC URINE PREG, ED: Preg Test, Ur: NEGATIVE

## 2022-06-30 MED ORDER — METHOCARBAMOL 500 MG PO TABS: 500.0000 mg | ORAL_TABLET | Freq: Three times a day (TID) | ORAL | 0 refills | Status: AC | PRN
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
## 2022-06-30 NOTE — Discharge Instructions (Addendum)
You can take Robaxin up to three times daily for muscle spasm.
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
## 2022-08-08 NOTE — Telephone Encounter (Signed)
Error

## 2022-09-02 ENCOUNTER — Ambulatory Visit
Admission: EM | Admit: 2022-09-02 | Discharge: 2022-09-02 | Disposition: A | Discharge status: Home / Self Care | Payer: Medicaid Other | Attending: Urgent Care | Admitting: Urgent Care

## 2022-09-02 DIAGNOSIS — K1230 Oral mucositis (ulcerative), unspecified: Secondary | ICD-10-CM

## 2022-09-02 MED ORDER — AMOXICILLIN-POT CLAVULANATE 875-125 MG PO TABS: 1.0000 | ORAL_TABLET | Freq: Two times a day (BID) | ORAL | 0 refills | Status: DC

## 2022-09-02 MED ORDER — TRAMADOL HCL 50 MG PO TABS: 50.0000 mg | ORAL_TABLET | Freq: Four times a day (QID) | ORAL | 0 refills | Status: AC | PRN

## 2022-09-02 NOTE — Discharge Instructions (Addendum)
I have treated you today with an antibiotic for a presumed bacterial infection of your gumline.  Please follow-up with evaluation by a dentist or periodontist.

## 2022-09-02 NOTE — ED Triage Notes (Addendum)
Pt c/o fevers and headaches for 4 days. The patient states she has been having tenderness to the right upper gum line.  Home interventions: tylenol, oral gel  The patient states she had gastric surgery in the past so she is not able to take ibuprofen.

## 2022-09-02 NOTE — ED Provider Notes (Signed)
Sandy [name]    CSN: [id number] Arrival date & time: 09/02/22  0908      History   Chief Complaint Chief Complaint  Patient presents with   Fever   Headache    HPI Sandy [name] is a 32 y.o. female.    Fever Associated symptoms: headaches   Headache Associated symptoms: fever     Patient presents to urgent care with concern for fever and headache x 4 days.  She endorses tenderness to her right upper gumline and states she sees a "pus-pocket".  She has been using Tylenol and an oral gel analgesic which has been ineffective at managing her pain.  She endorses sharp stabbing/throbbing pain worse with heat.  Endorses history of gastric surgery and unable to tolerate NSAIDs.  PMH includes GERD, gastric bypass.  Past Medical History:  Diagnosis Date   Bicornate uterus    Complication of anesthesia    itching after C-Sections   Family history of adverse reaction to anesthesia    uncle has a hard time waking up   Genital HSV    GERD (gastroesophageal reflux disease)    Migraine    Obesity affecting pregnancy    BMI>40    Patient Active Problem List   Diagnosis Date Noted   Pregnancy with history of cesarean section, antepartum 03/30/2021   Indication for care in labor or delivery 03/25/2021   Headache 03/24/2021   Abdominal complaints 03/24/2021   Labor and delivery indication for care or intervention 03/18/2021   Abdominal pain in pregnancy, third trimester 03/01/2021   Pregnancy 01/31/2021   Dizziness 12/29/2020   Maternal obesity, antepartum 08/17/2020   ASCUS with positive high risk HPV cervical 08/17/2020   Supervision of high risk pregnancy, antepartum 08/17/2020   History of 3 cesarean sections 08/17/2020   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   CVA (cerebral vascular accident) (HCC) 10/09/2019   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    Past  Surgical History:  Procedure Laterality Date   CESAREAN SECTION  2012   for HSV outbreak   CESAREAN SECTION N/A 11/29/2015   Procedure: CESAREAN SECTION Baby Girl 7lb.Sandy [place].;  Surgeon: Vena Austria, MD;  Location: [facility];  Service: Obstetrics;  Laterality: N/A;   CESAREAN SECTION N/A 12/18/2016   Procedure: CESAREAN SECTION;  Surgeon: Conard Novak, MD;  Location: [facility];  Service: Obstetrics;  Laterality: N/A;   CESAREAN SECTION N/A 03/30/2021   Procedure: CESAREAN SECTION;  Surgeon: Vena Austria, MD;  Location: [facility];  Service: Obstetrics;  Laterality: N/A;   ears Bilateral    tubes   ESOPHAGOGASTRODUODENOSCOPY (EGD) WITH PROPOFOL N/A 05/04/2016   Procedure: ESOPHAGOGASTRODUODENOSCOPY (EGD) WITH PROPOFOL;  Surgeon: Midge Minium, MD;  Location: [facility];  Service: Endoscopy;  Laterality: N/A;   HERNIA REPAIR  06/18/2019   roux en y  05/2020   STENT PLACEMENT ILIAC ([facility] HX) Right    leg   TONSILLECTOMY      OB History     Gravida  5   Para  4   Term  4   Preterm      AB      Living  4      SAB      IAB      Ectopic      Multiple  0   Live Births  4  Home Medications    Prior to Admission medications   Medication Sig Start Date End Date Taking? Authorizing Provider  acetaminophen (TYLENOL) 500 MG tablet Take 2 tablets (1,000 mg total) by mouth every 6 (six) hours as needed for fever or headache. 04/01/21   Schuman, Jaquelyn Bitter, MD  cholecalciferol (VITAMIN D3) 25 MCG (1000 UNIT) tablet Take 2,000 Units by mouth daily.    [provider]  ondansetron (ZOFRAN-ODT) 4 MG disintegrating tablet Take 1 tablet (4 mg total) by mouth every 8 (eight) hours as needed for nausea or vomiting. 03/21/22   Roxy Horseman, PA-C  Prenatal Vit-Fe Fumarate-FA (PRENATAL MULTIVITAMIN) TABS tablet Take 2 tablets by mouth daily at 12 noon. 2 Gummies a day    [provider]    Family History Family History  Problem  Relation Age of Onset   Diabetes Maternal Aunt    Hyperlipidemia Maternal Aunt     Social History Social History   Tobacco Use   Smoking status: Former    Types: Cigarettes    Quit date: 04/09/2013    Years since quitting: 9.4   Smokeless tobacco: Never  Vaping Use   Vaping Use: Never used  Substance Use Topics   Alcohol use: Not Currently    Comment: 2x/month, none during pregnacy   Drug use: No     Allergies   Oxycodone-acetaminophen, Nsaids, and Fluticasone   Review of Systems Review of Systems  Constitutional:  Positive for fever.  Neurological:  Positive for headaches.     Physical Exam Triage Vital Signs ED Triage Vitals  Enc Vitals Group     BP 09/02/22 0931 121/80     Pulse Rate 09/02/22 0931 70     Resp 09/02/22 0931 18     Temp 09/02/22 0931 99 F (37.2 C)     Temp Source 09/02/22 0931 Oral     SpO2 09/02/22 0931 97 %     Weight --      Height --      Head Circumference --      Peak Flow --      Pain Score 09/02/22 0935 10     Pain Loc --      Pain Edu? --      Excl. in [place]? --    No data found.  Updated Vital Signs BP 121/80 (BP Location: Left Arm)   Pulse 70   Temp 99 F (37.2 C) (Oral)   Resp 18   SpO2 97%   Breastfeeding No   Visual Acuity Right Eye Distance:   Left Eye Distance:   Bilateral Distance:    Right Eye Near:   Left Eye Near:    Bilateral Near:     Physical Exam Vitals reviewed.  Constitutional:      Appearance: She is well-developed.  HENT:     Mouth/Throat:   Neurological:     Mental Status: She is alert and oriented to person, place, and time.  Psychiatric:        Mood and Affect: Mood normal.        Behavior: Behavior normal.      UC Treatments / Results  Labs (all labs ordered are listed, but only abnormal results are displayed) Labs Reviewed - No data to display  EKG   Radiology No results found.  Procedures Procedures (including critical care time)  Medications Ordered in  UC Medications - No data to display  Initial Impression / Assessment and Plan / UC Course  I  have reviewed the triage vital signs and the nursing notes.  Pertinent labs & imaging results that were available during my care of the patient were reviewed by me and considered in my medical decision making (see chart for details).   Patient has a 2 mm ulceration vs vesicle at the L upper gumline near the canine. Exquisitely tender to palpation/probe. Presumed abscess of bacterial etiology given her description of symptoms, however it resembles a herpetic lesion. Given her intolerance of NSAIDs, will prescribe a limited amount of tramadol which her history indicates tolerance for, along with antibiotic therapy.  Counseled patient on potential for adverse effects with medications prescribed/recommended today, ER and return-to-clinic precautions discussed, patient verbalized understanding and agreement with care plan.  Final Clinical Impressions(s) / UC Diagnoses   Final diagnoses:  None   Discharge Instructions   None    ED Prescriptions   None    PDMP not reviewed this encounter.   Charma Igo, [place] 09/02/22 1023

## 2022-11-06 ENCOUNTER — Encounter: Payer: Self-pay | Admitting: Licensed Practical Nurse

## 2022-11-06 ENCOUNTER — Other Ambulatory Visit: Payer: Self-pay | Admitting: Licensed Practical Nurse

## 2022-11-06 ENCOUNTER — Ambulatory Visit (INDEPENDENT_AMBULATORY_CARE_PROVIDER_SITE_OTHER): Payer: Medicaid Other | Admitting: Licensed Practical Nurse

## 2022-11-06 VITALS — BP 119/73 | HR 61 | Wt 164.9 lb

## 2022-11-06 DIAGNOSIS — R102 Pelvic and perineal pain unspecified side: Secondary | ICD-10-CM

## 2022-11-06 DIAGNOSIS — R112 Nausea with vomiting, unspecified: Secondary | ICD-10-CM

## 2022-11-06 DIAGNOSIS — R42 Dizziness and giddiness: Secondary | ICD-10-CM | POA: Diagnosis not present

## 2022-11-06 DIAGNOSIS — N939 Abnormal uterine and vaginal bleeding, unspecified: Secondary | ICD-10-CM | POA: Diagnosis not present

## 2022-11-06 MED ORDER — ONDANSETRON 4 MG PO TBDP
4.0000 mg | ORAL_TABLET | Freq: Three times a day (TID) | ORAL | 1 refills | Status: DC | PRN
Start: 2022-11-06 — End: 2022-12-05

## 2022-11-06 NOTE — Progress Notes (Signed)
Pt requesting refill on Zofran. Ordered, rec seeing GI Jannifer Hick  [facility]  11/06/22  3:03 PM

## 2022-11-06 NOTE — Progress Notes (Unsigned)
Gynecology Pelvic Pain Evaluation   Chief Complaint:  -heavy and prolonged bleeding since June -pain/burning  near c-section scar -Pelvic pain   History of Present Illness:   Patient is a 32 y.o. [id number] who had a fifth repeat c/s at [facility] 9 months ago. She was given Depo prior to discharge Her last dose of Depo was in December or January. Initially she had light cycles that lasted 304 days. But she has been bleeding on and off since June. The cycle started June 1 and lasted 2 weeks, she passed some clots then. At the end of June she bled for another 3-4 days. She bled for most of July, the bleeding stopped on the 15th and picked up again on the 19th with clots and needing to change her products every hour. Yesterday she had significant pain then passed a large clot, since then the bleeding is light. She has had dizzy spells, in early July she fell while the bathroom, she may have felt dizzy and then tripped on something-she was taken to the ED and was evaluated and sent home.   Normally her cycles are heavy after giving birth. As a teen she had heavy cycles but that improved with weight loss. As an adult her cycles are regular, they begin on the 7th of every month.  C-section Scar: Neola reports she had normal PP follow up, at some point her scar opened up, she was treated with antibiotics. Now she has a burning sensation at her scar. She tends to have more pain on the left side of the scar.   Back Pain; she has had significant back pain which she feels is related to the spinal. She was told it was difficult to insert the spinal d/t scoliosis, she was not aware she had scoliosis. She has tried Toradol and muscle relaxer she was supposed to be referred to a specialist but that has not happened yet. She "lives on Tylenol"  Pelvic Pain: she has felt fullness in her pelvis as if she were still pregnant. She has not been able to have sex because it is too painful when her partner tries to enter. She  is not able to wear tampons d/t the discomfort. This is new for her. She previously was able to use tampons and has not experienced pain with sex.    She has been seen at [facility] at a walk in clinic for most the above complaints, they were able to order a pelvic US but told they would not be able to do any follow up with her.  Laquonda states "I just want my life back". She does not want to use Depo as she is concerned for weight gain. She is not breastfeeding, she does not use tobacco. 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   Nubia had gastric bypass surgery years ago, she reports she is nauseous daily and relies on Zofran, she has not seen GI in a while.     PMHx: She  has a past medical history of Bicornate uterus, Complication of anesthesia, Family history of adverse reaction to anesthesia, Genital HSV, GERD (gastroesophageal reflux disease), Migraine, and Obesity affecting pregnancy. Also,  has a past surgical history that includes Tonsillectomy; ears (Bilateral); Esophagogastroduodenoscopy (egd) with propofol (N/A, 05/04/2016); Cesarean section (2012); Cesarean section (N/A, 11/29/2015); Cesarean section (N/A, 12/18/2016); STENT PLACEMENT ILIAC ([facility] HX) (Right); Hernia repair (06/18/2019); roux en y (05/2020); and Cesarean section (N/A, 03/30/2021)., family history includes Diabetes in her maternal aunt; Hyperlipidemia  in her maternal aunt.,  reports that she quit smoking about 9 years ago. Her smoking use included cigarettes. She has never used smokeless tobacco. She reports that she does not currently use alcohol. She reports that she does not use drugs.  She has a current medication list which includes the following prescription(s): acetaminophen, amoxicillin-clavulanate, cholecalciferol, ondansetron, and prenatal multivitamin. Also, is allergic to oxycodone-acetaminophen, nsaids, and fluticasone.  ROSsee HPI   Objective: BP 119/73   Pulse 61   Wt 164 lb 14.4 oz (74.8 kg)   BMI 27.02  kg/m  Physical Exam Constitutional:      Appearance: Normal appearance.  Genitourinary:     Vulva normal.     Genitourinary Comments: Cervix pink, no lesions, small amount of old blood like discharge present   Abdominal:     Comments: Vertical c-section scar well healed, tended at the lower portion midline.   Neurological:     Mental Status: She is alert.     Assessment: 32 y.o. [id number] with    1. Light headed  - CBC  2. Abnormal uterine bleeding  -MD not available for consult, will send pt home and send mychart message with recommendations.   -reviewed Pt 's most recent US  and symptoms with Dr Valentino Saxon. If bleeding returns, pt may try 1) estradiol x 2 weeks (it is possible that she is still have effects from the Depo),  2) aygestin for three months or 3) Most recent US was normal and did not mention a bicornuate uterus, she could try an IUD. Pt sent my chart message with options  - CBC  -C-section scar: well healed, reviewed normal healing. Rec castor oil packs    -Pelvic pain: rec pelvic pain clinic at [facility]   Problem List Items Addressed This Visit   None Visit Diagnoses     Nausea and vomiting, unspecified vomiting type    -  Primary   Relevant Orders   Ambulatory referral to Gastroenterology   Light headed       Relevant Orders   CBC (Completed)   Abnormal uterine bleeding       Relevant Orders   CBC (Completed)   Pelvic pain           Carie Caddy, CNM  [facility]  11/08/22  11:21 AM

## 2022-11-08 ENCOUNTER — Encounter: Payer: Self-pay | Admitting: Licensed Practical Nurse

## 2022-11-08 DIAGNOSIS — R102 Pelvic and perineal pain unspecified side: Secondary | ICD-10-CM | POA: Insufficient documentation

## 2022-11-09 ENCOUNTER — Emergency Department
Admission: EM | Admit: 2022-11-09 | Discharge: 2022-11-09 | Disposition: A | Discharge status: Home / Self Care | Payer: Medicaid Other | Attending: Emergency Medicine | Admitting: Emergency Medicine

## 2022-11-09 ENCOUNTER — Emergency Department: Payer: Medicaid Other

## 2022-11-09 ENCOUNTER — Other Ambulatory Visit: Payer: Self-pay

## 2022-11-09 DIAGNOSIS — R791 Abnormal coagulation profile: Secondary | ICD-10-CM | POA: Diagnosis not present

## 2022-11-09 DIAGNOSIS — R531 Weakness: Secondary | ICD-10-CM | POA: Diagnosis present

## 2022-11-09 DIAGNOSIS — R519 Headache, unspecified: Secondary | ICD-10-CM | POA: Insufficient documentation

## 2022-11-09 DIAGNOSIS — G43009 Migraine without aura, not intractable, without status migrainosus: Secondary | ICD-10-CM

## 2022-11-09 LAB — COMPREHENSIVE METABOLIC PANEL
ALT: 16 U/L (ref 0–44)
AST: 18 U/L (ref 15–41)
Albumin: 4.4 g/dL (ref 3.5–5.0)
Alkaline Phosphatase: 99 U/L (ref 38–126)
Anion gap: 10 (ref 5–15)
BUN: 13 mg/dL (ref 6–20)
CO2: 24 mmol/L (ref 22–32)
Calcium: 8.7 mg/dL — ABNORMAL LOW (ref 8.9–10.3)
Chloride: 103 mmol/L (ref 98–111)
Creatinine, Ser: 0.64 mg/dL (ref 0.44–1.00)
GFR, Estimated: >60 mL/min (ref >60)
Glucose, Bld: 85 mg/dL (ref 70–99)
Potassium: 3.5 mmol/L (ref 3.5–5.1)
Sodium: 137 mmol/L (ref 135–145)
Total Bilirubin: 0.7 mg/dL (ref 0.3–1.2)
Total Protein: 7 g/dL (ref 6.5–8.1)

## 2022-11-09 LAB — DIFFERENTIAL
Abs Immature Granulocytes: 0.03 10*3/uL (ref 0.00–0.07)
Basophils Absolute: 0.1 10*3/uL (ref 0.0–0.1)
Basophils Relative: 1 %
Eosinophils Absolute: 0.3 10*3/uL (ref 0.0–0.5)
Eosinophils Relative: 5 %
Immature Granulocytes: 0 %
Lymphocytes Relative: 22 %
Lymphs Abs: 1.5 10*3/uL (ref 0.7–4.0)
Monocytes Absolute: 0.4 10*3/uL (ref 0.1–1.0)
Monocytes Relative: 6 %
Neutro Abs: 4.4 10*3/uL (ref 1.7–7.7)
Neutrophils Relative %: 66 %

## 2022-11-09 LAB — CBC
HCT: 40.5 % (ref 36.0–46.0)
Hemoglobin: 14.6 g/dL (ref 12.0–15.0)
MCH: 30.6 pg (ref 26.0–34.0)
MCHC: 36 g/dL (ref 30.0–36.0)
MCV: 84.9 fL (ref 80.0–100.0)
Platelets: 190 10*3/uL (ref 150–400)
RBC: 4.77 MIL/uL (ref 3.87–5.11)
RDW: 12.1 % (ref 11.5–15.5)
WBC: 6.8 10*3/uL (ref 4.0–10.5)
nRBC: 0 % (ref 0.0–0.2)

## 2022-11-09 LAB — APTT: aPTT: 28 seconds (ref 24–36)

## 2022-11-09 LAB — PROTIME-INR
INR: 1.2 (ref 0.8–1.2)
Prothrombin Time: 15.4 seconds — ABNORMAL HIGH (ref 11.4–15.2)

## 2022-11-09 LAB — ETHANOL: Alcohol, Ethyl (B): <10 mg/dL (ref <10)

## 2022-11-09 LAB — CBG MONITORING, ED: Glucose-Capillary: 93 mg/dL (ref 70–99)

## 2022-11-09 MED ORDER — PROCHLORPERAZINE EDISYLATE 10 MG/2ML IJ SOLN
10.0000 mg | Freq: Once | INTRAMUSCULAR | Status: AC
  Administered 2022-11-09: 10 mg via INTRAVENOUS
  Filled 2022-11-09: qty 2

## 2022-11-09 MED ORDER — SODIUM CHLORIDE 0.9 % IV BOLUS
1000.0000 mL | Freq: Once | INTRAVENOUS | Status: AC
  Administered 2022-11-09: 1000 mL via INTRAVENOUS

## 2022-11-09 MED ORDER — IOHEXOL 350 MG/ML SOLN
75.0000 mL | Freq: Once | INTRAVENOUS | Status: AC | PRN
  Administered 2022-11-09: 75 mL via INTRAVENOUS

## 2022-11-09 NOTE — ED Notes (Signed)
Pt talking to Neurologist on the stroke cart

## 2022-11-09 NOTE — ED Notes (Signed)
Call bell answered by this EDT. Pt expressed "feeling better" and wanting to go home to her "18 month old that needs to eat." Pt denied needing anything further from this EDT. MD notified.

## 2022-11-09 NOTE — Consult Note (Signed)
NEUROLOGY TELECONSULTATION NOTE   Date of service: November 09, 2022 Patient Name: Sandy [name] MRN:  [id number] DOB:  July 15, 1990 Reason for consult: L sided weakness  Requesting Provider: Dr. Phineas Semen Consult Participants: myself, patient, bedside RN, telestroke RN Location of the provider: [facility], [place] Location of the patient: [facility]  This consult was provided via telemedicine with 2-way video and audio communication. The patient/family was informed that care would be provided in this way and agreed to receive care in this manner.   _ _ _   _ __   _ __ _ _  __ __   _ __   __ _  History of Present Illness   This is a 32 yo woman with hx migraine, Roux-en-y bypass, and abnormal vaginal bleeding who presents with L sided weakness. LKW 1600 when she began to feel diffusely weak like her blood sugar was low or she was dehydrated. She ate something and felt better but then developed a headache with nausea and L sided weakness and sensory deficit at 1700. BG 93 on arrival.  She has hx migraines but has not had neurologic sx with them in the past. NIHSS = 7 with L sided weakness. CT head no acute process, personal review. TNK not administered 2/2 sx favored to be complex migraine as well as contraindication of persistent abnormal uterine bleeding (pt reports she has had significant bleeding from her vagina for over 30 consecutive days, until just 2 days ago, soaking through up to 2 pads/hr on most days, currently awaiting specialist appt that her OB referred her to). CTA showed no hemodynamically significant stenosis (personal review and discussion with radiology).   ROS   Per HPI; all other systems reviewed and are negative  Past History   The following was personally reviewed:  Past Medical History:  Diagnosis Date   Bicornate uterus    Complication of anesthesia    itching after C-Sections   Family history of adverse reaction to anesthesia    uncle has a hard time waking up    Genital HSV    GERD (gastroesophageal reflux disease)    Migraine    Obesity affecting pregnancy    BMI>40   Past Surgical History:  Procedure Laterality Date   CESAREAN SECTION  2012   for HSV outbreak   CESAREAN SECTION N/A 11/29/2015   Procedure: CESAREAN SECTION Baby Girl 7lb.[name];  Surgeon: Vena Austria, MD;  Location: [facility];  Service: Obstetrics;  Laterality: N/A;   CESAREAN SECTION N/A 12/18/2016   Procedure: CESAREAN SECTION;  Surgeon: Conard Novak, MD;  Location: [facility];  Service: Obstetrics;  Laterality: N/A;   CESAREAN SECTION N/A 03/30/2021   Procedure: CESAREAN SECTION;  Surgeon: Vena Austria, MD;  Location: [facility];  Service: Obstetrics;  Laterality: N/A;   ears Bilateral    tubes   ESOPHAGOGASTRODUODENOSCOPY (EGD) WITH PROPOFOL N/A 05/04/2016   Procedure: ESOPHAGOGASTRODUODENOSCOPY (EGD) WITH PROPOFOL;  Surgeon: Midge Minium, MD;  Location: [facility];  Service: Endoscopy;  Laterality: N/A;   HERNIA REPAIR  06/18/2019   roux en y  05/2020   STENT PLACEMENT ILIAC [facility] HX) Right    leg   TONSILLECTOMY     Family History  Problem Relation Age of Onset   Diabetes Maternal Aunt    Hyperlipidemia Maternal Aunt    Social History   Socioeconomic History   Marital status: Widowed    Spouse name: Not on file   Number of children: Not on  file   Years of education: Not on file   Highest education level: Not on file  Occupational History   Not on file  Tobacco Use   Smoking status: Former    Current packs/day: 0.00    Types: Cigarettes    Quit date: 04/09/2013    Years since quitting: 9.5   Smokeless tobacco: Never  Vaping Use   Vaping status: Never Used  Substance and Sexual Activity   Alcohol use: Not Currently    Comment: 2x/month, none during pregnacy   Drug use: No   Sexual activity: Yes    Birth control/protection: Pill  Other Topics Concern   Not on file  Social History Narrative   Not on file   Social Determinants of  Health   Financial Resource Strain: Low Risk  (06/06/2020)   Received from [facility], [facility]   Overall Financial Resource Strain (CARDIA)    Difficulty of Paying Living Expenses: Not hard at all  Food Insecurity: No Food Insecurity (05/12/2020)   Received from [facility], [facility]   Hunger Vital Sign    Worried About Running Out of Food in the Last Year: Never true    Ran Out of Food in the Last Year: Never true  Transportation Needs: No Transportation Needs (01/29/2022)   Received from [facility], [facility]   [facility] - Transportation    In the past 12 months, has lack of transportation kept you from medical appointments or from getting medications?: No    Lack of Transportation (Non-Medical): No  Physical Activity: Not on file  Stress: No Stress Concern Present (06/06/2020)   Received from [facility], [facility] - Occupational Stress Questionnaire    Feeling of Stress : Not at all  Social Connections: Unknown (08/08/2021)   Received from [facility], [facility]   Social Network    Social Network: Not on file   Allergies  Allergen Reactions   Oxycodone-Acetaminophen Hives and Itching   Nsaids     Gastric Bypass    Fluticasone Swelling    Flonase  Mouth swelling    Medications   (Not in a hospital admission)     Current Facility-Administered Medications:    prochlorperazine (COMPAZINE) injection 10 mg, 10 mg, Intravenous, Once, Phineas Semen, MD   sodium chloride 0.9 % bolus 1,000 mL, 1,000 mL, Intravenous, Once, Phineas Semen, MD  Current Outpatient Medications:    acetaminophen (TYLENOL) 500 MG tablet, Take 2 tablets (1,000 mg total) by mouth every 6 (six) hours as needed for fever or headache., Disp: 100 tablet, Rfl: 0   amoxicillin-clavulanate (AUGMENTIN) 875-125 MG tablet, Take 1 tablet by mouth every 12 (twelve) hours., Disp: 14 tablet, Rfl: 0    cholecalciferol (VITAMIN D3) 25 MCG (1000 UNIT) tablet, Take 2,000 Units by mouth daily., Disp: , Rfl:    ondansetron (ZOFRAN-ODT) 4 MG disintegrating tablet, Take 1 tablet (4 mg total) by mouth every 8 (eight) hours as needed for nausea or vomiting., Disp: 10 tablet, Rfl: 1   Prenatal Vit-Fe Fumarate-FA (PRENATAL MULTIVITAMIN) TABS tablet, Take 2 tablets by mouth daily at 12 noon. 2 Gummies a day, Disp: , Rfl:   Vitals   Vitals:   11/09/22 1919 11/09/22 1930 11/09/22 1952  BP: 137/82 116/71 104/80  Pulse: 93    Resp: 18 15 17   Temp: 98.2 F (36.8 C)    TempSrc: Oral    SpO2: 100%    Height:  5\' 5"  (1.651 m)       Body mass index is 27.44 kg/m.  Physical Exam   Exam performed over telemedicine with 2-way video and audio communication and with assistance of bedside RN  Physical Exam Gen: A&O x4, NAD Resp: normal WOB CV: extremities appear well-perfused  Neuro: *MS: A&O x4. Follows multi-step commands.  *Speech: mild dysarthria, able to name 4/6 objects *CN: PERRL 3mm, EOMI, VFF by confrontation, L sided sensory deficit, L lower facial droop, hearing intact to voice *Motor:   Normal bulk.  No tremor, rigidity or bradykinesia. Drift but not to bed LUE, drift to bed LLE, full strength RUE and RLE *Sensory: L sided sensory deficit *Coordination:  Finger-to-nose, heel-to-shin, rapid alternating motions were intact. *Reflexes:  UTA 2/2 tele-exam *Gait: deferred  NIHSS  1a Level of Conscious.: 0 1b LOC Questions: 0 1c LOC Commands: 0 2 Best Gaze: 0 3 Visual: 0 4 Facial Palsy: 1 5a Motor Arm - left: 1 5b Motor Arm - Right: 0 6a Motor Leg - Left: 0 6b Motor Leg - Right: 2 7 Limb Ataxia: 0 8 Sensory: 1 9 Best Language: 1 10 Dysarthria: 1 11 Extinct. and Inatten.: 0  TOTAL: 7   Premorbid mRS = 0   Labs   CBC:  Recent Labs  Lab 11/06/22 1217 11/09/22 1919  WBC 4.5 6.8  NEUTROABS  --  4.4  HGB 14.0 14.6  HCT 40.5 40.5  MCV 88 84.9  PLT 164 190    Basic  Metabolic Panel:  Lab Results  Component Value Date   NA 137 11/09/2022   K 3.5 11/09/2022   CO2 24 11/09/2022   GLUCOSE 85 11/09/2022   BUN 13 11/09/2022   CREATININE 0.64 11/09/2022   CALCIUM 8.7 (L) 11/09/2022   GFRNONAA >60 11/09/2022   GFRAA >60 10/09/2019   Lipid Panel: No results found for: "LDLCALC" HgbA1c:  Lab Results  Component Value Date   HGBA1C <4.2 (L) 01/19/2021   Urine Drug Screen:     Component Value Date/Time   LABOPIA NONE DETECTED 09/13/2016 0924   COCAINSCRNUR NONE DETECTED 09/13/2016 0924   LABBENZ NONE DETECTED 09/13/2016 0924   AMPHETMU NONE DETECTED 09/13/2016 0924   THCU NONE DETECTED 09/13/2016 0924   LABBARB POSITIVE (A) 09/13/2016 0924    Alcohol Level No results found for: "ETH"  CT Head without contrast: No acute process, personal review  CT angio Head and Neck with contrast: No hemodynamically-significant stenoses, personal review and discussion with radiology by phone  Impression   This is a 32 yo woman with hx migraine, Roux-en-y bypass, and abnormal vaginal bleeding who presents with L sided weakness, headache, and nausea favored based on history of migraine, age, and constellation of sx to be 2/2 atypical migraine. Given prominence of L sided weakness stroke code was called and Ct/CTA were unremarkable. TNK not administered 2/2 sx favored to be complex migraine as well as contraindication of persistent abnormal uterine bleeding.  Recommendations   - Headache cocktail in ED and IV fluids - MRI brain wo contrast, if no acute infarct no indication for further stroke workup  D/w Dr. Derrill Kay ______________________________________________________________________   Thank you for the opportunity to take part in the care of this patient. If you have any further questions, please contact the neurology consultation attending.  Signed,  Bing Neighbors, MD [facility] [phone]  If 7pm- 7am, please page neurology on call  as listed in AMION.  **Any copied and pasted documentation in this note was  written by me in another application not billed for and pasted by me into this document.

## 2022-11-09 NOTE — ED Provider Notes (Signed)
[facility] Provider Note    Event Date/Time   First MD Initiated Contact with Patient 11/09/22 1924     (approximate)   History   Weakness  HPI  Sandy [name] is a 32 y.o. female who presents to the urgency department today with primary concern for left sided weakness.  This is associated with a left-sided headache.  Symptoms started around 4:00 this afternoon.  The patient first started noticing an odd feeling in her body.  She almost felt like her blood sugar was dropping.  She tried eating something but that did not help.  She then started developing a left sided headache.  She then noticed some pins-and-needles feeling and feeling of heaviness in her left arm and leg.  Went to urgent care who sent her to the emergency department.  She denies similar symptoms in the past.  Has history of migraines with pregnancy but that had resolved for her.    Physical Exam   Triage Vital Signs: ED Triage Vitals [11/09/22 1919]  Encounter Vitals Group     BP 137/82     Systolic BP Percentile      Diastolic BP Percentile      Pulse Rate 93     Resp 18     Temp 98.2 F (36.8 C)     Temp Source Oral     SpO2 100 %     Weight      Height 5\' 5"  (1.651 m)     Head Circumference      Peak Flow      Pain Score      Pain Loc      Pain Education      Exclude from Growth Chart     Most recent vital signs: Vitals:   11/09/22 1919  BP: 137/82  Pulse: 93  Resp: 18  Temp: 98.2 F (36.8 C)  SpO2: 100%   General: Awake, alert, oriented. CV:  Good peripheral perfusion. Regular rate and rhythm. Resp:  Normal effort. Lungs clear. Abd:  No distention.  Other:  Face symmetric. PERRL. EOMI. Strength 5/5 in right extremities. 4/5 in left extremities.    ED Results / Procedures / Treatments   Labs (all labs ordered are listed, but only abnormal results are displayed) Labs Reviewed  COMPREHENSIVE METABOLIC PANEL - Abnormal; Notable for the following components:       Result Value   Calcium 8.7 (*)    All other components within normal limits  CBC  DIFFERENTIAL  URINE DRUG SCREEN, QUALITATIVE ([facility] ONLY)  URINALYSIS, ROUTINE W REFLEX MICROSCOPIC  PROTIME-INR  APTT  ETHANOL  CBG MONITORING, ED  POC URINE PREG, ED     EKG  I, Phineas Semen, attending physician, personally viewed and interpreted this EKG  EKG Time: 1912 Rate: 87 Rhythm: sinus rhythm Axis: normal Intervals: qtc 443 QRS: narrow ST changes: no st elevation Impression: normal ekg   RADIOLOGY I independently interpreted and visualized the CT head. My interpretation: No acute bleed Radiology interpretation:  IMPRESSION:  1. Normal head CT.  No acute intracranial abnormality.  2. ASPECTS is 10.      PROCEDURES:  Critical Care performed: Yes  CRITICAL CARE Performed by: Phineas Semen   Total critical care time: 30 minutes  Critical care time was exclusive of separately billable procedures and treating other patients.  Critical care was necessary to treat or prevent imminent or life-threatening deterioration.  Critical care was time spent personally by me on  the following activities: development of treatment plan with patient and/or surrogate as well as nursing, discussions with consultants, evaluation of patient's response to treatment, examination of patient, obtaining history from patient or surrogate, ordering and performing treatments and interventions, ordering and review of laboratory studies, ordering and review of radiographic studies, pulse oximetry and re-evaluation of patient's condition.   Procedures    MEDICATIONS ORDERED IN ED: Medications  sodium chloride 0.9 % bolus 1,000 mL (has no administration in time range)     IMPRESSION / MDM / ASSESSMENT AND PLAN / ED COURSE  I reviewed the triage vital signs and the nursing notes.                              Differential diagnosis includes, but is not limited to, complex migraine,  CVA  Patient's presentation is most consistent with acute presentation with potential threat to life or bodily function.   The patient is on the cardiac monitor to evaluate for evidence of arrhythmia and/or significant heart rate changes.  Patient presented to the emergency department today with primary concern for left-sided weakness and also with headache.  On my exam patient was weak on her left side.  Given concern for possible CVA code stroke was called.  Dr. Selina Cooley with neurology was kind enough to evaluate the patient.  At this time she did not feel patient was candidate for TN K.  Did think it was likely complex migraine.  I discussed this with the patient.  Did give patient IV fluids and migraine medication.  Dr. Selina Cooley also recommended MRI however patient stated she did feel better after medication and stated she wanted to be discharged home.  She has a 57-month at home that she was concerned about.  This point I think that is reasonable given resolution of patient's symptoms.  Did discuss with patient importance of returning for worsening symptoms.    FINAL CLINICAL IMPRESSION(S) / ED DIAGNOSES   Final diagnoses:  Bad headache  Left-sided weakness     Note:  This document was prepared using Dragon voice recognition software and may include unintentional dictation errors.    Phineas Semen, MD 11/09/22 2239

## 2022-11-09 NOTE — ED Triage Notes (Addendum)
Pt c/o generalized weakness today around 4pm when they was at the park today. Pt sts she felt like her bgl was going "low". Pt sts she had hypoglycemia in the past. Pt sts she feels funny on the left side. Pt sts she feels like her face is tingling, weakness on the left side. Left grip is weaker than the right side. Pt also c/o left side headache. 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

## 2022-11-09 NOTE — ED Notes (Signed)
CBG 93 

## 2022-11-09 NOTE — Discharge Instructions (Signed)
Please seek medical attention for any high fevers, chest pain, shortness of breath, change in behavior, persistent vomiting, bloody stool or any other new or concerning symptoms.  

## 2022-11-09 NOTE — Progress Notes (Signed)
1931 Stroke cart activated and elert sent to [facility]. Pt assessed by EDP Derrill Kay, MD) prior to cart activation and pt currently in CT. 1933 Dr. Selina Cooley with [facility] paged. 1935 Dr. Selina Cooley connected via stroke cart in CT and assessing pt at this time.

## 2022-11-09 NOTE — ED Notes (Signed)
Activated Code Stroke w/[facility] 

## 2022-11-09 NOTE — ED Notes (Signed)
Pt is in ct

## 2022-11-27 DIAGNOSIS — E162 Hypoglycemia, unspecified: Secondary | ICD-10-CM | POA: Insufficient documentation

## 2022-12-05 ENCOUNTER — Telehealth: Payer: Medicaid Other | Admitting: Family Medicine

## 2022-12-05 DIAGNOSIS — R519 Headache, unspecified: Secondary | ICD-10-CM

## 2022-12-05 MED ORDER — PROMETHAZINE HCL 12.5 MG PO TABS
12.5000 mg | ORAL_TABLET | Freq: Three times a day (TID) | ORAL | 0 refills | Status: DC | PRN
Start: 2022-12-05 — End: 2023-05-22

## 2022-12-05 MED ORDER — RIZATRIPTAN BENZOATE 10 MG PO TABS: 10.0000 mg | ORAL_TABLET | ORAL | 0 refills | Status: DC | PRN

## 2022-12-05 NOTE — Progress Notes (Signed)
Virtual Visit Consent   Sandy [name], you are scheduled for a virtual visit with a [facility] provider today. Just as with appointments in the office, your consent must be obtained to participate. Your consent will be active for this visit and any virtual visit you may have with one of our providers in the next 365 days. If you have a MyChart account, a copy of this consent can be sent to you electronically.  As this is a virtual visit, video technology does not allow for your provider to perform a traditional examination. This may limit your provider's ability to fully assess your condition. If your provider identifies any concerns that need to be evaluated in person or the need to arrange testing (such as labs, EKG, etc.), we will make arrangements to do so. Although advances in technology are sophisticated, we cannot ensure that it will always work on either your end or our end. If the connection with a video visit is poor, the visit may have to be switched to a telephone visit. With either a video or telephone visit, we are not always able to ensure that we have a secure connection.  By engaging in this virtual visit, you consent to the provision of healthcare and authorize for your insurance to be billed (if applicable) for the services provided during this visit. Depending on your insurance coverage, you may receive a charge related to this service.  I need to obtain your verbal consent now. Are you willing to proceed with your visit today? Sandy [name] has provided verbal consent on 12/05/2022 for a virtual visit (video or telephone). Freddy Finner, NP  Date: 12/05/2022 3:17 PM  Virtual Visit via Video Note   I, Freddy Finner, connected with  Sandy [name]  ([id number], 12/12/1990) on 12/05/22 at  3:15 PM EDT by a video-enabled telemedicine application and verified that I am speaking with the correct person using two identifiers.  Location: Patient: Virtual Visit Location Patient:  Home Provider: Virtual Visit Location Provider: Home Office   I discussed the limitations of evaluation and management by telemedicine and the availability of in person appointments. The patient expressed understanding and agreed to proceed.    History of Present Illness: Sandy [name] is a 32 y.o. who identifies as a female who was assigned female at birth, and is being seen today for migraine   Onset was 3 days ago- stopped for a day and restarted over last 24 hours- light and sound sensation  Associated symptoms are nausea and inability can't eat well, pain in the central top of head Modifying factors are Tylenol, zofran, and benadryl and caffeine and limited for NSAIDS due to gastric bypass.  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   CVA (cerebral vascular accident) (HCC) 10/09/2019   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 2 Gummies a day, Disp: , Rfl:   Observations/Objective: Patient is well-developed, well-nourished in no acute distress.  Resting comfortably  at home.  Head is normocephalic, atraumatic.  No labored breathing.  Speech is clear and coherent with logical content.  Patient is alert and oriented at baseline.    Assessment and Plan:  1. Nonintractable headache, unspecified chronicity pattern, unspecified headache type  - rizatriptan (MAXALT) 10 MG tablet; Take 1 tablet (10 mg total) by mouth as needed for migraine. May repeat in 2 hours if needed  Dispense: 10 tablet; Refill: 0 - promethazine (PHENERGAN) 12.5 MG tablet; Take 1 tablet (12.5 mg total) by mouth every 8 (eight) hours as needed for nausea or vomiting.  Dispense: 10 tablet; Refill: 0  -info for migraine and medication reviewed and on AVS -pcp referral info provided -advised in person might be needed if this does not abort, could look at prednisone as well    Reviewed side effects, risks and benefits of medication.    Patient acknowledged agreement and understanding of the plan.   Past Medical, Surgical, Social History, Allergies, and Medications have been Reviewed.    Follow Up Instructions: I discussed the assessment and treatment plan with the patient. The patient was provided an opportunity to ask questions and all were answered. The patient agreed with the plan and demonstrated an understanding of the instructions.  A copy of instructions were sent to the patient via MyChart unless otherwise noted below.    The patient was advised to call back or seek an in-person evaluation if the symptoms worsen or if the condition fails to improve as anticipated.  Time:  I spent 10 minutes with the patient via  telehealth technology discussing the above problems/concerns.    Freddy Finner, NP

## 2022-12-05 NOTE — Patient Instructions (Addendum)
Reece Leader, thank you for joining Freddy Finner, NP for today's virtual visit.  While this provider is not your primary care provider (PCP), if your PCP is located in our provider database this encounter information will be shared with them immediately following your visit.   A [facility] MyChart account gives you access to today's visit and all your visits, tests, and labs performed at [facility] " click here if you don't have a [facility] MyChart account or go to [email or web address]  Consent: (Patient) Sandy [name] provided verbal consent for this virtual visit at the beginning of the encounter.  Current Medications:  Current Outpatient Medications:    promethazine (PHENERGAN) 12.5 MG tablet, Take 1 tablet (12.5 mg total) by mouth every 8 (eight) hours as needed for nausea or vomiting., Disp: 10 tablet, Rfl: 0   rizatriptan (MAXALT) 10 MG tablet, Take 1 tablet (10 mg total) by mouth as needed for migraine. May repeat in 2 hours if needed, Disp: 10 tablet, Rfl: 0   acetaminophen (TYLENOL) 500 MG tablet, Take 2 tablets (1,000 mg total) by mouth every 6 (six) hours as needed for fever or headache., Disp: 100 tablet, Rfl: 0   cholecalciferol (VITAMIN D3) 25 MCG (1000 UNIT) tablet, Take 2,000 Units by mouth daily., Disp: , Rfl:    Prenatal Vit-Fe Fumarate-FA (PRENATAL MULTIVITAMIN) TABS tablet, Take 2 tablets by mouth daily at 12 noon. 2 Gummies a day, Disp: , Rfl:    Medications ordered in this encounter:  Meds ordered this encounter  Medications   rizatriptan (MAXALT) 10 MG tablet    Sig: Take 1 tablet (10 mg total) by mouth as needed for migraine. May repeat in 2 hours if needed    Dispense:  10 tablet    Refill:  0    Order Specific Question:   Supervising Provider    Answer:   Merrilee Jansky [[id number]]   promethazine (PHENERGAN) 12.5 MG tablet    Sig: Take 1 tablet (12.5 mg total) by mouth every 8 (eight) hours as needed for nausea or vomiting.     Dispense:  10 tablet    Refill:  0    Order Specific Question:   Supervising Provider    Answer:   Merrilee Jansky [id number]     *If you need refills on other medications prior to your next appointment, please contact your pharmacy*  Follow-Up: Call back or seek an in-person evaluation if the symptoms worsen or if the condition fails to improve as anticipated.  [facility] [phone]  Other Instructions Rizatriptan Tablets What is this medication? RIZATRIPTAN (rye za TRIP tan) treats migraines. It works by blocking pain signals and narrowing blood vessels in the brain. It belongs to a group of medications called triptans. It is not used to prevent migraines. This medicine may be used for other purposes; ask your health care provider or pharmacist if you have questions. COMMON BRAND NAME(S): Maxalt What should I tell my care team before I take this medication? They need to know if you have any of these conditions: Circulation problems in fingers and toes Diabetes Heart disease High blood pressure High cholesterol History of irregular heartbeat History of stroke Stomach or intestine problems Tobacco use An unusual or allergic reaction to rizatriptan, other medications, foods, dyes, or preservatives Pregnant or trying to get pregnant Breast-feeding How should I use this medication? Take this medication by mouth with water. Take it as directed on the prescription label. Do  not use it more often than directed. Talk to your care team about the use of this medication in children. While it may be prescribed for children as young as 6 years for selected conditions, precautions do apply. Overdosage: If you think you have taken too much of this medicine contact a poison control center or emergency room at once. NOTE: This medicine is only for you. Do not share this medicine with others. What if I miss a dose? This does not apply. This medication is not for regular  use. What may interact with this medication? Do not take this medication with any of the following: Ergot alkaloids, such as dihydroergotamine, ergotamine MAOIs, such as Marplan, Nardil, Parnate Other medications for migraine headache, such as almotriptan, eletriptan, frovatriptan, naratriptan, sumatriptan, zolmitriptan This medication may also interact with the following: Certain medications for depression, anxiety, or other mental health conditions Propranolol This list may not describe all possible interactions. Give your health care provider a list of all the medicines, herbs, non-prescription drugs, or dietary supplements you use. Also tell them if you smoke, drink alcohol, or use illegal drugs. Some items may interact with your medicine. What should I watch for while using this medication? Visit your care team for regular checks on your progress. Tell your care team if your symptoms do not start to get better or if they get worse. This medication may affect your coordination, reaction time, or judgment. Do not drive or operate machinery until you know how this medication affects you. Sit up or stand slowly to reduce the risk of dizzy or fainting spells. If you take migraine medications for 10 or more days a month, your migraines may get worse. Keep a diary of headache days and medication use. Contact your care team if your migraine attacks occur more frequently. What side effects may I notice from receiving this medication? Side effects that you should report to your care team as soon as possible: Allergic reactions--skin rash, itching, hives, swelling of the face, lips, tongue, or throat Burning, pain, tingling, or color changes in the hands, arms, legs, or feet Heart attack--pain or tightness in the chest, shoulders, arms, or jaw, nausea, shortness of breath, cold or clammy skin, feeling faint or lightheaded Heart rhythm changes--fast or irregular heartbeat, dizziness, feeling faint or  lightheaded, chest pain, trouble breathing Increase in blood pressure Irritability, confusion, fast or irregular heartbeat, muscle stiffness, twitching muscles, sweating, high fever, seizure, chills, vomiting, diarrhea, which may be signs of serotonin syndrome Raynaud syndrome--cool, numb, or painful fingers or toes that may change color from pale, to blue, to red Seizures Stroke--sudden numbness or weakness of the face, arm, or leg, trouble speaking, confusion, trouble walking, loss of balance or coordination, dizziness, severe headache, change in vision Sudden or severe stomach pain, bloody diarrhea, fever, nausea, vomiting Vision loss Side effects that usually do not require medical attention (report to your care team if they continue or are bothersome): Dizziness Unusual weakness or fatigue This list may not describe all possible side effects. Call your doctor for medical advice about side effects. You may report side effects to FDA at 1-800-FDA-1088. Where should I keep my medication? Keep out of the reach of children and pets. Store at room temperature between 15 and 30 degrees C (59 and 86 degrees F). Get rid of any unused medication after the expiration date. To get rid of medications that are no longer needed or have expired: Take the medication to a medication take-back program. Check with your  pharmacy or law enforcement to find a location. If you cannot return the medication, check the label or package insert to see if the medication should be thrown out in the garbage or flushed down the toilet. If you are not sure, ask your care team. If it is safe to put it in the trash, empty the medication out of the container. Mix the medication with cat litter, dirt, coffee grounds, or other unwanted substance. Seal the mixture in a bag or container. Put it in the trash. NOTE: This sheet is a summary. It may not cover all possible information. If you have questions about this medicine, talk to  your doctor, pharmacist, or health care provider.  2024 Elsevier/Gold Standard (2021-07-27 00:00:00) Migraine Headache A migraine headache is a very strong throbbing pain on one or both sides of your head. This type of headache can also cause other symptoms. It can last from 4 hours to 3 days. Talk with your doctor about what things may bring on (trigger) this condition. What are the causes? The exact cause of a migraine is not known. This condition may be brought on or caused by: Smoking. Medicines, such as: Medicine used to treat chest pain (nitroglycerin). Birth control pills. Estrogen. Some blood pressure medicines. Certain substances in some foods or drinks. Foods and drinks, such as: Cheese. Chocolate. Alcohol. Caffeine. Doing physical activity that is very hard. Other things that may trigger a migraine headache include: Periods. Pregnancy. Hunger. Stress. Getting too much or too little sleep. Weather changes. Feeling tired (fatigue). What increases the risk? Being 19-68 years old. Being female. Having a family history of migraine headaches. Being Caucasian. Having a mental health condition, such as being sad (depressed) or feeling worried or nervous (anxious). Being very overweight (obese). What are the signs or symptoms? A throbbing pain. This pain may: Happen in any area of the head, such as on one or both sides. Make it hard to do daily activities. Get worse with physical activity. Get worse around bright lights, loud noises, or smells. Other symptoms may include: Feeling like you may vomit (nauseous). Vomiting. Dizziness. Before a migraine headache starts, you may get warning signs (an aura). An aura may include: Seeing flashing lights or having blind spots. Seeing bright spots, halos, or zigzag lines. Having tunnel vision or blurred vision. Having numbness or a tingling feeling. Having trouble talking. Having weak muscles. After a migraine ends, you may  have symptoms. These may include: Tiredness. Trouble thinking (concentrating). How is this treated? Taking medicines that: Relieve pain. Relieve the feeling like you may vomit. Prevent migraine headaches. Treatment may also include: Acupuncture. Lifestyle changes like avoiding foods that bring on migraine headaches. Learning ways to control your body functions (biofeedback). Therapy to help you know and deal with negative thoughts (cognitive behavioral therapy). Follow these instructions at home: Medicines Take over-the-counter and prescription medicines only as told by your doctor. If told, take steps to prevent problems with pooping (constipation). You may need to: Drink enough fluid to keep your pee (urine) pale yellow. Take medicines. You will be told what medicines to take. Eat foods that are high in fiber. These include beans, whole grains, and fresh fruits and vegetables. Limit foods that are high in fat and sugar. These include fried or sweet foods. Ask your doctor if you should avoid driving or using machines while you are taking your medicine. Lifestyle  Do not drink alcohol. Do not smoke or use any products that contain nicotine or tobacco. If  you need help quitting, ask your doctor. Get 7-9 hours of sleep each night, or the amount recommended by your doctor. Find ways to deal with stress, such as meditation, deep breathing, or yoga. Try to exercise often. This can help lessen how bad and how often your migraines happen. General instructions Keep a journal to find out what may bring on your migraine headaches. This can help you avoid those things. For example, write down: What you eat and drink. How much sleep you get. Any change to your medicines or diet. If you have a migraine headache: Avoid things that make your symptoms worse, such as bright lights. Lie down in a dark, quiet room. Do not drive or use machinery. Ask your doctor what activities are safe for  you. Where to find more information Coalition for Headache and Migraine Patients (CHAMP): headachemigraine.org American Migraine Foundation: americanmigrainefoundation.org National Headache Foundation: headaches.org Contact a doctor if: You get a migraine headache that is different or worse than others you have had. You have more than 15 days of headaches in one month. Get help right away if: Your migraine headache gets very bad. Your migraine headache lasts more than 72 hours. You have a fever or stiff neck. You have trouble seeing. Your muscles feel weak or like you cannot control them. You lose your balance a lot. You have trouble walking. You faint. You have a seizure. This information is not intended to replace advice given to you by your health care provider. Make sure you discuss any questions you have with your health care provider. Document Revised: 11/20/2021 Document Reviewed: 11/20/2021 Elsevier Patient Education  2024 Elsevier Inc.    If you have been instructed to have an in-person evaluation today at a local Urgent Care facility, please use the link below. It will take you to a list of all of our available [facility], including address, phone number and hours of operation. Please do not delay care.  [facility]  If you or a family member do not have a primary care provider, use the link below to schedule a visit and establish care. When you choose a [facility] primary care physician or advanced practice provider, you gain a long-term partner in health. Find a Primary Care Provider  Learn more about [facility] in-office and virtual care options: [facility] - Get Care Now

## 2022-12-12 ENCOUNTER — Ambulatory Visit: Payer: Medicaid Other | Admitting: Gastroenterology

## 2022-12-12 NOTE — Progress Notes (Deleted)
Gastroenterology Consultation  Referring Provider:     No ref. provider found Primary Care Physician:  Patient, No Pcp Per Primary Gastroenterologist:  Dr. Servando Snare     Reason for Consultation:     Nausea and vomiting        HPI:   Sandy [name] is a 32 y.o. y/o female referred for consultation & management of nausea and vomiting by Dr. Patient, No Pcp Per.  This patient comes in with a history of GERD and seeing me back in 2018 for an upper endoscopy.  Back in 2018 the patient was seen by me for the same issue of nausea and vomiting with abdominal pain.  She had previously been in the emergency department and given pain medication that she reports to have caused her to feel more nauseous.  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  She was also started on Zantac by the ED which she reported did not help.  The patient's physical exam at that time clearly showed the abdominal pain to be musculoskeletal with the pain being reproducible by 1 finger palpation.  Due to the nausea and vomiting at that time the patient was started on Zofran instead of the Phenergan she was taking and was also given samples of Dexilant at that time.     Past Medical History:  Diagnosis Date   Bicornate uterus    Complication of anesthesia    itching after C-Sections   Family history of adverse reaction to anesthesia    uncle has a hard time waking up   Genital HSV    GERD (gastroesophageal reflux disease)    Migraine    Obesity affecting pregnancy    BMI>40    Past Surgical History:  Procedure Laterality Date   CESAREAN SECTION  2012   for HSV outbreak   CESAREAN SECTION N/A 11/29/2015   Procedure: CESAREAN SECTION Baby Girl 7lb.[name];  Surgeon: Vena Austria, MD;  Location: [facility];  Service: Obstetrics;  Laterality: N/A;   CESAREAN SECTION N/A 12/18/2016   Procedure: CESAREAN SECTION;  Surgeon: Conard Novak, MD;   Location: [facility];  Service: Obstetrics;  Laterality: N/A;   CESAREAN SECTION N/A 03/30/2021   Procedure: CESAREAN SECTION;  Surgeon: Vena Austria, MD;  Location: [facility];  Service: Obstetrics;  Laterality: N/A;   ears Bilateral    tubes   ESOPHAGOGASTRODUODENOSCOPY (EGD) WITH PROPOFOL N/A 05/04/2016   Procedure: ESOPHAGOGASTRODUODENOSCOPY (EGD) WITH PROPOFOL;  Surgeon: Midge Minium, MD;  Location: [facility];  Service: Endoscopy;  Laterality: N/A;   HERNIA REPAIR  06/18/2019   roux en y  05/2020   STENT PLACEMENT ILIAC ([facility] HX) Right    leg   TONSILLECTOMY      Prior to Admission medications   Medication Sig Start Date End Date Taking? Authorizing Provider  acetaminophen (TYLENOL) 500 MG tablet Take 2 tablets (1,000 mg total) by mouth every 6 (six) hours as needed for fever or headache. 04/01/21   Schuman, Jaquelyn Bitter, MD  cholecalciferol (VITAMIN D3) 25 MCG (1000 UNIT) tablet Take 2,000 Units by mouth daily.    [provider]  Prenatal Vit-Fe Fumarate-FA (PRENATAL MULTIVITAMIN) TABS tablet Take 2 tablets by mouth daily at 12 noon. 2 Gummies a day    [provider]  promethazine (PHENERGAN) 12.5 MG tablet Take 1 tablet (12.5 mg total) by mouth every 8 (eight) hours as needed  for nausea or vomiting. 12/05/22   Freddy Finner, NP  rizatriptan (MAXALT) 10 MG tablet Take 1 tablet (10 mg total) by mouth as needed for migraine. May repeat in 2 hours if needed 12/05/22   Freddy Finner, NP    Family History  Problem Relation Age of Onset   Diabetes Maternal Aunt    Hyperlipidemia Maternal Aunt      Social History   Tobacco Use   Smoking status: Former    Current packs/day: 0.00    Types: Cigarettes    Quit date: 04/09/2013    Years since quitting: 9.6   Smokeless tobacco: Never  Vaping Use   Vaping status: Never Used  Substance Use Topics   Alcohol use: Not Currently    Comment: 2x/month, none during pregnacy   Drug use: No    Allergies as  of 12/12/2022 - Review Complete 12/05/2022  Allergen Reaction Noted   Oxycodone-acetaminophen Hives and Itching 09/17/2013   Nsaids  03/15/2021   Fluticasone Swelling 05/08/2014    Review of Systems:    All systems reviewed and negative except where noted in HPI.   Physical Exam:  There were no vitals taken for this visit. No LMP recorded. Patient has had an injection. General:   Alert,  Well-developed, well-nourished, pleasant and cooperative in NAD Head:  Normocephalic and atraumatic. Eyes:  Sclera clear, no icterus.   Conjunctiva pink. Ears:  Normal auditory acuity. Neck:  Supple; no masses or thyromegaly. Lungs:  Respirations even and unlabored.  Clear throughout to auscultation.   No wheezes, crackles, or rhonchi. No acute distress. Heart:  Regular rate and rhythm; no murmurs, clicks, rubs, or gallops. Abdomen:  Normal bowel sounds.  No bruits.  Soft, non-tender and non-distended without masses, hepatosplenomegaly or hernias noted.  No guarding or rebound tenderness.  Negative Carnett sign.   Rectal:  Deferred.  Pulses:  Normal pulses noted. Extremities:  No clubbing or edema.  No cyanosis. Neurologic:  Alert and oriented x3;  grossly normal neurologically. Skin:  Intact without significant lesions or rashes.  No jaundice. Lymph Nodes:  No significant cervical adenopathy. Psych:  Alert and cooperative. Normal mood and affect.  Imaging Studies: No results found.  Assessment and Plan:   Sandy [name] is a 32 y.o. y/o female ***    Midge Minium, MD. Clementeen Graham    Note: This dictation was prepared with Dragon dictation along with smaller phrase technology. Any transcriptional errors that result from this process are unintentional.

## 2022-12-18 ENCOUNTER — Telehealth: Payer: Medicaid Other

## 2022-12-19 ENCOUNTER — Telehealth: Payer: Medicaid Other | Admitting: Physician Assistant

## 2022-12-19 ENCOUNTER — Ambulatory Visit: Payer: Medicaid Other | Admitting: Physician Assistant

## 2022-12-19 NOTE — Progress Notes (Signed)
The patient no-showed for appointment despite this provider sending direct link with no response and waiting for at least 10 minutes from appointment time for patient to join. They will be marked as a NS for this appointment/time.   William Cody Martin, PA-C    

## 2022-12-21 ENCOUNTER — Telehealth: Payer: Self-pay

## 2022-12-21 NOTE — Telephone Encounter (Signed)
New pt was scheduled for [id number] with Wohl  due to schedule change pt wishes to see PA

## 2022-12-21 NOTE — Telephone Encounter (Signed)
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

## 2023-01-10 IMAGING — US US OB COMP LESS 14 WK
1 series · 15 of 28 positions shown · non-contrast
Comparison: Ultrasound dated August 05, 2020.

CLINICAL DATA: Left lower quadrant pain since [REDACTED]. Spotting this
morning. Estimated gestational age of 13 weeks, 1 day.

EXAM:
OBSTETRIC <14 WK ULTRASOUND
TECHNIQUE: Transabdominal ultrasound was performed for evaluation of the
gestation as well as the maternal uterus and adnexal regions.

[Series 1: us ob comp less 14 wk · 15 of 32 slices shown]
[im 1/32]
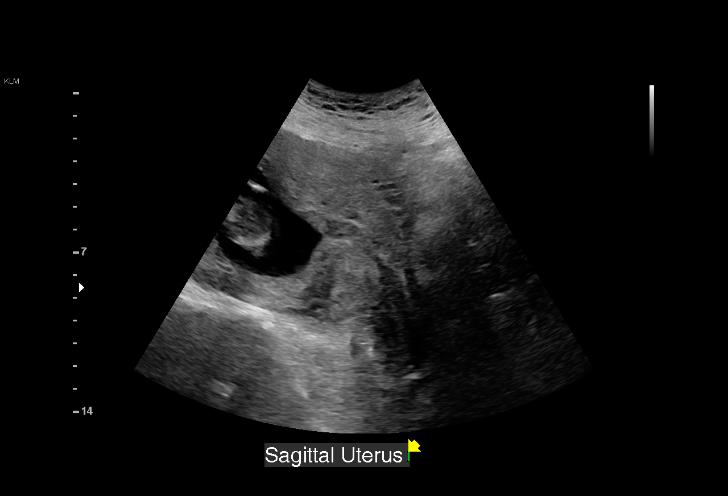
[im 3/32]
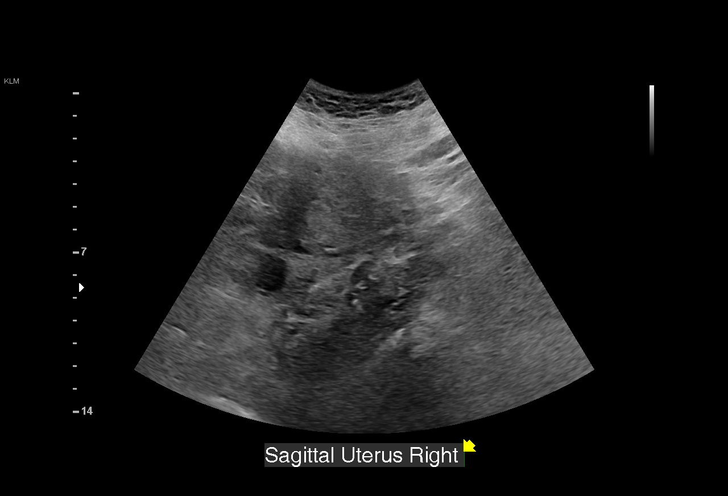
[im 5/32]
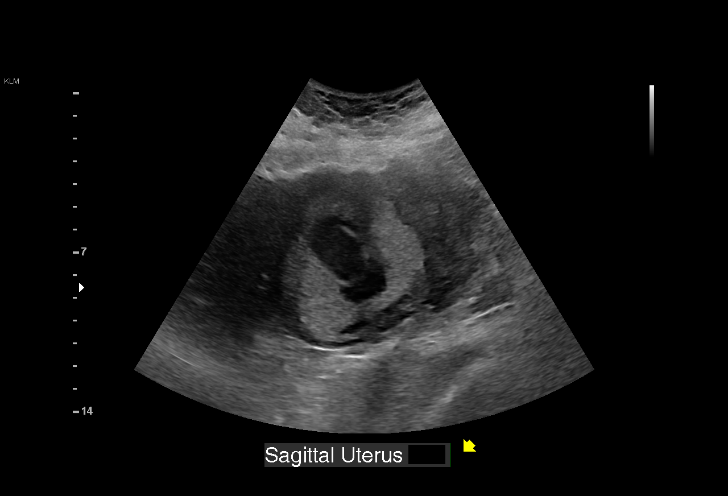
[im 7/32]
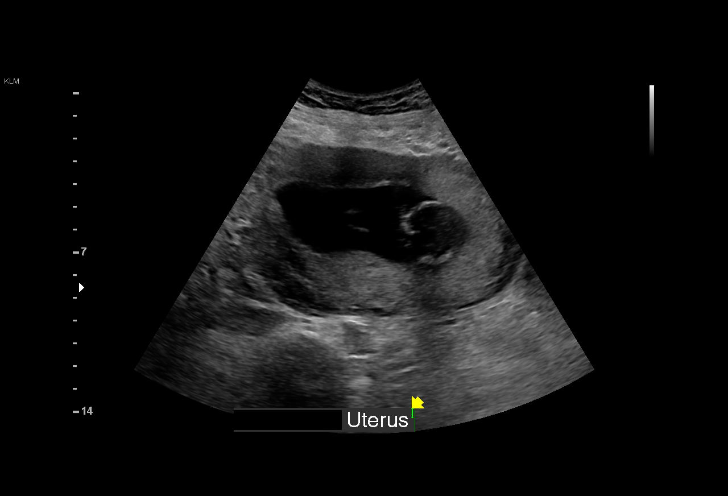
[im 10/32]
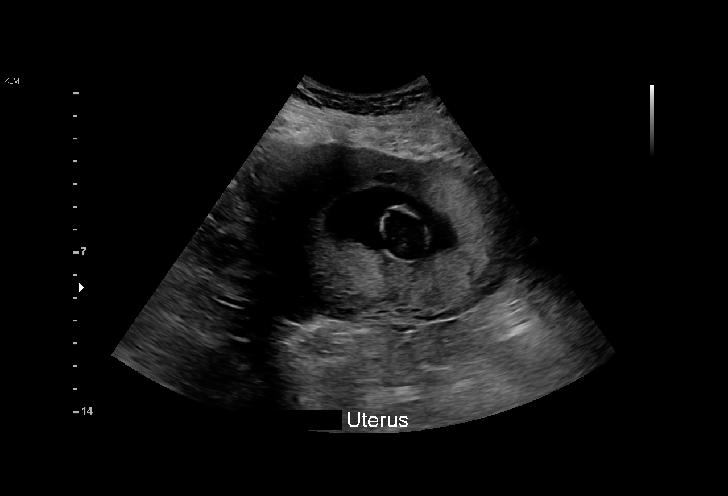
[im 12/32]
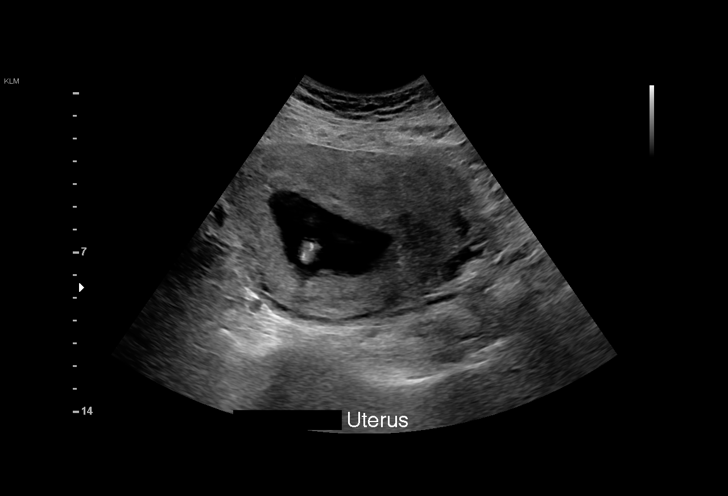
[im 14/32]
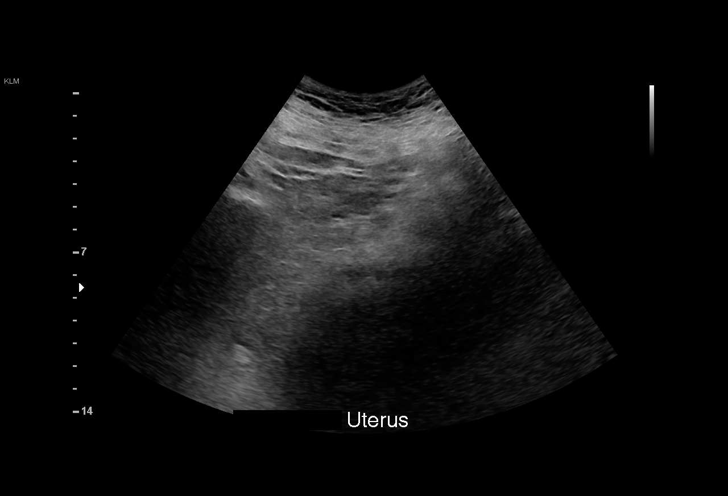
[im 17/32]
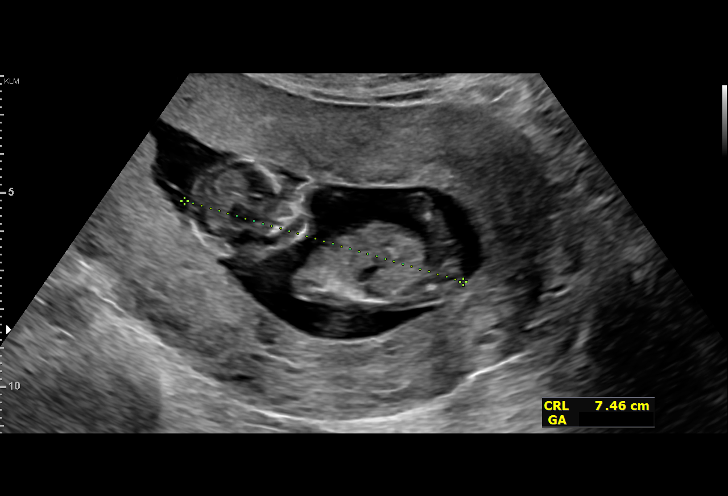
[im 18/32]
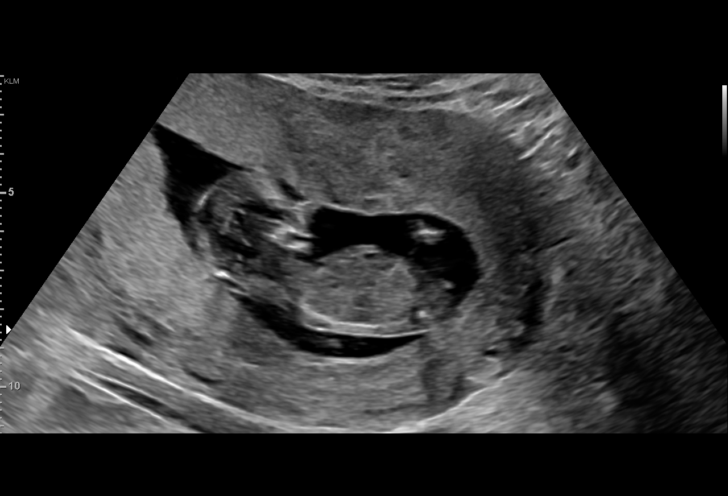
[im 20/32]
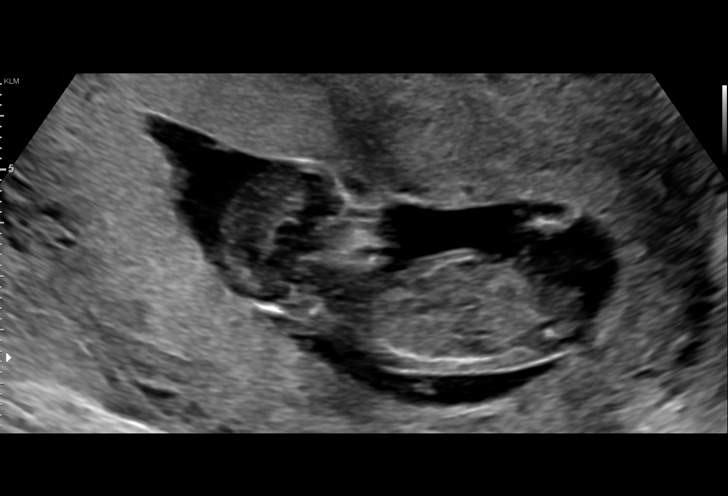
[im 22/32]
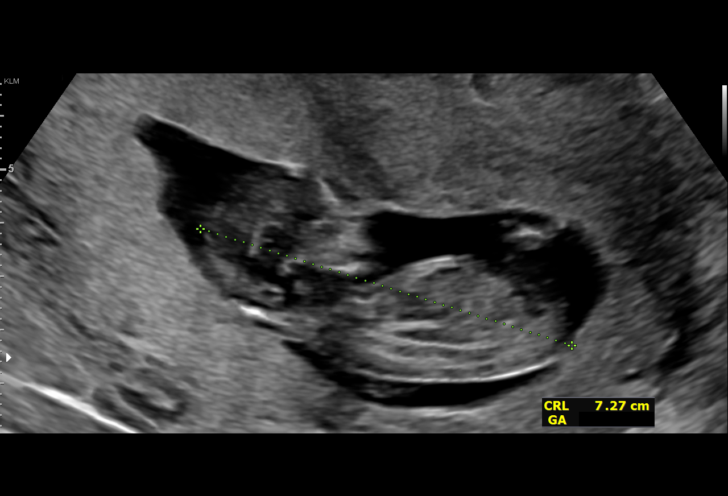
[im 25/32]
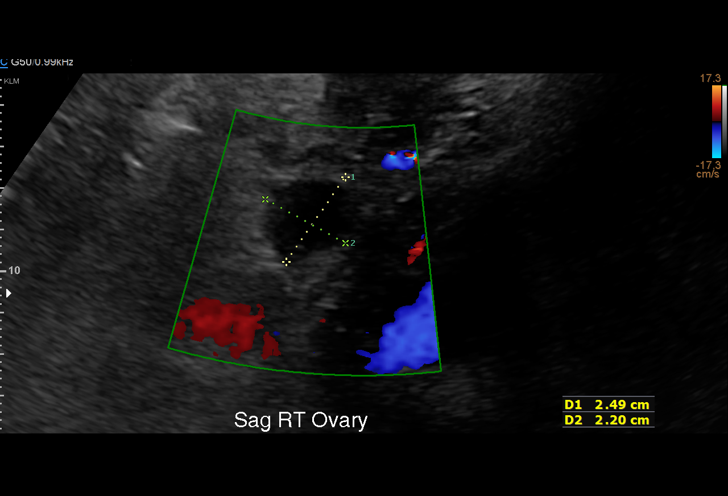
[im 27/32]
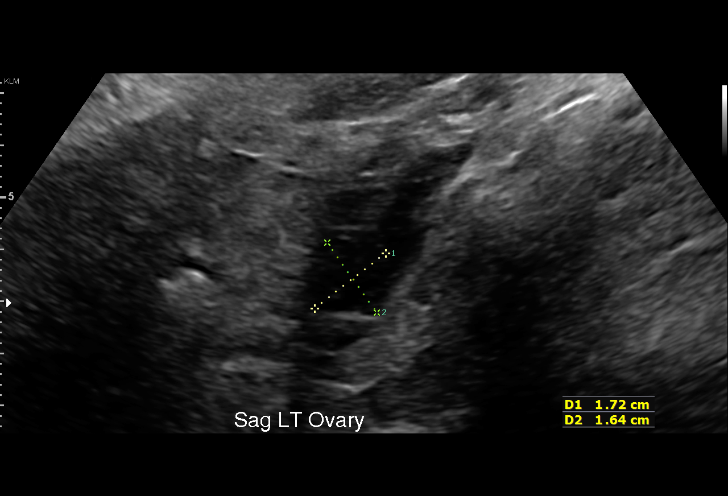
[im 29/32]
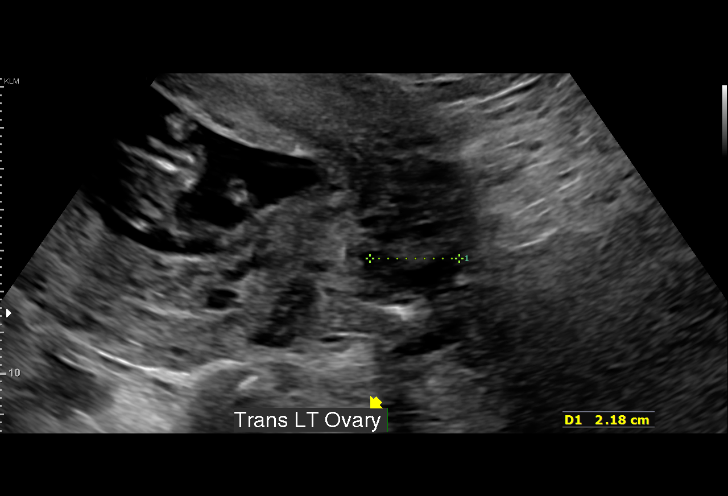
[im 32/32]
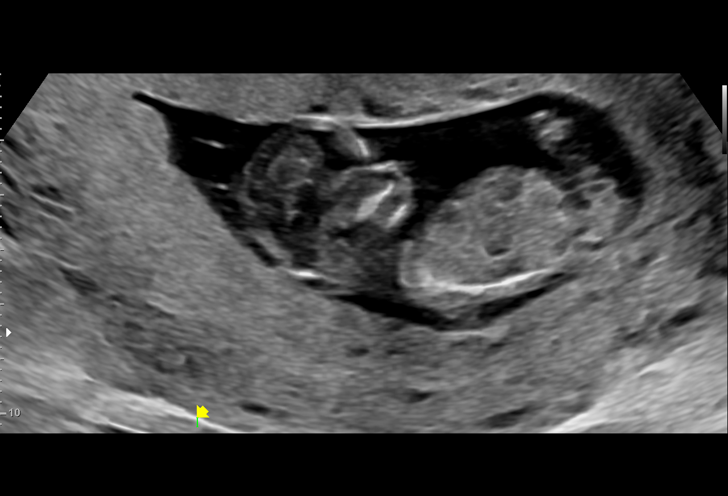

[15 of 28 positions shown; findings below may reference images not displayed]

FINDINGS: Intrauterine gestational sac: Single.

Yolk sac:  Not Visualized.

Embryo:  Visualized.

Cardiac Activity: Visualized.

Heart Rate: 160 bpm

CRL:   73.1 mm   13 w 3 d                  US EDC: 04/04/2021

Subchorionic hemorrhage:  None visualized.

Maternal uterus/adnexae: Unremarkable.
IMPRESSION: Single live intrauterine pregnancy with estimated gestational age of
13 weeks, 3 days. No acute abnormality.

## 2023-01-13 ENCOUNTER — Telehealth: Payer: Medicaid Other | Admitting: Physician Assistant

## 2023-01-13 DIAGNOSIS — A6004 Herpesviral vulvovaginitis: Secondary | ICD-10-CM | POA: Diagnosis not present

## 2023-01-13 MED ORDER — VALACYCLOVIR HCL 500 MG PO TABS
500.0000 mg | ORAL_TABLET | Freq: Two times a day (BID) | ORAL | 0 refills | Status: AC
Start: 2023-01-13 — End: 2023-01-16

## 2023-01-13 NOTE — Progress Notes (Signed)
E-Visit for Herpes Simplex  We are sorry that you are not feeling well.  Here is how we plan to help!  Based on what you have shared ith me, it looks like you may be having an outbreak/flare-up of genital herpes.    I have prescribed I have prescribed Valacyclovir 500 mg Take one by mouth twice a day for 3 days.    If you have been prescribed long term medications to be taken on a regular basis, it is important to follow the recommendations and take them as ordered.    Outbreaks usually include blisters and open sores in the genital area. Outbreaks that happen after the first time are usually not as severe and do not last as long. Genital Herpes Simplex is a commonly sexually transmitted viral infection that is found worldwide. Most of these genital infections are caused by one or two herpes simplex viruses that is passed from person to person during vaginal, oral, or anal sex. Sometimes, people do not know they have herpes because they do not have any symptoms.  Please be aware that if you have genital herpes you can be contagious even when you are not having rash or flare-up and you may not have any symptoms, even when you are taking suppressive medicines.  Herpes cannot be cured. The disease usually causes most problems during the first few years. After that, the virus is still there, but it causes few to no symptoms. Even when the virus is active, people with herpes can take medicines to reduce and help prevent symptoms.  Herpes is an infection that can cause blisters and open sores on the genital area. Herpes is caused by a virus that is passed from person to person during vaginal, oral, or anal sex. Sometimes, people do not know they have herpes because they do not have any symptoms. Herpes cannot be cured. The disease usually causes most problems during the first few years. After that, the virus is still there, but it causes few to no symptoms. Even when the virus is active, people with herpes  can take medicines to reduce and help prevent symptoms.  If you have been prescribed medications to be taken on a regular basis, it is important to follow the recommendations and take them as ordered.  Some people with herpes never have any symptoms. But other people can develop symptoms within a few weeks of being infected with the herpes virus   Symptoms usually include blisters in the genital area. In women, this area includes the vagina, buttocks, anus, or thighs. In men, this area includes the penis, scrotum, anus, butt, or thighs. The blisters can become painful open sores, which then crust over as they heal. Sometimes, people can have other symptoms that include:  ?Blisters on the mouth or lips ?Fever, headache, or pain in the joints ?Trouble urinating  Outbreaks might occur every month or more often, or just once or twice a year. Sometimes, people can tell when an outbreak will occur, because they feel itching or pain beforehand. Sometimes they do not know that an outbreak is coming because they have no symptoms. Whatever your pattern is, keep in mind that herpes outbreaks usually become less frequent over time as you get older. Certain things, called "triggers," can make outbreaks more likely to occur. These include stress, sunlight, menstrual periods,or getting sick.  Antiviral therapy can shorten the duration of symptoms and signs in primary infection, which, when untreated, can be associated with significant increase in the   symptoms of the disease.  HOME CARE Use a portable bath (such as a "Sitz bath") where you can sit in warm water for about 20 minutes. Your bathtub could also work. Avoid bubble baths.  Keep the genital area clean and dry and avoid tight clothes.  Take over-the-counter pain medicine such as acetaminophen (brand name: Tylenol) or ibuprofen sample brand names: Advil, Motrin). But avoid aspirin.  Only take medications as instructed by your medical team.  You are  most likely to spread herpes to a sex partner when you have blisters and open sores on your body. But it's also possible to spread herpes to your partner when you do not have any symptoms. That is because herpes can be present on your body without causing any symptoms, like blisters or pain.  Telling your sex partner that you have herpes can be hard. But it can help protect them, since there are ways to lower the risk of spreading the infection.   Using a condom every time you have sex  Not having sex when you have symptoms  Not having oral sex if you have blisters or open sores (in the genital area or around your mouth)  MAKE SURE YOU   Understand these instructions. Do not have sex without using a condom until you have been seen by a doctor and as instructed by the provider If you are not better or improved within 7 days, you MUST have a follow up at your doctor or the health department for evaluation. There are other causes of rashes in the genital region.  Thank you for choosing an e-visit.  Your e-visit answers were reviewed by a board certified advanced clinical practitioner to complete your personal care plan. Depending upon the condition, your plan could have included both over the counter or prescription medications.  Please review your pharmacy choice. Make sure the pharmacy is open so you can pick up prescription now. If there is a problem, you may contact your provider through MyChart messaging and have the prescription routed to another pharmacy.  Your safety is important to us. If you have drug allergies check your prescription carefully.   For the next 24 hours you can use MyChart to ask questions about today's visit, request a non-urgent call back, or ask for a work or school excuse. You will get an email in the next two days asking about your experience. I hope that your e-visit has been valuable and will speed your recovery.   I have spent 5 minutes in review of e-visit  questionnaire, review and updating patient chart, medical decision making and response to patient.   Deric Bocock M Wenzel Backlund, PA-C  

## 2023-01-14 ENCOUNTER — Telehealth: Payer: Medicaid Other | Admitting: Physician Assistant

## 2023-01-14 DIAGNOSIS — G43809 Other migraine, not intractable, without status migrainosus: Secondary | ICD-10-CM

## 2023-01-14 NOTE — Progress Notes (Signed)
   Thank you for the details you included in the comment boxes. Those details are very helpful in determining the best course of treatment for you and help [place] to provide the best care. Because of the migraine, we recommend that you schedule a Virtual Urgent Care video visit in order for the provider to better assess what is going on.  The provider will be able to give you a more accurate diagnosis and treatment plan if we can more freely discuss your symptoms and with the addition of a virtual examination.   If you change your visit to a video visit, we will bill your insurance (similar to an office visit) and you will not be charged for this e-Visit. You will be able to stay at home and speak with the first available [facility] advanced practice provider. The link to do a video visit is in the drop down Menu tab of your Welcome screen in MyChart.     I have spent 5 minutes in review of e-visit questionnaire, review and updating patient chart, medical decision making and response to patient.   Margaretann Loveless, PA-C

## 2023-01-16 ENCOUNTER — Ambulatory Visit: Payer: Medicaid Other

## 2023-01-17 ENCOUNTER — Telehealth: Payer: Medicaid Other | Admitting: Physician Assistant

## 2023-01-17 ENCOUNTER — Other Ambulatory Visit (INDEPENDENT_AMBULATORY_CARE_PROVIDER_SITE_OTHER): Payer: Self-pay | Admitting: Family Medicine

## 2023-01-17 DIAGNOSIS — R519 Headache, unspecified: Secondary | ICD-10-CM

## 2023-01-17 DIAGNOSIS — B009 Herpesviral infection, unspecified: Secondary | ICD-10-CM

## 2023-01-17 MED ORDER — VALACYCLOVIR HCL 500 MG PO TABS: 500.0000 mg | ORAL_TABLET | Freq: Two times a day (BID) | ORAL | 0 refills | Status: AC

## 2023-01-17 NOTE — Progress Notes (Signed)
E-Visit for Herpes Simplex  We are sorry that you are not feeling well.  Here is how we plan to help!  Based on what you have shared ith me, it looks like you may be having an outbreak/flare-up of genital herpes.    I have prescribed I have prescribed Valacyclovir 500 mg Take one by mouth twice a day for 3 days.    If you have been prescribed long term medications to be taken on a regular basis, it is important to follow the recommendations and take them as ordered.    Outbreaks usually include blisters and open sores in the genital area. Outbreaks that happen after the first time are usually not as severe and do not last as long. Genital Herpes Simplex is a commonly sexually transmitted viral infection that is found worldwide. Most of these genital infections are caused by one or two herpes simplex viruses that is passed from person to person during vaginal, oral, or anal sex. Sometimes, people do not know they have herpes because they do not have any symptoms.  Please be aware that if you have genital herpes you can be contagious even when you are not having rash or flare-up and you may not have any symptoms, even when you are taking suppressive medicines.  Herpes cannot be cured. The disease usually causes most problems during the first few years. After that, the virus is still there, but it causes few to no symptoms. Even when the virus is active, people with herpes can take medicines to reduce and help prevent symptoms.  Herpes is an infection that can cause blisters and open sores on the genital area. Herpes is caused by a virus that is passed from person to person during vaginal, oral, or anal sex. Sometimes, people do not know they have herpes because they do not have any symptoms. Herpes cannot be cured. The disease usually causes most problems during the first few years. After that, the virus is still there, but it causes few to no symptoms. Even when the virus is active, people with herpes  can take medicines to reduce and help prevent symptoms.  If you have been prescribed medications to be taken on a regular basis, it is important to follow the recommendations and take them as ordered.  Some people with herpes never have any symptoms. But other people can develop symptoms within a few weeks of being infected with the herpes virus   Symptoms usually include blisters in the genital area. In women, this area includes the vagina, buttocks, anus, or thighs. In men, this area includes the penis, scrotum, anus, butt, or thighs. The blisters can become painful open sores, which then crust over as they heal. Sometimes, people can have other symptoms that include:  ?Blisters on the mouth or lips ?Fever, headache, or pain in the joints ?Trouble urinating  Outbreaks might occur every month or more often, or just once or twice a year. Sometimes, people can tell when an outbreak will occur, because they feel itching or pain beforehand. Sometimes they do not know that an outbreak is coming because they have no symptoms. Whatever your pattern is, keep in mind that herpes outbreaks usually become less frequent over time as you get older. Certain things, called "triggers," can make outbreaks more likely to occur. These include stress, sunlight, menstrual periods,or getting sick.  Antiviral therapy can shorten the duration of symptoms and signs in primary infection, which, when untreated, can be associated with significant increase in the  symptoms of the disease.  HOME CARE Use a portable bath (such as a "Sitz bath") where you can sit in warm water for about 20 minutes. Your bathtub could also work. Avoid bubble baths.  Keep the genital area clean and dry and avoid tight clothes.  Take over-the-counter pain medicine such as acetaminophen (brand name: Tylenol) or ibuprofen sample brand names: Advil, Motrin). But avoid aspirin.  Only take medications as instructed by your medical team.  You are  most likely to spread herpes to a sex partner when you have blisters and open sores on your body. But it's also possible to spread herpes to your partner when you do not have any symptoms. That is because herpes can be present on your body without causing any symptoms, like blisters or pain.  Telling your sex partner that you have herpes can be hard. But it can help protect them, since there are ways to lower the risk of spreading the infection.   Using a condom every time you have sex  Not having sex when you have symptoms  Not having oral sex if you have blisters or open sores (in the genital area or around your mouth)  MAKE SURE YOU   Understand these instructions. Do not have sex without using a condom until you have been seen by a doctor and as instructed by the provider If you are not better or improved within 7 days, you MUST have a follow up at your doctor or the health department for evaluation. There are other causes of rashes in the genital region.  Thank you for choosing an e-visit.  Your e-visit answers were reviewed by a board certified advanced clinical practitioner to complete your personal care plan. Depending upon the condition, your plan could have included both over the counter or prescription medications.  Please review your pharmacy choice. Make sure the pharmacy is open so you can pick up prescription now. If there is a problem, you may contact your provider through [employer] and have the prescription routed to another pharmacy.  Your safety is important to [place]. If you have drug allergies check your prescription carefully.   For the next 24 hours you can use MyChart to ask questions about today's visit, request a non-urgent call back, or ask for a work or school excuse. You will get an email in the next two days asking about your experience. I hope that your e-visit has been valuable and will speed your recovery.   I have spent 5 minutes in review of e-visit  questionnaire, review and updating patient chart, medical decision making and response to patient.   Lenise Arena Ward, PA-C

## 2023-01-21 ENCOUNTER — Other Ambulatory Visit: Payer: Self-pay

## 2023-01-22 NOTE — Progress Notes (Unsigned)
Celso Amy, PA-C [address]  [address]  [place], [place] [address]  Main: [phone]  Fax: [phone]   Gastroenterology Consultation  Referring Provider:     No ref. provider found Primary Care Physician:  Patient, No Pcp Per Primary Gastroenterologist:  *** Reason for Consultation:     Nausea and vomiting        HPI:   Sandy [name] is a 32 y.o. y/o female referred for consultation & management  by Patient, No Pcp Per.  ***  Past Medical History:  Diagnosis Date   Bicornate uterus    Complication of anesthesia    itching after C-Sections   Family history of adverse reaction to anesthesia    uncle has a hard time waking up   Genital HSV    GERD (gastroesophageal reflux disease)    Migraine    Obesity affecting pregnancy    BMI>40    Past Surgical History:  Procedure Laterality Date   CESAREAN SECTION  2012   for HSV outbreak   CESAREAN SECTION N/A 11/29/2015   Procedure: CESAREAN SECTION Baby Girl 7lb.[name];  Surgeon: Vena Austria, MD;  Location: [facility];  Service: Obstetrics;  Laterality: N/A;   CESAREAN SECTION N/A 12/18/2016   Procedure: CESAREAN SECTION;  Surgeon: Conard Novak, MD;  Location: [facility];  Service: Obstetrics;  Laterality: N/A;   CESAREAN SECTION N/A 03/30/2021   Procedure: CESAREAN SECTION;  Surgeon: Vena Austria, MD;  Location: [facility];  Service: Obstetrics;  Laterality: N/A;   ears Bilateral    tubes   ESOPHAGOGASTRODUODENOSCOPY (EGD) WITH PROPOFOL N/A 05/04/2016   Procedure: ESOPHAGOGASTRODUODENOSCOPY (EGD) WITH PROPOFOL;  Surgeon: Midge Minium, MD;  Location: [facility];  Service: Endoscopy;  Laterality: N/A;   HERNIA REPAIR  06/18/2019   roux en y  05/2020   STENT PLACEMENT ILIAC ([facility] HX) Right    leg   TONSILLECTOMY      Prior to Admission medications   Medication Sig Start Date End Date Taking? Authorizing Provider  acetaminophen (TYLENOL) 500 MG tablet Take 2 tablets (1,000 mg total) by  mouth every 6 (six) hours as needed for fever or headache. 04/01/21   Schuman, Jaquelyn Bitter, MD  Blood Glucose Monitoring Suppl (GLUCOCOM BLOOD GLUCOSE MONITOR) DEVI USE AS DIRECTED TO CHECK GLUCOSE 4 TIMES DAILY    [provider]  cholecalciferol (VITAMIN D3) 25 MCG (1000 UNIT) tablet Take 2,000 Units by mouth daily.    [provider]  meclizine (ANTIVERT) 25 MG tablet TAKE 1 TABLET BY MOUTH TWICE DAILY AS NEEDED FOR DIZZINESS    [provider]  metFORMIN (GLUCOPHAGE) 500 MG tablet Take 1 tablet by mouth 2 (two) times daily with a meal.    [provider]  Norethindrone Acet-Ethinyl Est (MICROGESTIN 1/20 PO) Take 1 tablet by mouth daily.    [provider]  NORETHINDRONE PO Take 1 tablet by mouth daily.    [provider]  Prenatal Vit-Fe Fumarate-FA (PRENATAL MULTIVITAMIN) TABS tablet Take 2 tablets by mouth daily at 12 noon. 2 Gummies a day    [provider]  Prenatal Vit-Fe Fumarate-FA (PRENATAL VITAMIN PO)     [provider]  promethazine (PHENERGAN) 12.5 MG tablet Take 1 tablet (12.5 mg total) by mouth every 8 (eight) hours as needed for nausea or vomiting. 12/05/22   Freddy Finner, NP  rizatriptan (MAXALT) 10 MG tablet Take 1 tablet (10 mg total) by mouth as needed for migraine. May repeat  in 2 hours if needed 12/05/22   Freddy Finner, NP    Family History  Problem Relation Age of Onset   Diabetes Maternal Aunt    Hyperlipidemia Maternal Aunt      Social History   Tobacco Use   Smoking status: Former    Current packs/day: 0.00    Types: Cigarettes    Quit date: 04/09/2013    Years since quitting: 9.7   Smokeless tobacco: Never  Vaping Use   Vaping status: Never Used  Substance Use Topics   Alcohol use: Not Currently    Comment: 2x/month, none during pregnacy   Drug use: No    Allergies as of 01/23/2023 - Review Complete 01/14/2023  Allergen Reaction Noted   Oxycodone-acetaminophen Hives, Itching,  and Other (See Comments) 09/17/2013   Silicone Hives and Other (See Comments) 06/16/2019   Metoclopramide Other (See Comments) 11/29/2022   Nsaids  03/15/2021   Oxycodone hcl  01/21/2023   Fluticasone Swelling 05/08/2014    Review of Systems:    All systems reviewed and negative except where noted in HPI.   Physical Exam:  There were no vitals taken for this visit. No LMP recorded. Patient has had an injection.  General:   Alert,  Well-developed, well-nourished, pleasant and cooperative in NAD Lungs:  Respirations even and unlabored.  Clear throughout to auscultation.   No wheezes, crackles, or rhonchi. No acute distress. Heart:  Regular rate and rhythm; no murmurs, clicks, rubs, or gallops. Abdomen:  Normal bowel sounds.  No bruits.  Soft, and non-distended without masses, hepatosplenomegaly or hernias noted.  No Tenderness.  No guarding or rebound tenderness.    Neurologic:  Alert and oriented x3;  grossly normal neurologically. Psych:  Alert and cooperative. Normal mood and affect.  Imaging Studies: No results found.  Assessment and Plan:   Sandy [name] is a 32 y.o. y/o female has been referred for ***  Follow up ***  Celso Amy, PA-C    BP check ***

## 2023-01-23 ENCOUNTER — Ambulatory Visit: Payer: Medicaid Other | Admitting: Gastroenterology

## 2023-01-23 ENCOUNTER — Ambulatory Visit: Payer: Medicaid Other | Admitting: Physician Assistant

## 2023-01-23 ENCOUNTER — Encounter: Payer: Self-pay | Admitting: Physician Assistant

## 2023-01-23 VITALS — BP 112/78 | HR 73 | Temp 97.8°F | Ht 65.5 in | Wt 163.0 lb

## 2023-01-23 DIAGNOSIS — K219 Gastro-esophageal reflux disease without esophagitis: Secondary | ICD-10-CM | POA: Diagnosis not present

## 2023-01-23 DIAGNOSIS — K2950 Unspecified chronic gastritis without bleeding: Secondary | ICD-10-CM | POA: Diagnosis not present

## 2023-01-23 DIAGNOSIS — R112 Nausea with vomiting, unspecified: Secondary | ICD-10-CM | POA: Diagnosis not present

## 2023-01-23 DIAGNOSIS — Z9884 Bariatric surgery status: Secondary | ICD-10-CM

## 2023-01-23 DIAGNOSIS — R194 Change in bowel habit: Secondary | ICD-10-CM | POA: Diagnosis not present

## 2023-01-23 DIAGNOSIS — Z9889 Other specified postprocedural states: Secondary | ICD-10-CM

## 2023-01-23 DIAGNOSIS — K2930 Chronic superficial gastritis without bleeding: Secondary | ICD-10-CM

## 2023-01-23 DIAGNOSIS — R198 Other specified symptoms and signs involving the digestive system and abdomen: Secondary | ICD-10-CM

## 2023-01-23 MED ORDER — OMEPRAZOLE 20 MG PO CPDR
20.0000 mg | DELAYED_RELEASE_CAPSULE | Freq: Every day | ORAL | 1 refills | Status: AC
Start: 2023-01-23 — End: 2023-12-04

## 2023-01-23 MED ORDER — SUCRALFATE 1 G PO TABS
1.0000 g | ORAL_TABLET | Freq: Three times a day (TID) | ORAL | 5 refills | Status: DC
Start: 2023-01-23 — End: 2023-05-22

## 2023-01-23 MED ORDER — ONDANSETRON HCL 4 MG PO TABS
4.0000 mg | ORAL_TABLET | Freq: Three times a day (TID) | ORAL | 2 refills | Status: AC | PRN
Start: 2023-01-23 — End: 2023-04-23

## 2023-02-27 NOTE — Progress Notes (Deleted)
Sandy Amy, PA-C [address]  [address]  [place], [place] [address]  Main: [phone]  Fax: [phone]   Primary Care Physician: Patient, No Pcp Per  Primary Gastroenterologist:  ***  CC: 1 month f/u chronic N/V, Gastritis, GERD  HPI: MELESA [name] is a 32 y.o. female  Hx chronic intermittent nausea and vomiting for many years.  Diagnosed with chronic gastritis on EGD in 2018.     Has had multiple abdominal surgeries including cholecystectomy in 2023, Roux-en-Y gastric bypass in 2022, 5 C-sections, and abdominal wall hernia repair in 2020.  She lost from 325 pounds down to 163 pounds since her gastric bypass surgery.  She noted worsening nausea after gastric bypass and cholecystectomy.     Has bowel irregularities with episodes of diarrhea alternating with constipation.  She denies rectal bleeding.  Nausea vomiting comes and goes.  Occasional dry heaves.  She is able to keep food down.  Has tried Tums which helps.  Started OTC Prilosec 20 mg for the past 3 weeks which has also helped.  She denies marijuana, NSAID, drug or alcohol use.  She has 5 children ages 44-year-old to 23 years old.  She is not currently breast-feeding.   She was seen at [facility] ED 01/16/2023 to evaluate left flank pain which radiated to her left hip.  No acute abnormality was found.  CBC and CMP labs normal.  CT abdomen pelvis with contrast no acute abnormality.  UA, urine culture, and urine pregnancy test negative.  Normal hemoglobin 13.6, WBC 6.1.  Normal LFTs.   Last EGD by Dr. Servando Snare 04/2016 (to evaluate nausea, vomiting, epigastric pain) showed mild chronic gastritis, otherwise normal.  Biopsies negative for H. pylori  Current Outpatient Medications  Medication Sig Dispense Refill   acetaminophen (TYLENOL) 500 MG tablet Take 2 tablets (1,000 mg total) by mouth every 6 (six) hours as needed for fever or headache. 100 tablet 0   Blood Glucose Monitoring Suppl (GLUCOCOM BLOOD GLUCOSE MONITOR) DEVI USE AS  DIRECTED TO CHECK GLUCOSE 4 TIMES DAILY     cholecalciferol (VITAMIN D3) 25 MCG (1000 UNIT) tablet Take 2,000 Units by mouth daily.     omeprazole (PRILOSEC) 20 MG capsule Take 1 capsule (20 mg total) by mouth daily. 90 capsule 1   ondansetron (ZOFRAN) 4 MG tablet Take 1 tablet (4 mg total) by mouth every 8 (eight) hours as needed for nausea or vomiting. 30 tablet 2   Prenatal Vit-Fe Fumarate-FA (PRENATAL MULTIVITAMIN) TABS tablet Take 2 tablets by mouth daily at 12 noon. 2 Gummies a day     Prenatal Vit-Fe Fumarate-FA (PRENATAL VITAMIN PO)      promethazine (PHENERGAN) 12.5 MG tablet Take 1 tablet (12.5 mg total) by mouth every 8 (eight) hours as needed for nausea or vomiting. 10 tablet 0   rizatriptan (MAXALT) 10 MG tablet Take 1 tablet (10 mg total) by mouth as needed for migraine. May repeat in 2 hours if needed 10 tablet 0   sucralfate (CARAFATE) 1 g tablet Take 1 tablet (1 g total) by mouth 3 (three) times daily before meals. 90 tablet 5   No current facility-administered medications for this visit.    Allergies as of 02/28/2023 - Review Complete 01/23/2023  Allergen Reaction Noted   Oxycodone-acetaminophen Hives, Itching, and Other (See Comments) 09/17/2013   Silicone Hives and Other (See Comments) 06/16/2019   Metoclopramide Other (See Comments) 11/29/2022   Nsaids  03/15/2021   Oxycodone hcl  01/21/2023   Fluticasone  Swelling 05/08/2014    Past Medical History:  Diagnosis Date   Bicornate uterus    Complication of anesthesia    itching after C-Sections   Family history of adverse reaction to anesthesia    uncle has a hard time waking up   Genital HSV    GERD (gastroesophageal reflux disease)    Migraine    Obesity affecting pregnancy    BMI>40    Past Surgical History:  Procedure Laterality Date   CESAREAN SECTION  2012   for HSV outbreak   CESAREAN SECTION N/A 11/29/2015   Procedure: CESAREAN SECTION Baby Girl 7lb.Sandy [name].;  Surgeon: Vena Austria, MD;  Location:  [facility] ORS;  Service: Obstetrics;  Laterality: N/A;   CESAREAN SECTION N/A 12/18/2016   Procedure: CESAREAN SECTION;  Surgeon: Conard Novak, MD;  Location: [facility] ORS;  Service: Obstetrics;  Laterality: N/A;   CESAREAN SECTION N/A 03/30/2021   Procedure: CESAREAN SECTION;  Surgeon: Vena Austria, MD;  Location: [facility] ORS;  Service: Obstetrics;  Laterality: N/A;   ears Bilateral    tubes   ESOPHAGOGASTRODUODENOSCOPY (EGD) WITH PROPOFOL N/A 05/04/2016   Procedure: ESOPHAGOGASTRODUODENOSCOPY (EGD) WITH PROPOFOL;  Surgeon: Midge Minium, MD;  Location: [facility];  Service: Endoscopy;  Laterality: N/A;   HERNIA REPAIR  06/18/2019   roux en y  05/2020   STENT PLACEMENT ILIAC [facility] HX) Right    leg   TONSILLECTOMY      Review of Systems:    All systems reviewed and negative except where noted in HPI.   Physical Examination:   There were no vitals taken for this visit.  General: Well-nourished, well-developed in no acute distress.  Lungs: Clear to auscultation bilaterally. Non-labored. Heart: Regular rate and rhythm, no murmurs rubs or gallops.  Abdomen: Bowel sounds are normal; Abdomen is Soft; No hepatosplenomegaly, masses or hernias;  No Abdominal Tenderness; No guarding or rebound tenderness. Neuro: Alert and oriented x 3.  Grossly intact.  Psych: Alert and cooperative, normal mood and affect.   Imaging Studies: No results found.  Assessment and Plan:   Sandy [name] is a 32 y.o. y/o female ***  1.  Chronic intermittent nausea and vomiting; suspect new to chronic gastritis and adverse effect of previous gastric bypass surgery.             Rx Zofran 4 Mg every 8 hours as needed             Start treatment for gastritis and GERD with Carafate and Prilosec             If symptoms persist, EGD is next step.             Recent abdominal pelvic CT and labs were normal.   2.  Chronic gastritis (H. pylori negative)             Rx Carafate 1 g 3 times daily   3.   GERD             Rx Prilosec 20 Mg once daily   4.  Irregular bowel habits             Start OTC fiber Gummies daily for diarrhea and constipation             Take OTC MiraLAX powder mix 1 capful in a drink once daily as needed for constipation   5.  History of multiple abdominal surgeries: Cholecystectomy 2023, gastric bypass in 2022, 5 C-sections (last C-section 2023)  Inetta Fermo  Gerre Pebbles, PA-C  Follow up ***  BP check ***

## 2023-02-28 ENCOUNTER — Ambulatory Visit: Payer: Medicaid Other | Admitting: Physician Assistant

## 2023-03-05 ENCOUNTER — Telehealth: Payer: Medicaid Other | Admitting: Physician Assistant

## 2023-03-05 DIAGNOSIS — A6004 Herpesviral vulvovaginitis: Secondary | ICD-10-CM | POA: Diagnosis not present

## 2023-03-06 MED ORDER — VALACYCLOVIR HCL 500 MG PO TABS
500.0000 mg | ORAL_TABLET | Freq: Two times a day (BID) | ORAL | 0 refills | Status: AC
Start: 2023-03-06 — End: 2023-03-09

## 2023-03-06 NOTE — Progress Notes (Signed)
E-Visit for Herpes Simplex  We are sorry that you are not feeling well.  Here is how we plan to help!  Based on what you have shared ith me, it looks like you may be having an outbreak/flare-up of genital herpes.    I have prescribed I have prescribed Valacyclovir 500 mg Take one by mouth twice a day for 3 days.    If you have been prescribed long term medications to be taken on a regular basis, it is important to follow the recommendations and take them as ordered.    Outbreaks usually include blisters and open sores in the genital area. Outbreaks that happen after the first time are usually not as severe and do not last as long. Genital Herpes Simplex is a commonly sexually transmitted viral infection that is found worldwide. Most of these genital infections are caused by one or two herpes simplex viruses that is passed from person to person during vaginal, oral, or anal sex. Sometimes, people do not know they have herpes because they do not have any symptoms.  Please be aware that if you have genital herpes you can be contagious even when you are not having rash or flare-up and you may not have any symptoms, even when you are taking suppressive medicines.  Herpes cannot be cured. The disease usually causes most problems during the first few years. After that, the virus is still there, but it causes few to no symptoms. Even when the virus is active, people with herpes can take medicines to reduce and help prevent symptoms.  Herpes is an infection that can cause blisters and open sores on the genital area. Herpes is caused by a virus that is passed from person to person during vaginal, oral, or anal sex. Sometimes, people do not know they have herpes because they do not have any symptoms. Herpes cannot be cured. The disease usually causes most problems during the first few years. After that, the virus is still there, but it causes few to no symptoms. Even when the virus is active, people with herpes  can take medicines to reduce and help prevent symptoms.  If you have been prescribed medications to be taken on a regular basis, it is important to follow the recommendations and take them as ordered.  Some people with herpes never have any symptoms. But other people can develop symptoms within a few weeks of being infected with the herpes virus   Symptoms usually include blisters in the genital area. In women, this area includes the vagina, buttocks, anus, or thighs. In men, this area includes the penis, scrotum, anus, butt, or thighs. The blisters can become painful open sores, which then crust over as they heal. Sometimes, people can have other symptoms that include:  ?Blisters on the mouth or lips ?Fever, headache, or pain in the joints ?Trouble urinating  Outbreaks might occur every month or more often, or just once or twice a year. Sometimes, people can tell when an outbreak will occur, because they feel itching or pain beforehand. Sometimes they do not know that an outbreak is coming because they have no symptoms. Whatever your pattern is, keep in mind that herpes outbreaks usually become less frequent over time as you get older. Certain things, called "triggers," can make outbreaks more likely to occur. These include stress, sunlight, menstrual periods,or getting sick.  Antiviral therapy can shorten the duration of symptoms and signs in primary infection, which, when untreated, can be associated with significant increase in the  symptoms of the disease.  HOME CARE Use a portable bath (such as a "Sitz bath") where you can sit in warm water for about 20 minutes. Your bathtub could also work. Avoid bubble baths.  Keep the genital area clean and dry and avoid tight clothes.  Take over-the-counter pain medicine such as acetaminophen (brand name: Tylenol) or ibuprofen sample brand names: Advil, Motrin). But avoid aspirin.  Only take medications as instructed by your medical team.  You are  most likely to spread herpes to a sex partner when you have blisters and open sores on your body. But it's also possible to spread herpes to your partner when you do not have any symptoms. That is because herpes can be present on your body without causing any symptoms, like blisters or pain.  Telling your sex partner that you have herpes can be hard. But it can help protect them, since there are ways to lower the risk of spreading the infection.   Using a condom every time you have sex  Not having sex when you have symptoms  Not having oral sex if you have blisters or open sores (in the genital area or around your mouth)  MAKE SURE YOU   Understand these instructions. Do not have sex without using a condom until you have been seen by a doctor and as instructed by the provider If you are not better or improved within 7 days, you MUST have a follow up at your doctor or the health department for evaluation. There are other causes of rashes in the genital region.  Thank you for choosing an e-visit.  Your e-visit answers were reviewed by a board certified advanced clinical practitioner to complete your personal care plan. Depending upon the condition, your plan could have included both over the counter or prescription medications.  Please review your pharmacy choice. Make sure the pharmacy is open so you can pick up prescription now. If there is a problem, you may contact your provider through [employer] and have the prescription routed to another pharmacy.  Your safety is important to [place]. If you have drug allergies check your prescription carefully.   For the next 24 hours you can use MyChart to ask questions about today's visit, request a non-urgent call back, or ask for a work or school excuse. You will get an email in the next two days asking about your experience. I hope that your e-visit has been valuable and will speed your recovery.

## 2023-03-06 NOTE — Progress Notes (Signed)
I have spent 5 minutes in review of e-visit questionnaire, review and updating patient chart, medical decision making and response to patient.   Mia Milan Cody Jacklynn Dehaas, PA-C    

## 2023-03-16 ENCOUNTER — Ambulatory Visit: Payer: Medicaid Other

## 2023-03-16 ENCOUNTER — Ambulatory Visit
Admission: EM | Admit: 2023-03-16 | Discharge: 2023-03-16 | Disposition: A | Discharge status: Home / Self Care | Payer: Medicaid Other | Attending: Physician Assistant | Admitting: Physician Assistant

## 2023-03-16 DIAGNOSIS — S59902A Unspecified injury of left elbow, initial encounter: Secondary | ICD-10-CM

## 2023-03-16 DIAGNOSIS — S4992XA Unspecified injury of left shoulder and upper arm, initial encounter: Secondary | ICD-10-CM

## 2023-03-16 DIAGNOSIS — M79602 Pain in left arm: Secondary | ICD-10-CM

## 2023-03-16 DIAGNOSIS — S60222A Contusion of left hand, initial encounter: Secondary | ICD-10-CM

## 2023-03-16 NOTE — ED Triage Notes (Signed)
Pt states that her shoulder hurts when moving her arm but her hand hurts constantly.

## 2023-03-16 NOTE — Discharge Instructions (Signed)
-  I did not see any fractures on your x-rays. - I would advise you take Tylenol as needed for pain relief and apply ice to the areas that hurts.  You may use topical Voltaren gel which is an anti-inflammatory medication. - You can use heat, muscle rubs and perform some stretches.  Follow-up as needed.

## 2023-03-16 NOTE — ED Triage Notes (Signed)
Pt c/o left shoulder pain and hand pain.  Pt was in the [employer] and was attempting to get onto her float which was still moving. Pt states that she dived onto the float and is now bruised along her shoulder, arm, and hand.  Pt states that her thumb was caught in between 2 boards on the float.  Pt states that she can not use ibuprofen due to weight loss surgery.  Pt has tried to use ice for the pain and swelling.

## 2023-03-16 NOTE — ED Provider Notes (Signed)
[facility]    CSN: [id number] Arrival date & time: 03/16/23  1507      History   Chief Complaint Chief Complaint  Patient presents with   Arm Pain         HPI Sandy [name] is a 32 y.o. female presenting for left shoulder, elbow and hand pain after falling onto a parade float that was moving yesterday. Reports bruising and swelling of hand.  Has full range of motion of her shoulder, elbow and wrist but hurts to move these joints.  She has been applying ice but has not taken any meds for her symptoms.  She would like to rule out any fractures.  She denies any other injuries sustained.  HPI  Past Medical History:  Diagnosis Date   Bicornate uterus    Complication of anesthesia    itching after C-Sections   Family history of adverse reaction to anesthesia    uncle has a hard time waking up   Genital HSV    GERD (gastroesophageal reflux disease)    Migraine    Obesity affecting pregnancy    BMI>40    Patient Active Problem List   Diagnosis Date Noted   Hypoglycemia 11/27/2022   Pelvic pain 11/08/2022   History of postpartum hemorrhage 02/20/2022   History of depression 02/20/2022   History of anxiety 02/20/2022   Dehiscence (without extension) of old uterine scar before onset of labor, antepartum condition or complication 01/26/2022   Iron deficiency 08/28/2021   Request for sterilization 08/22/2021   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   H/O ventral hernia repair 08/22/2021   Pregnancy with history of cesarean section, antepartum 03/30/2021   Headache 03/24/2021   Abdominal complaints 03/24/2021   Pregnancy 01/31/2021   Dizziness 12/29/2020   Maternal obesity, antepartum 08/17/2020   ASCUS with positive high risk HPV cervical 08/17/2020   Supervision of high risk pregnancy, antepartum 08/17/2020   History of 3 cesarean sections 08/17/2020   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   CVA  (cerebral vascular accident) (HCC) 10/09/2019   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    Past Surgical History:  Procedure Laterality Date   CESAREAN SECTION  2012   for HSV outbreak   CESAREAN SECTION N/A 11/29/2015   Procedure: CESAREAN SECTION Baby Girl 7lb.[name];  Surgeon: Vena Austria, MD;  Location: [facility];  Service: Obstetrics;  Laterality: N/A;   CESAREAN SECTION N/A 12/18/2016   Procedure: CESAREAN SECTION;  Surgeon: Conard Novak, MD;  Location: [facility];  Service: Obstetrics;  Laterality: N/A;   CESAREAN SECTION N/A 03/30/2021   Procedure: CESAREAN SECTION;  Surgeon: Vena Austria, MD;  Location: [facility];  Service: Obstetrics;  Laterality: N/A;   ears Bilateral    tubes   ESOPHAGOGASTRODUODENOSCOPY (EGD) WITH PROPOFOL N/A 05/04/2016   Procedure: ESOPHAGOGASTRODUODENOSCOPY (EGD) WITH PROPOFOL;  Surgeon: Midge Minium, MD;  Location: [facility];  Service: Endoscopy;  Laterality: N/A;   HERNIA REPAIR  06/18/2019   roux en y  05/2020   STENT PLACEMENT ILIAC ([facility] HX) Right    leg   TONSILLECTOMY      OB History     Gravida  5   Para  4   Term  4   Preterm      AB      Living  4      SAB      IAB  Ectopic      Multiple  0   Live Births  4            Home Medications    Prior to Admission medications   Medication Sig Start Date End Date Taking? Authorizing Provider  acetaminophen (TYLENOL) 500 MG tablet Take 2 tablets (1,000 mg total) by mouth every 6 (six) hours as needed for fever or headache. 04/01/21  Yes Schuman, Jaquelyn Bitter, MD  Blood Glucose Monitoring Suppl (GLUCOCOM BLOOD GLUCOSE MONITOR) DEVI USE AS DIRECTED TO CHECK GLUCOSE 4 TIMES DAILY   Yes [provider]  cholecalciferol (VITAMIN D3) 25 MCG (1000 UNIT) tablet Take 2,000 Units by mouth daily.   Yes [provider]  omeprazole (PRILOSEC) 20 MG capsule Take 1 capsule (20 mg total) by mouth  daily. 01/23/23 07/22/23 Yes Celso Amy, PA-C  ondansetron (ZOFRAN) 4 MG tablet Take 1 tablet (4 mg total) by mouth every 8 (eight) hours as needed for nausea or vomiting. 01/23/23 04/23/23 Yes Celso Amy, PA-C  Prenatal Vit-Fe Fumarate-FA (PRENATAL MULTIVITAMIN) TABS tablet Take 2 tablets by mouth daily at 12 noon. 2 Gummies a day   Yes [provider]  Prenatal Vit-Fe Fumarate-FA (PRENATAL VITAMIN PO)    Yes [provider]  promethazine (PHENERGAN) 12.5 MG tablet Take 1 tablet (12.5 mg total) by mouth every 8 (eight) hours as needed for nausea or vomiting. 12/05/22  Yes Freddy Finner, NP  rizatriptan (MAXALT) 10 MG tablet Take 1 tablet (10 mg total) by mouth as needed for migraine. May repeat in 2 hours if needed 12/05/22  Yes Freddy Finner, NP  sucralfate (CARAFATE) 1 g tablet Take 1 tablet (1 g total) by mouth 3 (three) times daily before meals. 01/23/23 07/22/23 Yes Celso Amy, PA-C    Family History Family History  Problem Relation Age of Onset   Diabetes Maternal Aunt    Hyperlipidemia Maternal Aunt     Social History Social History   Tobacco Use   Smoking status: Former    Current packs/day: 0.00    Types: Cigarettes    Quit date: 04/09/2013    Years since quitting: 9.9   Smokeless tobacco: Never  Vaping Use   Vaping status: Never Used  Substance Use Topics   Alcohol use: Not Currently    Comment: 2x/month, none during pregnacy   Drug use: No     Allergies   Oxycodone-acetaminophen, Silicone, Metoclopramide, Nsaids, Oxycodone hcl, and Fluticasone   Review of Systems Review of Systems  Musculoskeletal:  Positive for arthralgias and joint swelling.  Skin:  Positive for color change. Negative for wound.  Neurological:  Negative for weakness and numbness.     Physical Exam Triage Vital Signs ED Triage Vitals  Encounter Vitals Group     BP      Systolic BP Percentile      Diastolic BP Percentile      Pulse      Resp      Temp       Temp src      SpO2      Weight      Height      Head Circumference      Peak Flow      Pain Score      Pain Loc      Pain Education      Exclude from Growth Chart    No data found.  Updated Vital Signs BP 108/83 (BP Location: Left Arm)  Pulse 63   Temp 98.7 F (37.1 C) (Oral)   Ht 5\' 5"  (1.651 m)   Wt 162 lb (73.5 kg)   LMP 02/28/2023   SpO2 100%   BMI 26.96 kg/m     Physical Exam Vitals and nursing note reviewed.  Constitutional:      General: She is not in acute distress.    Appearance: Normal appearance. She is not ill-appearing or toxic-appearing.  HENT:     Head: Normocephalic and atraumatic.  Eyes:     General: No scleral icterus.       Right eye: No discharge.        Left eye: No discharge.     Conjunctiva/sclera: Conjunctivae normal.  Cardiovascular:     Rate and Rhythm: Normal rate and regular rhythm.  Pulmonary:     Effort: Pulmonary effort is normal. No respiratory distress.  Musculoskeletal:     Left shoulder: Tenderness (TTP proximal humerus, left trapezius, distal 1/3 clavicle) present. No swelling. Normal range of motion. Normal pulse.     Left upper arm: Normal.     Left elbow: No swelling. Normal range of motion (diffusely tender). Tenderness present.     Left forearm: Tenderness (mid forearm) present. No swelling.     Left wrist: Normal.     Left hand: Swelling (index finger and hypothenar region with contusions) and tenderness (entire index finger, thumb and hypothenar region) present. Normal range of motion. Normal pulse.     Cervical back: Neck supple.  Skin:    General: Skin is dry.  Neurological:     General: No focal deficit present.     Mental Status: She is alert. Mental status is at baseline.     Motor: No weakness.     Gait: Gait normal.  Psychiatric:        Mood and Affect: Mood normal.        Behavior: Behavior normal.      UC Treatments / Results  Labs (all labs ordered are listed, but only abnormal results are  displayed) Labs Reviewed - No data to display  EKG   Radiology No results found.  Procedures Procedures (including critical care time)  Medications Ordered in UC Medications - No data to display  Initial Impression / Assessment and Plan / UC Course  I have reviewed the triage vital signs and the nursing notes.  Pertinent labs & imaging results that were available during my care of the patient were reviewed by me and considered in my medical decision making (see chart for details).   32 year old female presents for left shoulder, elbow and hand pain after falling onto a moving float during a parade yesterday.   On exam she has contusions of the index finger and thumb/hypothenar region of the left extremity and tenderness in these areas, tenderness at the mid forearm, diffuse tenderness of the elbow without swelling or bruising and forage motion of elbow.  Tenderness of distal one third of clavicle, proximal humerus and left trapezius.  Full range of motion of shoulder with discomfort.  X-rays obtained of left hand, elbow and shoulder to assess for underlying fracture.    Reviewed all results with patient.  Reviewed use of meds.  Supportive care encouraged increasing rest and fluids and avoiding painful activities.  Reviewed return precautions. *** Final Clinical Impressions(s) / UC Diagnoses   Final diagnoses:  None   Discharge Instructions   None    ED Prescriptions   None    PDMP not reviewed  this encounter.

## 2023-05-07 ENCOUNTER — Telehealth: Payer: Medicaid Other | Admitting: Family Medicine

## 2023-05-07 DIAGNOSIS — J019 Acute sinusitis, unspecified: Secondary | ICD-10-CM | POA: Diagnosis not present

## 2023-05-07 DIAGNOSIS — B9689 Other specified bacterial agents as the cause of diseases classified elsewhere: Secondary | ICD-10-CM | POA: Diagnosis not present

## 2023-05-07 MED ORDER — AMOXICILLIN-POT CLAVULANATE 875-125 MG PO TABS: 1.0000 | ORAL_TABLET | Freq: Two times a day (BID) | ORAL | 0 refills | Status: DC

## 2023-05-07 NOTE — Progress Notes (Signed)

## 2023-05-09 ENCOUNTER — Encounter: Payer: Self-pay | Admitting: Emergency Medicine

## 2023-05-09 ENCOUNTER — Other Ambulatory Visit: Payer: Self-pay

## 2023-05-09 ENCOUNTER — Emergency Department
Admission: EM | Admit: 2023-05-09 | Discharge: 2023-05-09 | Discharge status: Against Medical Advice | Payer: Medicaid Other

## 2023-05-09 ENCOUNTER — Ambulatory Visit
Admission: EM | Admit: 2023-05-09 | Discharge: 2023-05-09 | Disposition: A | Discharge status: Home / Self Care | Payer: Medicaid Other | Attending: Family Medicine | Admitting: Family Medicine

## 2023-05-09 DIAGNOSIS — R519 Headache, unspecified: Secondary | ICD-10-CM

## 2023-05-09 DIAGNOSIS — R079 Chest pain, unspecified: Secondary | ICD-10-CM

## 2023-05-09 DIAGNOSIS — R531 Weakness: Secondary | ICD-10-CM

## 2023-05-09 DIAGNOSIS — M542 Cervicalgia: Secondary | ICD-10-CM | POA: Diagnosis not present

## 2023-05-09 LAB — GLUCOSE, CAPILLARY: Glucose-Capillary: 77 mg/dL (ref 70–99)

## 2023-05-09 NOTE — ED Triage Notes (Signed)
Arrives from [facility] via [facility].  C/O Migraine headache.  126/76  AAOx3.  Skin warm and dry. NAD

## 2023-05-09 NOTE — Discharge Instructions (Signed)
You are being transferred to the hospital by ambulance as you are having the worse headache of your life.

## 2023-05-09 NOTE — ED Triage Notes (Addendum)
Pt c/o cough, headache, chest pain, SOB and dizzy started this morning. Pt states the pain in her chest goes into her left arm and she doesn't have feeling in it.   Pt states this is the worst headache she ever had and her migraine medication is not working.

## 2023-05-09 NOTE — ED Notes (Signed)
EMS called for patient transport.

## 2023-05-09 NOTE — ED Provider Notes (Signed)
[facility]    CSN: [id number] Arrival date & time: 05/09/23  0843      History   Chief Complaint Chief Complaint  Patient presents with   Cough   Headache   Shortness of Breath    HPI Sandy [name] is a 33 y.o. female.   HPI  Asked by RN staff to see patient in triage.   History obtained from the patient. Trust presents for "all-over' headache, chest pain and left arm discomfort that work her up out of her sleep. Head feels like it is "going to explode". She is really cold and dizzy.  She has history of migraines and took her migraine medication 2 hours ago (Maxalt) which typically helps but it didn't this morning. She had never had a headache this bad. "This is the worst headache of my life." She had a coughing spell that caused her to get really dizzy and fall down.    Has neck pain, nausea and light sensitivity.  It hurts to breath in her chest. Feels shortness of breath.  Says she had a cough for the past 2 weeks.  No fever or  diarrhea.  No one is sick at home. She has been able to work and take care of the kids though.   Started taking birth control within the past week.  She takes Diazepam for muscle relaxer.  She has Tramadol for ruptured ovarian cysts.    Past Medical History:  Diagnosis Date   Bicornate uterus    Complication of anesthesia    itching after C-Sections   Family history of adverse reaction to anesthesia    uncle has a hard time waking up   Genital HSV    GERD (gastroesophageal reflux disease)    Migraine    Obesity affecting pregnancy    BMI>40    Patient Active Problem List   Diagnosis Date Noted   Hypoglycemia 11/27/2022   Pelvic pain 11/08/2022   History of postpartum hemorrhage 02/20/2022   History of depression 02/20/2022   History of anxiety 02/20/2022   Dehiscence (without extension) of old uterine scar before onset of labor, antepartum condition or complication 01/26/2022   Iron deficiency 08/28/2021   Request for  sterilization 08/22/2021   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   H/O ventral hernia repair 08/22/2021   Pregnancy with history of cesarean section, antepartum 03/30/2021   Headache 03/24/2021   Abdominal complaints 03/24/2021   Pregnancy 01/31/2021   Dizziness 12/29/2020   Maternal obesity, antepartum 08/17/2020   ASCUS with positive high risk HPV cervical 08/17/2020   Supervision of high risk pregnancy, antepartum 08/17/2020   History of 3 cesarean sections 08/17/2020   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   CVA (cerebral vascular accident) (HCC) 10/09/2019   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    Past Surgical History:  Procedure Laterality Date   CESAREAN SECTION  2012   for HSV outbreak   CESAREAN SECTION N/A 11/29/2015   Procedure: CESAREAN SECTION Baby Girl 7lb.[name];  Surgeon: Vena Austria, MD;  Location: [facility];  Service: Obstetrics;  Laterality: N/A;   CESAREAN SECTION N/A 12/18/2016   Procedure: CESAREAN SECTION;  Surgeon: Conard Novak, MD;  Location: [facility];  Service: Obstetrics;  Laterality: N/A;   CESAREAN SECTION N/A 03/30/2021   Procedure: CESAREAN SECTION;  Surgeon: Vena Austria, MD;  Location: [facility];  Service: Obstetrics;  Laterality: N/A;  ears Bilateral    tubes   ESOPHAGOGASTRODUODENOSCOPY (EGD) WITH PROPOFOL N/A 05/04/2016   Procedure: ESOPHAGOGASTRODUODENOSCOPY (EGD) WITH PROPOFOL;  Surgeon: Midge Minium, MD;  Location: [facility];  Service: Endoscopy;  Laterality: N/A;   HERNIA REPAIR  06/18/2019   roux en y  05/2020   STENT PLACEMENT ILIAC ([facility]) Right    leg   TONSILLECTOMY      OB History     Gravida  5   Para  4   Term  4   Preterm      AB      Living  4      SAB      IAB      Ectopic      Multiple  0   Live Births  4            Home Medications    Prior to Admission  medications   Medication Sig Start Date End Date Taking? Authorizing Provider  diazepam (VALIUM) 5 MG tablet Place vaginally. 05/08/23 07/21/23 Yes [provider]  norethindrone-ethinyl estradiol (LOESTRIN) 1-20 MG-MCG tablet Take by mouth. 05/01/23 04/30/24 Yes [provider]  acetaminophen (TYLENOL) 500 MG tablet Take 2 tablets (1,000 mg total) by mouth every 6 (six) hours as needed for fever or headache. 04/01/21   Schuman, Jaquelyn Bitter, MD  Blood Glucose Monitoring Suppl (GLUCOCOM BLOOD GLUCOSE MONITOR) DEVI USE AS DIRECTED TO CHECK GLUCOSE 4 TIMES DAILY    [provider]  cholecalciferol (VITAMIN D3) 25 MCG (1000 UNIT) tablet Take 2,000 Units by mouth daily.    [provider]  omeprazole (PRILOSEC) 20 MG capsule Take 1 capsule (20 mg total) by mouth daily. 01/23/23 07/22/23  Celso Amy, PA-C  Prenatal Vit-Fe Fumarate-FA (PRENATAL MULTIVITAMIN) TABS tablet Take 2 tablets by mouth daily at 12 noon. 2 Gummies a day    [provider]  Prenatal Vit-Fe Fumarate-FA (PRENATAL VITAMIN PO)     [provider]  promethazine (PHENERGAN) 12.5 MG tablet Take 1 tablet (12.5 mg total) by mouth every 8 (eight) hours as needed for nausea or vomiting. 12/05/22   Freddy Finner, NP  rizatriptan (MAXALT) 10 MG tablet Take 1 tablet (10 mg total) by mouth as needed for migraine. May repeat in 2 hours if needed 12/05/22   Freddy Finner, NP  sucralfate (CARAFATE) 1 g tablet Take 1 tablet (1 g total) by mouth 3 (three) times daily before meals. 01/23/23 07/22/23  Celso Amy, PA-C    Family History Family History  Problem Relation Age of Onset   Diabetes Maternal Aunt    Hyperlipidemia Maternal Aunt     Social History Social History   Tobacco Use   Smoking status: Former    Current packs/day: 0.00    Types: Cigarettes    Quit date: 04/09/2013    Years since quitting: 10.0   Smokeless tobacco: Never  Vaping Use   Vaping status: Never Used   Substance Use Topics   Alcohol use: Not Currently    Comment: 2x/month, none during pregnacy   Drug use: No     Allergies   Oxycodone-acetaminophen, Silicone, Metoclopramide, Nsaids, Oxycodone hcl, and Fluticasone   Review of Systems Review of Systems: negative unless otherwise stated in HPI.      Physical Exam Triage Vital Signs ED Triage Vitals [05/09/23 0848]  Encounter Vitals Group     BP      Systolic BP Percentile      Diastolic BP Percentile  Pulse      Resp      Temp      Temp src      SpO2      Weight      Height      Head Circumference      Peak Flow      Pain Score 10     Pain Loc      Pain Education      Exclude from Growth Chart    No data found.  Updated Vital Signs BP 121/81 (BP Location: Left Arm)   Pulse 79   Temp 98.4 F (36.9 C) (Oral)   Resp 19   LMP 05/01/2023 (Approximate)   SpO2 100%   Visual Acuity Right Eye Distance:   Left Eye Distance:   Bilateral Distance:    Right Eye Near:   Left Eye Near:    Bilateral Near:     Physical Exam GEN:     alert, tearful female laying flat on exam table    HENT:  mucus membranes moist, no nasal discharge, EYES:   pupils equal and reactive, no scleral injection or discharge NECK:  normal ROM, no lymphadenopathy, no meningismus   RESP:  no increased work of breathing, clear to auscultation bilaterally CVS:   regular rate and rhythm NEURO:  alert, oriented, speech normal, no facial droop,  sensation grossly intact, strength testing 3-/5 upper extremities though suspect this was limited by effort, pt able to lift legs 2 inches off the table but not higher  Skin:   warm and dry, no rash on visible skin    UC Treatments / Results  Labs (all labs ordered are listed, but only abnormal results are displayed) Labs Reviewed  GLUCOSE, CAPILLARY  CBG MONITORING, ED    EKG   Radiology No results found.  Procedures Procedures (including critical care time)  Medications Ordered in  UC Medications - No data to display  Initial Impression / Assessment and Plan / UC Course  I have reviewed the triage vital signs and the nursing notes.  Pertinent labs & imaging results that were available during my care of the patient were reviewed by me and considered in my medical decision making (see chart for details).       Pt is a 33 y.o. female who has history of cluster migraines, ovarian cysts, depression and anxiety presents for acute generalized headache and chest pain that radiates to left shoulder that woke her up from sleep.    Differential diagnosis includes but is not limited to migraine headache, tension headache, cluster headache, CVA, depression, intracranial hemorrhage, meningitis/encephalitis, giant cell arteritis, idiopathic intracranial hypertension, dehydration and analgesia abuse. There are no red flag symptoms associated with headache.  On chart review, there is no head imaging available.    On chart review, she had a code stroke called in Aug 2024 for headache and left sided weakness. Head imaging was normal.    Brailee is afebrile here. Satting well on room air. Vital signs stable. Overall pt is tearful with limited movement on the exam table. EKG personally interpreted by me; NSR without acute ST or T wave changes; possible LA enlargement, compared with ED visit on 11/09/22.     Strength testing 3 minus/5, though this may be effort limited. Recommended ED evaluation as I am not able to adequately manage her headache and chest pain here. Unable to rule out intracranial hemorrhage as she is reports worst headache of her life. She doesn't think  she can drive herself to the hospital.  EMS called and updated upon arrival.    Called and spoke with charge RN regarding pt's arrival via EMS for headache and chest pain.         Final Clinical Impressions(s) / UC Diagnoses   Final diagnoses:  Bad headache  Generalized weakness  Neck pain  Chest pain, unspecified  type     Discharge Instructions      You are being transferred to the hospital by ambulance as you are having the worse headache of your life.       ED Prescriptions   None    PDMP not reviewed this encounter.   Katha Cabal, DO 05/09/23 1123

## 2023-05-09 NOTE — ED Notes (Signed)
Patient is being discharged from the Urgent Care and sent to the Emergency Department via EMS . Per Dr. Rachael Darby, patient is in need of higher level of care due to HA, chest pain. Patient is aware and verbalizes understanding of plan of care.  Vitals:   05/09/23 0855  BP: 121/81  Pulse: 79  Resp: 19  Temp: 98.4 F (36.9 C)  SpO2: 100%

## 2023-05-09 NOTE — ED Notes (Signed)
Patient is being discharged from the Urgent Care and sent to the Emergency Department via EMS . Per Dr. Rachael Darby, patient is in need of higher level of care due to Chest Pain. Patient is aware and verbalizes understanding of plan of care.  Vitals:   05/09/23 0855  BP: 121/81  Pulse: 79  Resp: 19  Temp: 98.4 F (36.9 C)  SpO2: 100%

## 2023-05-13 ENCOUNTER — Telehealth: Payer: Medicaid Other | Admitting: Nurse Practitioner

## 2023-05-13 DIAGNOSIS — R0981 Nasal congestion: Secondary | ICD-10-CM | POA: Diagnosis not present

## 2023-05-13 MED ORDER — IPRATROPIUM BROMIDE 0.03 % NA SOLN: 2.0000 | Freq: Two times a day (BID) | NASAL | 12 refills | Status: DC

## 2023-05-13 NOTE — Progress Notes (Signed)
E-Visit for Upper Respiratory Infection   We are sorry you are not feeling well.  Here is how we plan to help!  I would not recommend repeating antibiotics at this point since you were on some in the past week, likely the flu did uptick your sinus congestion and will hopefully resolve as the flu resolves.  Assure you are staying hydrated, using a nasal spray and trying to keep your sinuses open with over the counter medications.   Based on what you have shared with me, it looks like you may have a viral upper respiratory infection.  Upper respiratory infections are caused by a large number of viruses; however, rhinovirus is the most common cause.   Symptoms vary from person to person, with common symptoms including sore throat, cough, fatigue or lack of energy and feeling of general discomfort.  A low-grade fever of up to 100.4 may present, but is often uncommon.  Symptoms vary however, and are closely related to a person's age or underlying illnesses.  The most common symptoms associated with an upper respiratory infection are nasal discharge or congestion, cough, sneezing, headache and pressure in the ears and face.  These symptoms usually persist for about 3 to 10 days, but can last up to 2 weeks.  It is important to know that upper respiratory infections do not cause serious illness or complications in most cases.    Upper respiratory infections can be transmitted from person to person, with the most common method of transmission being a person's hands.  The virus is able to live on the skin and can infect other persons for up to 2 hours after direct contact.  Also, these can be transmitted when someone coughs or sneezes; thus, it is important to cover the mouth to reduce this risk.  To keep the spread of the illness at bay, good hand hygiene is very important.  This is an infection that is most likely caused by a virus. There are no specific treatments other than to help you with the symptoms  until the infection runs its course.  We are sorry you are not feeling well.  Here is how we plan to help!   For nasal congestion, you may use an oral decongestants such as Mucinex D or if you have glaucoma or high blood pressure use plain Mucinex.  Saline nasal spray or nasal drops can help and can safely be used as often as needed for congestion.  For your congestion, I have prescribed Ipratropium Bromide nasal spray 0.03% two sprays in each nostril 2-3 times a day  If you do not have a history of heart disease, hypertension, diabetes or thyroid disease, prostate/bladder issues or glaucoma, you may also use Sudafed to treat nasal congestion.  It is highly recommended that you consult with a pharmacist or your primary care physician to ensure this medication is safe for you to take.     If you have a cough, you may use cough suppressants such as Delsym and Robitussin.  If you have glaucoma or high blood pressure, you can also use Coricidin HBP.     If you have a sore or scratchy throat, use a saltwater gargle-  to  teaspoon of salt dissolved in a 4-ounce to 8-ounce glass of warm water.  Gargle the solution for approximately 15-30 seconds and then spit.  It is important not to swallow the solution.  You can also use throat lozenges/cough drops and Chloraseptic spray to help with throat pain or  discomfort.  Warm or cold liquids can also be helpful in relieving throat pain.  For headache, pain or general discomfort, you can use Ibuprofen or Tylenol as directed.   Some authorities believe that zinc sprays or the use of Echinacea may shorten the course of your symptoms.   HOME CARE Only take medications as instructed by your medical team. Be sure to drink plenty of fluids. Water is fine as well as fruit juices, sodas and electrolyte beverages. You may want to stay away from caffeine or alcohol. If you are nauseated, try taking small sips of liquids. How do you know if you are getting enough fluid?  Your urine should be a pale yellow or almost colorless. Get rest. Taking a steamy shower or using a humidifier may help nasal congestion and ease sore throat pain. You can place a towel over your head and breathe in the steam from hot water coming from a faucet. Using a saline nasal spray works much the same way. Cough drops, hard candies and sore throat lozenges may ease your cough. Avoid close contacts especially the very young and the elderly Cover your mouth if you cough or sneeze Always remember to wash your hands.   GET HELP RIGHT AWAY IF: You develop worsening fever. If your symptoms do not improve within 10 days You develop yellow or green discharge from your nose over 3 days. You have coughing fits You develop a severe head ache or visual changes. You develop shortness of breath, difficulty breathing or start having chest pain Your symptoms persist after you have completed your treatment plan  MAKE SURE YOU  Understand these instructions. Will watch your condition. Will get help right away if you are not doing well or get worse.  Thank you for choosing an e-visit.  Your e-visit answers were reviewed by a board certified advanced clinical practitioner to complete your personal care plan. Depending upon the condition, your plan could have included both over the counter or prescription medications.  Please review your pharmacy choice. Make sure the pharmacy is open so you can pick up prescription now. If there is a problem, you may contact your provider through [employer] and have the prescription routed to another pharmacy.  Your safety is important to [place]. If you have drug allergies check your prescription carefully.   For the next 24 hours you can use MyChart to ask questions about today's visit, request a non-urgent call back, or ask for a work or school excuse. You will get an email in the next two days asking about your experience. I hope that your e-visit has been  valuable and will speed your recovery.  I spent approximately 5 minutes reviewing the patient's history, current symptoms and coordinating their care today.

## 2023-05-22 ENCOUNTER — Telehealth: Payer: Medicaid Other | Admitting: Physician Assistant

## 2023-05-22 DIAGNOSIS — J208 Acute bronchitis due to other specified organisms: Secondary | ICD-10-CM | POA: Diagnosis not present

## 2023-05-22 DIAGNOSIS — B9689 Other specified bacterial agents as the cause of diseases classified elsewhere: Secondary | ICD-10-CM | POA: Diagnosis not present

## 2023-05-22 MED ORDER — PREDNISONE 10 MG (21) PO TBPK: ORAL_TABLET | ORAL | 0 refills | Status: DC

## 2023-05-22 MED ORDER — AZITHROMYCIN 250 MG PO TABS: ORAL_TABLET | ORAL | 0 refills | Status: AC

## 2023-05-22 NOTE — Progress Notes (Signed)
E-Visit for Cough  We are sorry that you are not feeling well.  Here is how we plan to help!  Based on your presentation I believe you most likely have A cough due to bacteria.  When patients have a fever and a productive cough with a change in color or increased sputum production, we are concerned about bacterial bronchitis.  If left untreated it can progress to pneumonia.  If your symptoms do not improve with your treatment plan it is important that you contact your provider.   I have prescribed Azithromyin 250 mg: two tablets now and then one tablet daily for 4 additonal days    In addition you may use A non-prescription cough medication called Mucinex DM: take 2 tablets every 12 hours.  Prednisone 10 mg daily for 6 days (see taper instructions below)  Directions for 6 day taper: Day 1: 2 tablets before breakfast, 1 after both lunch & dinner and 2 at bedtime Day 2: 1 tab before breakfast, 1 after both lunch & dinner and 2 at bedtime Day 3: 1 tab at each meal & 1 at bedtime Day 4: 1 tab at breakfast, 1 at lunch, 1 at bedtime Day 5: 1 tab at breakfast & 1 tab at bedtime Day 6: 1 tab at breakfast  From your responses in the eVisit questionnaire you describe inflammation in the upper respiratory tract which is causing a significant cough.  This is commonly called Bronchitis and has four common causes:   Allergies Viral Infections Acid Reflux Bacterial Infection Allergies, viruses and acid reflux are treated by controlling symptoms or eliminating the cause. An example might be a cough caused by taking certain blood pressure medications. You stop the cough by changing the medication. Another example might be a cough caused by acid reflux. Controlling the reflux helps control the cough.  USE OF BRONCHODILATOR ("RESCUE") INHALERS: There is a risk from using your bronchodilator too frequently.  The risk is that over-reliance on a medication which only relaxes the muscles surrounding the breathing  tubes can reduce the effectiveness of medications prescribed to reduce swelling and congestion of the tubes themselves.  Although you feel brief relief from the bronchodilator inhaler, your asthma may actually be worsening with the tubes becoming more swollen and filled with mucus.  This can delay other crucial treatments, such as oral steroid medications. If you need to use a bronchodilator inhaler daily, several times per day, you should discuss this with your provider.  There are probably better treatments that could be used to keep your asthma under control.     HOME CARE Only take medications as instructed by your medical team. Complete the entire course of an antibiotic. Drink plenty of fluids and get plenty of rest. Avoid close contacts especially the very young and the elderly Cover your mouth if you cough or cough into your sleeve. Always remember to wash your hands A steam or ultrasonic humidifier can help congestion.   GET HELP RIGHT AWAY IF: You develop worsening fever. You become short of breath You cough up blood. Your symptoms persist after you have completed your treatment plan MAKE SURE YOU  Understand these instructions. Will watch your condition. Will get help right away if you are not doing well or get worse.    Thank you for choosing an e-visit.  Your e-visit answers were reviewed by a board certified advanced clinical practitioner to complete your personal care plan. Depending upon the condition, your plan could have included both over  the counter or prescription medications.  Please review your pharmacy choice. Make sure the pharmacy is open so you can pick up prescription now. If there is a problem, you may contact your provider through [employer] and have the prescription routed to another pharmacy.  Your safety is important to [place]. If you have drug allergies check your prescription carefully.   For the next 24 hours you can use MyChart to ask questions  about today's visit, request a non-urgent call back, or ask for a work or school excuse. You will get an email in the next two days asking about your experience. I hope that your e-visit has been valuable and will speed your recovery.  I have spent 5 minutes in review of e-visit questionnaire, review and updating patient chart, medical decision making and response to patient.   Margaretann Loveless, PA-C

## 2023-05-24 ENCOUNTER — Telehealth: Payer: Medicaid Other | Admitting: Physician Assistant

## 2023-05-24 DIAGNOSIS — A6004 Herpesviral vulvovaginitis: Secondary | ICD-10-CM

## 2023-05-24 MED ORDER — VALACYCLOVIR HCL 500 MG PO TABS: 500.0000 mg | ORAL_TABLET | Freq: Two times a day (BID) | ORAL | 0 refills | Status: AC

## 2023-05-24 NOTE — Progress Notes (Signed)
E-Visit for Herpes Simplex  We are sorry that you are not feeling well.  Here is how we plan to help!  Based on what you have shared ith me, it looks like you may be having an outbreak/flare-up of genital herpes.    I have prescribed I have prescribed Valacyclovir 500 mg Take one by mouth twice a day for 3 days.    If you have been prescribed long term medications to be taken on a regular basis, it is important to follow the recommendations and take them as ordered.    Outbreaks usually include blisters and open sores in the genital area. Outbreaks that happen after the first time are usually not as severe and do not last as long. Genital Herpes Simplex is a commonly sexually transmitted viral infection that is found worldwide. Most of these genital infections are caused by one or two herpes simplex viruses that is passed from person to person during vaginal, oral, or anal sex. Sometimes, people do not know they have herpes because they do not have any symptoms.  Please be aware that if you have genital herpes you can be contagious even when you are not having rash or flare-up and you may not have any symptoms, even when you are taking suppressive medicines.  Herpes cannot be cured. The disease usually causes most problems during the first few years. After that, the virus is still there, but it causes few to no symptoms. Even when the virus is active, people with herpes can take medicines to reduce and help prevent symptoms.  Herpes is an infection that can cause blisters and open sores on the genital area. Herpes is caused by a virus that is passed from person to person during vaginal, oral, or anal sex. Sometimes, people do not know they have herpes because they do not have any symptoms. Herpes cannot be cured. The disease usually causes most problems during the first few years. After that, the virus is still there, but it causes few to no symptoms. Even when the virus is active, people with herpes  can take medicines to reduce and help prevent symptoms.  If you have been prescribed medications to be taken on a regular basis, it is important to follow the recommendations and take them as ordered.  Some people with herpes never have any symptoms. But other people can develop symptoms within a few weeks of being infected with the herpes virus   Symptoms usually include blisters in the genital area. In women, this area includes the vagina, buttocks, anus, or thighs. In men, this area includes the penis, scrotum, anus, butt, or thighs. The blisters can become painful open sores, which then crust over as they heal. Sometimes, people can have other symptoms that include:  ?Blisters on the mouth or lips ?Fever, headache, or pain in the joints ?Trouble urinating  Outbreaks might occur every month or more often, or just once or twice a year. Sometimes, people can tell when an outbreak will occur, because they feel itching or pain beforehand. Sometimes they do not know that an outbreak is coming because they have no symptoms. Whatever your pattern is, keep in mind that herpes outbreaks usually become less frequent over time as you get older. Certain things, called "triggers," can make outbreaks more likely to occur. These include stress, sunlight, menstrual periods,or getting sick.  Antiviral therapy can shorten the duration of symptoms and signs in primary infection, which, when untreated, can be associated with significant increase in the  symptoms of the disease.  HOME CARE Use a portable bath (such as a "Sitz bath") where you can sit in warm water for about 20 minutes. Your bathtub could also work. Avoid bubble baths.  Keep the genital area clean and dry and avoid tight clothes.  Take over-the-counter pain medicine such as acetaminophen (brand name: Tylenol) or ibuprofen sample brand names: Advil, Motrin). But avoid aspirin.  Only take medications as instructed by your medical team.  You are  most likely to spread herpes to a sex partner when you have blisters and open sores on your body. But it's also possible to spread herpes to your partner when you do not have any symptoms. That is because herpes can be present on your body without causing any symptoms, like blisters or pain.  Telling your sex partner that you have herpes can be hard. But it can help protect them, since there are ways to lower the risk of spreading the infection.   Using a condom every time you have sex  Not having sex when you have symptoms  Not having oral sex if you have blisters or open sores (in the genital area or around your mouth)  MAKE SURE YOU   Understand these instructions. Do not have sex without using a condom until you have been seen by a doctor and as instructed by the provider If you are not better or improved within 7 days, you MUST have a follow up at your doctor or the health department for evaluation. There are other causes of rashes in the genital region.  Thank you for choosing an e-visit.  Your e-visit answers were reviewed by a board certified advanced clinical practitioner to complete your personal care plan. Depending upon the condition, your plan could have included both over the counter or prescription medications.  Please review your pharmacy choice. Make sure the pharmacy is open so you can pick up prescription now. If there is a problem, you may contact your provider through [employer] and have the prescription routed to another pharmacy.  Your safety is important to [place]. If you have drug allergies check your prescription carefully.   For the next 24 hours you can use MyChart to ask questions about today's visit, request a non-urgent call back, or ask for a work or school excuse. You will get an email in the next two days asking about your experience. I hope that your e-visit has been valuable and will speed your recovery.   I have spent 5 minutes in review of e-visit  questionnaire, review and updating patient chart, medical decision making and response to patient.   Margaretann Loveless, PA-C

## 2023-06-04 ENCOUNTER — Telehealth: Payer: Medicaid Other | Admitting: Physician Assistant

## 2023-06-04 DIAGNOSIS — R3989 Other symptoms and signs involving the genitourinary system: Secondary | ICD-10-CM | POA: Diagnosis not present

## 2023-06-04 MED ORDER — CEPHALEXIN 500 MG PO CAPS: 500.0000 mg | ORAL_CAPSULE | Freq: Two times a day (BID) | ORAL | 0 refills | Status: AC

## 2023-06-04 NOTE — Progress Notes (Signed)
 I have spent 5 minutes in review of e-visit questionnaire, review and updating patient chart, medical decision making and response to patient.   Piedad Climes, PA-C

## 2023-06-04 NOTE — Progress Notes (Signed)

## 2023-06-10 ENCOUNTER — Telehealth: Admitting: Physician Assistant

## 2023-06-10 DIAGNOSIS — A084 Viral intestinal infection, unspecified: Secondary | ICD-10-CM

## 2023-06-10 MED ORDER — PROMETHAZINE HCL 25 MG RE SUPP
25.0000 mg | Freq: Three times a day (TID) | RECTAL | 0 refills | Status: DC | PRN
Start: 2023-06-10 — End: 2023-08-20

## 2023-06-10 NOTE — Progress Notes (Signed)
 E-Visit for Nausea and Vomiting   We are sorry that you are not feeling well. Here is how we plan to help!  Based on what you have shared with me it looks like you have a Virus that is irritating your GI tract.  Vomiting is the forceful emptying of a portion of the stomach's content through the mouth.  Although nausea and vomiting can make you feel miserable, it's important to remember that these are not diseases, but rather symptoms of an underlying illness.  When we treat short term symptoms, we always caution that any symptoms that persist should be fully evaluated in a medical office.  I have prescribed a medication that will help alleviate your symptoms and allow you to stay hydrated:  Promethazine 25 mg Insert suppository rectally every 8 hours as needed for nausea and vomiting (May cause drowsiness)  HOME CARE: Drink clear liquids.  This is very important! Dehydration (the lack of fluid) can lead to a serious complication.  Start off with 1 tablespoon every 5 minutes for 8 hours. You may begin eating bland foods after 8 hours without vomiting.  Start with saltine crackers, white bread, rice, mashed potatoes, applesauce. After 48 hours on a bland diet, you may resume a normal diet. Try to go to sleep.  Sleep often empties the stomach and relieves the need to vomit.  GET HELP RIGHT AWAY IF:  Your symptoms do not improve or worsen within 2 days after treatment. You have a fever for over 3 days. You cannot keep down fluids after trying the medication.  MAKE SURE YOU:  Understand these instructions. Will watch your condition. Will get help right away if you are not doing well or get worse.    Thank you for choosing an e-visit.  Your e-visit answers were reviewed by a board certified advanced clinical practitioner to complete your personal care plan. Depending upon the condition, your plan could have included both over the counter or prescription medications.  Please review your  pharmacy choice. Make sure the pharmacy is open so you can pick up prescription now. If there is a problem, you may contact your provider through [employer] and have the prescription routed to another pharmacy.  Your safety is important to [place]. If you have drug allergies check your prescription carefully.   For the next 24 hours you can use MyChart to ask questions about today's visit, request a non-urgent call back, or ask for a work or school excuse. You will get an email in the next two days asking about your experience. I hope that your e-visit has been valuable and will speed your recovery.    I have spent 5 minutes in review of e-visit questionnaire, review and updating patient chart, medical decision making and response to patient.   Margaretann Loveless, PA-C

## 2023-06-14 ENCOUNTER — Telehealth: Admitting: Physician Assistant

## 2023-06-14 DIAGNOSIS — K0889 Other specified disorders of teeth and supporting structures: Secondary | ICD-10-CM

## 2023-06-14 MED ORDER — AMOXICILLIN-POT CLAVULANATE 875-125 MG PO TABS: 1.0000 | ORAL_TABLET | Freq: Two times a day (BID) | ORAL | 0 refills | Status: DC

## 2023-06-14 NOTE — Progress Notes (Signed)
 E-Visit for Dental Pain  We are sorry that you are not feeling well.  Here is how we plan to help!  Based on what you have shared with me in the questionnaire, it sounds like you have broken filling and possible infection.   Augmentin 875-125mg  twice a day for 7 days  Recommend you also continue with Tylenol.   It is imperative that you see a dentist within 10 days of this eVisit to determine the cause of the dental pain and be sure it is adequately treated  A toothache or tooth pain is caused when the nerve in the root of a tooth or surrounding a tooth is irritated. Dental (tooth) infection, decay, injury, or loss of a tooth are the most common causes of dental pain. Pain may also occur after an extraction (tooth is pulled out). Pain sometimes originates from other areas and radiates to the jaw, thus appearing to be tooth pain.Bacteria growing inside your mouth can contribute to gum disease and dental decay, both of which can cause pain. A toothache occurs from inflammation of the central portion of the tooth called pulp. The pulp contains nerve endings that are very sensitive to pain. Inflammation to the pulp or pulpitis may be caused by dental cavities, trauma, and infection.    HOME CARE:   For toothaches: Over-the-counter pain medications such as acetaminophen or ibuprofen may be used. Take these as directed on the package while you arrange for a dental appointment. Avoid very cold or hot foods, because they may make the pain worse. You may get relief from biting on a cotton ball soaked in oil of cloves. You can get oil of cloves at most drug stores.  For jaw pain:  Aspirin may be helpful for problems in the joint of the jaw in adults. If pain happens every time you open your mouth widely, the temporomandibular joint (TMJ) may be the source of the pain. Yawning or taking a large bite of food may worsen the pain. An appointment with your doctor or dentist will help you find the cause.      GET HELP RIGHT AWAY IF:  You have a high fever or chills If you have had a recent head or face injury and develop headache, light headedness, nausea, vomiting, or other symptoms that concern you after an injury to your face or mouth, you could have a more serious injury in addition to your dental injury. A facial rash associated with a toothache: This condition may improve with medication. Contact your doctor for them to decide what is appropriate. Any jaw pain occurring with chest pain: Although jaw pain is most commonly caused by dental disease, it is sometimes referred pain from other areas. People with heart disease, especially people who have had stents placed, people with diabetes, or those who have had heart surgery may have jaw pain as a symptom of heart attack or angina. If your jaw or tooth pain is associated with lightheadedness, sweating, or shortness of breath, you should see a doctor as soon as possible. Trouble swallowing or excessive pain or bleeding from gums: If you have a history of a weakened immune system, diabetes, or steroid use, you may be more susceptible to infections. Infections can often be more severe and extensive or caused by unusual organisms. Dental and gum infections in people with these conditions may require more aggressive treatment. An abscess may need draining or IV antibiotics, for example.  MAKE SURE YOU   Understand these instructions. Will  watch your condition. Will get help right away if you are not doing well or get worse.  Thank you for choosing an e-visit.  Your e-visit answers were reviewed by a board certified advanced clinical practitioner to complete your personal care plan. Depending upon the condition, your plan could have included both over the counter or prescription medications.  Please review your pharmacy choice. Make sure the pharmacy is open so you can pick up prescription now. If there is a problem, you may contact your provider  through [employer] and have the prescription routed to another pharmacy.  Your safety is important to [place]. If you have drug allergies check your prescription carefully.   For the next 24 hours you can use MyChart to ask questions about today's visit, request a non-urgent call back, or ask for a work or school excuse. You will get an email in the next two days asking about your experience. I hope that your e-visit has been valuable and will speed your recovery.   I have spent 5 minutes in review of e-visit questionnaire, review and updating patient chart, medical decision making and response to patient.   Tylene Fantasia Ward, PA-C

## 2023-06-26 ENCOUNTER — Telehealth: Admitting: Family Medicine

## 2023-06-26 DIAGNOSIS — M545 Low back pain, unspecified: Secondary | ICD-10-CM

## 2023-06-26 MED ORDER — CYCLOBENZAPRINE HCL 10 MG PO TABS: 10.0000 mg | ORAL_TABLET | Freq: Three times a day (TID) | ORAL | 0 refills | Status: DC | PRN

## 2023-06-26 NOTE — Progress Notes (Signed)

## 2023-07-01 ENCOUNTER — Encounter

## 2023-07-10 ENCOUNTER — Telehealth: Admitting: Physician Assistant

## 2023-07-10 DIAGNOSIS — K911 Postgastric surgery syndromes: Secondary | ICD-10-CM | POA: Diagnosis not present

## 2023-07-10 DIAGNOSIS — R110 Nausea: Secondary | ICD-10-CM

## 2023-07-10 MED ORDER — ONDANSETRON 4 MG PO TBDP: 4.0000 mg | ORAL_TABLET | Freq: Three times a day (TID) | ORAL | 0 refills | Status: DC | PRN

## 2023-07-10 NOTE — Progress Notes (Signed)

## 2023-08-20 ENCOUNTER — Telehealth: Admitting: Physician Assistant

## 2023-08-20 DIAGNOSIS — R110 Nausea: Secondary | ICD-10-CM | POA: Diagnosis not present

## 2023-08-20 MED ORDER — ONDANSETRON 4 MG PO TBDP
4.0000 mg | ORAL_TABLET | Freq: Three times a day (TID) | ORAL | 0 refills | Status: DC | PRN
Start: 2023-08-20 — End: 2023-10-15

## 2023-08-20 NOTE — Progress Notes (Signed)

## 2023-08-26 ENCOUNTER — Telehealth: Admitting: Physician Assistant

## 2023-08-26 DIAGNOSIS — K047 Periapical abscess without sinus: Secondary | ICD-10-CM

## 2023-08-26 MED ORDER — AMOXICILLIN-POT CLAVULANATE 875-125 MG PO TABS: 1.0000 | ORAL_TABLET | Freq: Two times a day (BID) | ORAL | 0 refills | Status: DC

## 2023-08-26 NOTE — Progress Notes (Signed)

## 2023-09-16 ENCOUNTER — Telehealth: Admitting: Physician Assistant

## 2023-09-16 DIAGNOSIS — A6004 Herpesviral vulvovaginitis: Secondary | ICD-10-CM | POA: Diagnosis not present

## 2023-09-17 ENCOUNTER — Ambulatory Visit: Admitting: Nurse Practitioner

## 2023-09-17 MED ORDER — VALACYCLOVIR HCL 500 MG PO TABS: 500.0000 mg | ORAL_TABLET | Freq: Two times a day (BID) | ORAL | 0 refills | Status: DC

## 2023-09-17 NOTE — Progress Notes (Signed)
 E-Visit for Herpes Simplex  We are sorry that you are not feeling well.  Here is how we plan to help!  Based on what you have shared ith me, it looks like you may be having an outbreak/flare-up of genital herpes.    I have prescribed I have prescribed Valacyclovir 500 mg Take one by mouth twice a day for 3 days.    If you have been prescribed long term medications to be taken on a regular basis, it is important to follow the recommendations and take them as ordered.    Outbreaks usually include blisters and open sores in the genital area. Outbreaks that happen after the first time are usually not as severe and do not last as long. Genital Herpes Simplex is a commonly sexually transmitted viral infection that is found worldwide. Most of these genital infections are caused by one or two herpes simplex viruses that is passed from person to person during vaginal, oral, or anal sex. Sometimes, people do not know they have herpes because they do not have any symptoms.  Please be aware that if you have genital herpes you can be contagious even when you are not having rash or flare-up and you may not have any symptoms, even when you are taking suppressive medicines.  Herpes cannot be cured. The disease usually causes most problems during the first few years. After that, the virus is still there, but it causes few to no symptoms. Even when the virus is active, people with herpes can take medicines to reduce and help prevent symptoms.  Herpes is an infection that can cause blisters and open sores on the genital area. Herpes is caused by a virus that is passed from person to person during vaginal, oral, or anal sex. Sometimes, people do not know they have herpes because they do not have any symptoms. Herpes cannot be cured. The disease usually causes most problems during the first few years. After that, the virus is still there, but it causes few to no symptoms. Even when the virus is active, people with herpes  can take medicines to reduce and help prevent symptoms.  If you have been prescribed medications to be taken on a regular basis, it is important to follow the recommendations and take them as ordered.  Some people with herpes never have any symptoms. But other people can develop symptoms within a few weeks of being infected with the herpes virus   Symptoms usually include blisters in the genital area. In women, this area includes the vagina, buttocks, anus, or thighs. In men, this area includes the penis, scrotum, anus, butt, or thighs. The blisters can become painful open sores, which then crust over as they heal. Sometimes, people can have other symptoms that include:  ?Blisters on the mouth or lips ?Fever, headache, or pain in the joints ?Trouble urinating  Outbreaks might occur every month or more often, or just once or twice a year. Sometimes, people can tell when an outbreak will occur, because they feel itching or pain beforehand. Sometimes they do not know that an outbreak is coming because they have no symptoms. Whatever your pattern is, keep in mind that herpes outbreaks usually become less frequent over time as you get older. Certain things, called "triggers," can make outbreaks more likely to occur. These include stress, sunlight, menstrual periods,or getting sick.  Antiviral therapy can shorten the duration of symptoms and signs in primary infection, which, when untreated, can be associated with significant increase in the  symptoms of the disease.  HOME CARE Use a portable bath (such as a "Sitz bath") where you can sit in warm water for about 20 minutes. Your bathtub could also work. Avoid bubble baths.  Keep the genital area clean and dry and avoid tight clothes.  Take over-the-counter pain medicine such as acetaminophen (brand name: Tylenol) or ibuprofen sample brand names: Advil, Motrin). But avoid aspirin.  Only take medications as instructed by your medical team.  You are  most likely to spread herpes to a sex partner when you have blisters and open sores on your body. But it's also possible to spread herpes to your partner when you do not have any symptoms. That is because herpes can be present on your body without causing any symptoms, like blisters or pain.  Telling your sex partner that you have herpes can be hard. But it can help protect them, since there are ways to lower the risk of spreading the infection.   Using a condom every time you have sex  Not having sex when you have symptoms  Not having oral sex if you have blisters or open sores (in the genital area or around your mouth)  MAKE SURE YOU   Understand these instructions. Do not have sex without using a condom until you have been seen by a doctor and as instructed by the provider If you are not better or improved within 7 days, you MUST have a follow up at your doctor or the health department for evaluation. There are other causes of rashes in the genital region.  Thank you for choosing an e-visit.  Your e-visit answers were reviewed by a board certified advanced clinical practitioner to complete your personal care plan. Depending upon the condition, your plan could have included both over the counter or prescription medications.  Please review your pharmacy choice. Make sure the pharmacy is open so you can pick up prescription now. If there is a problem, you may contact your provider through [employer] and have the prescription routed to another pharmacy.  Your safety is important to [place]. If you have drug allergies check your prescription carefully.   For the next 24 hours you can use MyChart to ask questions about today's visit, request a non-urgent call back, or ask for a work or school excuse. You will get an email in the next two days asking about your experience. I hope that your e-visit has been valuable and will speed your recovery.

## 2023-09-17 NOTE — Progress Notes (Signed)
 I have spent 5 minutes in review of e-visit questionnaire, review and updating patient chart, medical decision making and response to patient.   Piedad Climes, PA-C

## 2023-09-23 ENCOUNTER — Telehealth: Admitting: Family Medicine

## 2023-09-23 DIAGNOSIS — M5441 Lumbago with sciatica, right side: Secondary | ICD-10-CM

## 2023-09-23 NOTE — Progress Notes (Signed)
  Because you had a fall that has produced a lot of pain. We feel your condition warrants further evaluation and we recommend that you be seen in a face-to-face visit at the local urgent care.    NOTE: There will be NO CHARGE for this E-Visit   If you are having a true medical emergency, please call 911.

## 2023-10-07 ENCOUNTER — Telehealth: Admitting: Physician Assistant

## 2023-10-07 DIAGNOSIS — M545 Low back pain, unspecified: Secondary | ICD-10-CM

## 2023-10-07 NOTE — Progress Notes (Signed)
 Because you are experiencing severe 10/10 pain and normally receive a controlled medication for your pain, I feel your condition warrants further evaluation and I recommend that you be seen in a face-to-face visit. We cannot prescribe any narcotics or controlled substances within e-Visits, or any virtual urgent care appointment.   NOTE: There will be NO CHARGE for this E-Visit   If you are having a true medical emergency, please call 911.     For an urgent face to face visit, [facility] has multiple urgent care centers for your convenience.  Click the link below for the full list of locations and hours, walk-in wait times, appointment scheduling options and driving directions:  Urgent Care - [place], [place], [place], [place], [place], [place]  [place]     Your MyChart E-visit questionnaire answers were reviewed by a board certified advanced clinical practitioner to complete your personal care plan based on your specific symptoms.    Thank you for using e-Visits.     I have spent 5 minutes in review of e-visit questionnaire, review and updating patient chart, medical decision making and response to patient.   Delon CHRISTELLA Dickinson, PA-C

## 2023-10-14 ENCOUNTER — Telehealth: Admitting: Physician Assistant

## 2023-10-14 DIAGNOSIS — A084 Viral intestinal infection, unspecified: Secondary | ICD-10-CM | POA: Diagnosis not present

## 2023-10-15 ENCOUNTER — Telehealth: Admitting: Physician Assistant

## 2023-10-15 DIAGNOSIS — A084 Viral intestinal infection, unspecified: Secondary | ICD-10-CM

## 2023-10-15 MED ORDER — ONDANSETRON 4 MG PO TBDP: 4.0000 mg | ORAL_TABLET | Freq: Three times a day (TID) | ORAL | 0 refills | Status: DC | PRN

## 2023-10-15 NOTE — Progress Notes (Signed)
E-Visit for Nausea and Vomiting   We are sorry that you are not feeling well. Here is how we plan to help!  Based on what you have shared with me it looks like you have a Virus that is irritating your GI tract.  Vomiting is the forceful emptying of a portion of the stomach's content through the mouth.  Although nausea and vomiting can make you feel miserable, it's important to remember that these are not diseases, but rather symptoms of an underlying illness.  When we treat short term symptoms, we always caution that any symptoms that persist should be fully evaluated in a medical office.  I have prescribed a medication that will help alleviate your symptoms and allow you to stay hydrated:  Zofran 4 mg 1 tablet every 8 hours as needed for nausea and vomiting  HOME CARE: Drink clear liquids.  This is very important! Dehydration (the lack of fluid) can lead to a serious complication.  Start off with 1 tablespoon every 5 minutes for 8 hours. You may begin eating bland foods after 8 hours without vomiting.  Start with saltine crackers, white bread, rice, mashed potatoes, applesauce. After 48 hours on a bland diet, you may resume a normal diet. Try to go to sleep.  Sleep often empties the stomach and relieves the need to vomit.  GET HELP RIGHT AWAY IF:  Your symptoms do not improve or worsen within 2 days after treatment. You have a fever for over 3 days. You cannot keep down fluids after trying the medication.  MAKE SURE YOU:  Understand these instructions. Will watch your condition. Will get help right away if you are not doing well or get worse.    Thank you for choosing an e-visit.  Your e-visit answers were reviewed by a board certified advanced clinical practitioner to complete your personal care plan. Depending upon the condition, your plan could have included both over the counter or prescription medications.  Please review your pharmacy choice. Make sure the pharmacy is open so  you can pick up prescription now. If there is a problem, you may contact your provider through MyChart messaging and have the prescription routed to another pharmacy.  Your safety is important to us. If you have drug allergies check your prescription carefully.   For the next 24 hours you can use MyChart to ask questions about today's visit, request a non-urgent call back, or ask for a work or school excuse. You will get an email in the next two days asking about your experience. I hope that your e-visit has been valuable and will speed your recovery.  

## 2023-10-15 NOTE — Progress Notes (Signed)
 I have spent 5 minutes in review of e-visit questionnaire, review and updating patient chart, medical decision making and response to patient.   Piedad Climes, PA-C

## 2023-10-16 MED ORDER — PROMETHAZINE HCL 25 MG RE SUPP
25.0000 mg | Freq: Three times a day (TID) | RECTAL | 0 refills | Status: DC | PRN
Start: 2023-10-16 — End: 2023-11-12

## 2023-10-16 NOTE — Progress Notes (Signed)

## 2023-11-06 ENCOUNTER — Institutional Professional Consult (permissible substitution): Admitting: Plastic Surgery

## 2023-11-12 ENCOUNTER — Telehealth: Admitting: Physician Assistant

## 2023-11-12 DIAGNOSIS — G8929 Other chronic pain: Secondary | ICD-10-CM

## 2023-11-12 DIAGNOSIS — M545 Low back pain, unspecified: Secondary | ICD-10-CM

## 2023-11-12 MED ORDER — CYCLOBENZAPRINE HCL 10 MG PO TABS: 5.0000 mg | ORAL_TABLET | Freq: Three times a day (TID) | ORAL | 0 refills | Status: DC | PRN

## 2023-11-12 MED ORDER — METHYLPREDNISOLONE 4 MG PO TBPK
ORAL_TABLET | ORAL | 0 refills | Status: DC
Start: 2023-11-12 — End: 2023-12-04

## 2023-11-12 NOTE — Progress Notes (Signed)
 E-Visit for Back Pain   We are sorry that you are not feeling well.  Here is how we plan to help!  Based on what you have shared with me it looks like you mostly have acute back pain.  Acute back pain is defined as musculoskeletal pain that can resolve in 1-3 weeks with conservative treatment.  I have prescribed Medrol  dosse pack, a steroid anti-inflammatory, as well as Flexeril  10 mg every eight hours as needed which is a muscle relaxer.  Some patients experience stomach irritation or in increased heartburn with anti-inflammatory drugs.  Please keep in mind that muscle relaxer's can cause fatigue and should not be taken while at work or driving.  Back pain is very common.  The pain often gets better over time.  The cause of back pain is usually not dangerous.  Most people can learn to manage their back pain on their own.  Home Care Stay active.  Start with short walks on flat ground if you can.  Try to walk farther each day. Do not sit, drive or stand in one place for more than 30 minutes.  Do not stay in bed. Do not avoid exercise or work.  Activity can help your back heal faster. Be careful when you bend or lift an object.  Bend at your knees, keep the object close to you, and do not twist. Sleep on a firm mattress.  Lie on your side, and bend your knees.  If you lie on your back, put a pillow under your knees. Only take medicines as told by your doctor. Put ice on the injured area. Put ice in a plastic bag Place a towel between your skin and the bag Leave the ice on for 15-20 minutes, 3-4 times a day for the first 2-3 days. 210 After that, you can switch between ice and heat packs. Ask your doctor about back exercises or massage. Avoid feeling anxious or stressed.  Find good ways to deal with stress, such as exercise.  Get Help Right Way If: Your pain does not go away with rest or medicine. Your pain does not go away in 1 week. You have new problems. You do not feel well. The pain  spreads into your legs. You cannot control when you poop (bowel movement) or pee (urinate) You feel sick to your stomach (nauseous) or throw up (vomit) You have belly (abdominal) pain. You feel like you may pass out (faint). If you develop a fever.  Make Sure you: Understand these instructions. Will watch your condition Will get help right away if you are not doing well or get worse.  Your e-visit answers were reviewed by a board certified advanced clinical practitioner to complete your personal care plan.  Depending on the condition, your plan could have included both over the counter or prescription medications.  If there is a problem please reply  once you have received a response from your provider.  Your safety is important to us .  If you have drug allergies check your prescription carefully.    You can use MyChart to ask questions about today's visit, request a non-urgent call back, or ask for a work or school excuse for 24 hours related to this e-Visit. If it has been greater than 24 hours you will need to follow up with your provider, or enter a new e-Visit to address those concerns.  You will get an e-mail in the next two days asking about your experience.  I hope that your  e-visit has been valuable and will speed your recovery. Thank you for using e-visits.    I have spent 5 minutes in review of e-visit questionnaire, review and updating patient chart, medical decision making and response to patient.   Delon CHRISTELLA Dickinson, PA-C

## 2023-11-24 ENCOUNTER — Telehealth: Admitting: Family Medicine

## 2023-11-24 DIAGNOSIS — R112 Nausea with vomiting, unspecified: Secondary | ICD-10-CM | POA: Diagnosis not present

## 2023-11-24 MED ORDER — ONDANSETRON HCL 4 MG PO TABS: 4.0000 mg | ORAL_TABLET | Freq: Three times a day (TID) | ORAL | 0 refills | Status: AC | PRN

## 2023-11-24 NOTE — Progress Notes (Signed)
 E-Visit for Nausea and Vomiting   We are sorry that you are not feeling well. Here is how we plan to help!  Based on what you have shared with me it looks like you have a Virus that is irritating your GI tract.  Vomiting is the forceful emptying of a portion of the stomach's content through the mouth.  Although nausea and vomiting can make you feel miserable, it's important to remember that these are not diseases, but rather symptoms of an underlying illness.  When we treat short term symptoms, we always caution that any symptoms that persist should be fully evaluated in a medical office.  I have prescribed a medication that will help alleviate your symptoms and allow you to stay hydrated:  Zofran 4 mg 1 tablet every 8 hours as needed for nausea and vomiting  HOME CARE: Drink clear liquids.  This is very important! Dehydration (the lack of fluid) can lead to a serious complication.  Start off with 1 tablespoon every 5 minutes for 8 hours. You may begin eating bland foods after 8 hours without vomiting.  Start with saltine crackers, white bread, rice, mashed potatoes, applesauce. After 48 hours on a bland diet, you may resume a normal diet. Try to go to sleep.  Sleep often empties the stomach and relieves the need to vomit.  GET HELP RIGHT AWAY IF:  Your symptoms do not improve or worsen within 2 days after treatment. You have a fever for over 3 days. You cannot keep down fluids after trying the medication.  MAKE SURE YOU:  Understand these instructions. Will watch your condition. Will get help right away if you are not doing well or get worse.    Thank you for choosing an e-visit.  Your e-visit answers were reviewed by a board certified advanced clinical practitioner to complete your personal care plan. Depending upon the condition, your plan could have included both over the counter or prescription medications.  Please review your pharmacy choice. Make sure the pharmacy is open so  you can pick up prescription now. If there is a problem, you may contact your provider through [employer] and have the prescription routed to another pharmacy.  Your safety is important to [place]. If you have drug allergies check your prescription carefully.   For the next 24 hours you can use MyChart to ask questions about today's visit, request a non-urgent call back, or ask for a work or school excuse. You will get an email in the next two days asking about your experience. I hope that your e-visit has been valuable and will speed your recovery.    have provided 5 minutes of non face to face time during this encounter for chart review and documentation.

## 2023-11-24 NOTE — Addendum Note (Signed)
 Addended by: ALMEDA DEGREE on: 11/24/2023 12:40 PM   Modules accepted: Orders

## 2023-12-01 ENCOUNTER — Telehealth: Admitting: Physician Assistant

## 2023-12-01 DIAGNOSIS — R519 Headache, unspecified: Secondary | ICD-10-CM

## 2023-12-01 NOTE — Progress Notes (Signed)
 Virtual Visit Consent   Sandy [name], you are scheduled for a virtual visit with a [facility] provider today. Just as with appointments in the office, your consent must be obtained to participate. Your consent will be active for this visit and any virtual visit you may have with one of our providers in the next 365 days. If you have a MyChart account, a copy of this consent can be sent to you electronically.  As this is a virtual visit, video technology does not allow for your provider to perform a traditional examination. This may limit your provider's ability to fully assess your condition. If your provider identifies any concerns that need to be evaluated in person or the need to arrange testing (such as labs, EKG, etc.), we will make arrangements to do so. Although advances in technology are sophisticated, we cannot ensure that it will always work on either your end or our end. If the connection with a video visit is poor, the visit may have to be switched to a telephone visit. With either a video or telephone visit, we are not always able to ensure that we have a secure connection.  By engaging in this virtual visit, you consent to the provision of healthcare and authorize for your insurance to be billed (if applicable) for the services provided during this visit. Depending on your insurance coverage, you may receive a charge related to this service.  I need to obtain your verbal consent now. Are you willing to proceed with your visit today? Sandy [name] has provided verbal consent on 12/01/2023 for a virtual visit (video or telephone). Sandy [name], [place]  Date: 12/01/2023 2:32 PM   Virtual Visit via Video Note   I, Sandy [name], connected with  Sandy [name]  ([id number], 1991-04-06) on 12/01/23 at  2:30 PM EDT by a video-enabled telemedicine application and verified that I am speaking with the correct person using two identifiers.  Location: Patient: Virtual Visit Location Patient:  Home Provider: Virtual Visit Location Provider: Home Office   I discussed the limitations of evaluation and management by telemedicine and the availability of in person appointments. The patient expressed understanding and agreed to proceed.    History of Present Illness: Sandy [name] is a 33 y.o. who identifies as a female who was assigned female at birth, and is being seen today for headache.  HPI: Migraine  This is a new problem. The current episode started today. The problem has been unchanged. The pain is located in the Bilateral region. The quality of the pain is described as aching and dull. The pain is at a severity of 8/10. Associated symptoms include nausea and vomiting. She has tried acetaminophen  and cold packs for the symptoms. The treatment provided no relief. Her past medical history is significant for migraine headaches.    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   H/O ventral hernia repair 08/22/2021   Pregnancy with history of cesarean section, antepartum 03/30/2021   Headache 03/24/2021   Abdominal complaints 03/24/2021   Pregnancy 01/31/2021   Dizziness 12/29/2020   Maternal  obesity, antepartum 08/17/2020   ASCUS with positive high risk HPV cervical 08/17/2020   Supervision of high risk pregnancy, antepartum 08/17/2020   History of 3 cesarean sections 08/17/2020   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   CVA (cerebral vascular accident) (HCC) 10/09/2019   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    Allergies:  Allergies  Allergen Reactions   Oxycodone -Acetaminophen  Hives, Itching and Other (See Comments)    Okay to take tramadol , hydromorphone    Silicone Hives and Other (See Comments)    Eczema break out  OK to use tegaderm/sorbaview for dressing per patient 11/17/21  Eczema break out    Eczema break out OK to use tegaderm/sorbaview for dressing per patient 11/17/21   Metoclopramide  Other (See Comments)   Nsaids     Gastric Bypass    Oxycodone  Hcl    Fluticasone Swelling    Flonase  Mouth swelling   Medications:  Current Outpatient Medications:    cyclobenzaprine  (FLEXERIL ) 10 MG tablet, Take 0.5-1 tablets (5-10 mg total) by mouth 3 (three) times daily as needed., Disp: 30 tablet, Rfl: 0   methylPREDNISolone  (MEDROL  DOSEPAK) 4 MG TBPK tablet, 6 day taper; take as directed on package instructions, Disp: 21 tablet, Rfl: 0   omeprazole  (PRILOSEC) 20 MG capsule, Take 1 capsule (20 mg total) by mouth daily., Disp: 90 capsule, Rfl: 1   ondansetron  (ZOFRAN ) 4 MG tablet, Take 1 tablet (4 mg total) by mouth every 8 (eight) hours as needed for nausea or vomiting., Disp: 20 tablet, Rfl: 0  Observations/Objective: Patient is well-developed, well-nourished in no acute distress.  Resting comfortably  at home.  Head is normocephalic, atraumatic.  No labored breathing.  Speech is clear and coherent with logical content.  Patient is alert and oriented at baseline.    Assessment and Plan: 1. Severe headache (Primary)  Severe headache with nausea, vomiting and dizziness. Not relieved with tylenol . Patient states she cannot take NSAID'S. For this reason, I advised in person evaluation to rule out any other causes of acute headache given her contributory symptoms associated with her headache. She agreed to this plan and to present in person for evaluation.   Follow Up Instructions: I discussed the assessment and treatment plan with the  patient. The patient was provided an opportunity to ask questions and all were answered. The patient agreed with the plan and demonstrated an understanding of the instructions.  A copy of instructions were sent to the patient via MyChart unless otherwise noted below.    The patient was advised to call back or seek an in-person evaluation if the symptoms worsen or if the condition fails to improve as anticipated.    Sandy Shuck, PA-C

## 2023-12-01 NOTE — Patient Instructions (Signed)
  Sandy [name], thank you for joining Teena Shuck, PA-C for today's virtual visit.  While this provider is not your primary care provider (PCP), if your PCP is located in our provider database this encounter information will be shared with them immediately following your visit.   A [facility] MyChart account gives you access to today's visit and all your visits, tests, and labs performed at [facility]  click here if you don't have a [facility] MyChart account or go to [email or web address]  Consent: (Patient) Sandy [name] provided verbal consent for this virtual visit at the beginning of the encounter.  Current Medications:  Current Outpatient Medications:    cyclobenzaprine  (FLEXERIL ) 10 MG tablet, Take 0.5-1 tablets (5-10 mg total) by mouth 3 (three) times daily as needed., Disp: 30 tablet, Rfl: 0   methylPREDNISolone  (MEDROL  DOSEPAK) 4 MG TBPK tablet, 6 day taper; take as directed on package instructions, Disp: 21 tablet, Rfl: 0   omeprazole  (PRILOSEC) 20 MG capsule, Take 1 capsule (20 mg total) by mouth daily., Disp: 90 capsule, Rfl: 1   ondansetron  (ZOFRAN ) 4 MG tablet, Take 1 tablet (4 mg total) by mouth every 8 (eight) hours as needed for nausea or vomiting., Disp: 20 tablet, Rfl: 0   Medications ordered in this encounter:  No orders of the defined types were placed in this encounter.    *If you need refills on other medications prior to your next appointment, please contact your pharmacy*  Follow-Up: Call back or seek an in-person evaluation if the symptoms worsen or if the condition fails to improve as anticipated.  [facility] [phone]  Other Instructions Report to Urgent Care for in person evaluation.    If you have been instructed to have an in-person evaluation today at a local Urgent Care facility, please use the link below. It will take you to a list of all of our available [facility], including address, phone  number and hours of operation. Please do not delay care.  [facility]  If you or a family member do not have a primary care provider, use the link below to schedule a visit and establish care. When you choose a [facility] primary care physician or advanced practice provider, you gain a long-term partner in health. Find a Primary Care Provider  Learn more about [facility]'s in-office and virtual care options: [facility] - Get Care Now

## 2023-12-04 ENCOUNTER — Ambulatory Visit

## 2023-12-04 VITALS — BP 103/62 | HR 93 | Ht 65.5 in | Wt 161.6 lb

## 2023-12-04 DIAGNOSIS — E65 Localized adiposity: Secondary | ICD-10-CM | POA: Diagnosis not present

## 2023-12-04 DIAGNOSIS — Z8719 Personal history of other diseases of the digestive system: Secondary | ICD-10-CM

## 2023-12-04 DIAGNOSIS — R198 Other specified symptoms and signs involving the digestive system and abdomen: Secondary | ICD-10-CM

## 2023-12-04 DIAGNOSIS — Z6826 Body mass index (BMI) 26.0-26.9, adult: Secondary | ICD-10-CM

## 2023-12-04 DIAGNOSIS — L987 Excessive and redundant skin and subcutaneous tissue: Secondary | ICD-10-CM

## 2023-12-04 DIAGNOSIS — Z9884 Bariatric surgery status: Secondary | ICD-10-CM | POA: Diagnosis not present

## 2023-12-04 NOTE — Progress Notes (Addendum)
 Plastic & Reconstructive Surgery New Patient Visit  Patient: Sandy [name] MRN: [id number] Date: 12/04/23   Chief Complaint: Excess skin   History of Present Illness:  This is a 33 y.o. female with PMH and PSH as presented below who presents for consultation for body contouring following massive weight loss.  The patient underwent a Lap RYGB, Cholecystectomy, 5 C-sections and a ventral hernia repair with mesh. Her highest weight was 330 lbs. Since then, she has lost almost 170 lbs. Her weight today is 161.6 lbs and Body mass index is 26.48 kg/m.. With the weight loss she has noted excess skin involving abdomen. Pt complains of rashes in between the skin folds which persist despite antifungal creams and powders.   Follows closely with Bariatrics [facility] Nutrition. No known nutritional issues. No hernias. Pt is otherwise healthy and does not smoke. Functional status is good.  She has chronic pelvic pain and was told that with this surgery her pain may improve.  Past Medical History: Past Medical History:  Diagnosis Date   Bicornate uterus    Complication of anesthesia    itching after C-Sections   Family history of adverse reaction to anesthesia    uncle has a hard time waking up   Genital HSV    GERD (gastroesophageal reflux disease)    Migraine    Obesity affecting pregnancy    BMI>40    Past Surgical History: Past Surgical History:  Procedure Laterality Date   CESAREAN SECTION  2012   for HSV outbreak   CESAREAN SECTION N/A 11/29/2015   Procedure: CESAREAN SECTION Baby Girl 7lb.ruthe.;  Surgeon: Glory High, MD;  Location: [facility] ORS;  Service: Obstetrics;  Laterality: N/A;   CESAREAN SECTION N/A 12/18/2016   Procedure: CESAREAN SECTION;  Surgeon: Leonce Garnette BIRCH, MD;  Location: [facility];  Service: Obstetrics;  Laterality: N/A;   CESAREAN SECTION N/A 03/30/2021   Procedure: CESAREAN SECTION;  Surgeon: High Glory, MD;  Location: [facility];  Service: Obstetrics;   Laterality: N/A;   ears Bilateral    tubes   ESOPHAGOGASTRODUODENOSCOPY (EGD) WITH PROPOFOL  N/A 05/04/2016   Procedure: ESOPHAGOGASTRODUODENOSCOPY (EGD) WITH PROPOFOL ;  Surgeon: Rogelia Copping, MD;  Location[facility];  Service: Endoscopy;  Laterality: N/A;   HERNIA REPAIR  06/18/2019   roux en y  05/2020   STENT PLACEMENT ILIAC ([facility] HX) Right    leg   TONSILLECTOMY      Current Medications: Current Outpatient Medications on File Prior to Visit  Medication Sig Dispense Refill   albuterol  (VENTOLIN  HFA) 108 (90 Base) MCG/ACT inhaler Inhale 2 puffs every four (4) hours as needed for wheezing.     DULoxetine (CYMBALTA) 60 MG capsule Take 60 mg by mouth daily.     gabapentin (NEURONTIN) 100 MG capsule Take 100-300 mg by mouth 3 (three) times daily as needed.     omeprazole  (PRILOSEC) 20 MG capsule Take 1 capsule (20 mg total) by mouth daily. 90 capsule 1   ondansetron  (ZOFRAN ) 4 MG tablet Take 1 tablet (4 mg total) by mouth every 8 (eight) hours as needed for nausea or vomiting. 20 tablet 0   valACYclovir  (VALTREX ) 500 MG tablet Take 500 mg by mouth 2 (two) times daily. for 3 days (Patient taking differently: Take 500 mg by mouth 2 (two) times daily. for 3 days when needed)     No current facility-administered medications on file prior to visit.    Allergies: Allergies  Allergen Reactions   Oxycodone -Acetaminophen  Hives, Itching and  Other (See Comments)    Okay to take tramadol , hydromorphone    Silicone Hives and Other (See Comments)    Eczema break out  OK to use tegaderm/sorbaview for dressing per patient 11/17/21  Eczema break out    Eczema break out OK to use tegaderm/sorbaview for dressing per patient 11/17/21   Metoclopramide  Other (See Comments)   Nsaids     Gastric Bypass    Oxycodone  Hcl    Fluticasone Swelling    Flonase  Mouth swelling    Family History:  Family history is negative for bleeding/clotting disorders, problems with anesthesia, or connective  tissue disorders.   Social History:  Social History   Socioeconomic History   Marital status: Widowed    Spouse name: Not on file   Number of children: Not on file   Years of education: Not on file   Highest education level: Not on file  Occupational History   Not on file  Tobacco Use   Smoking status: Former    Current packs/day: 0.00    Types: Cigarettes    Quit date: 04/09/2013    Years since quitting: 10.6   Smokeless tobacco: Never  Vaping Use   Vaping status: Never Used  Substance and Sexual Activity   Alcohol use: Not Currently    Comment: 2x/month, none during pregnacy   Drug use: No   Sexual activity: Yes    Birth control/protection: Pill  Other Topics Concern   Not on file  Social History Narrative   Not on file   Social Drivers of Health   Financial Resource Strain: Low Risk  (11/27/2022)   Received from [facility]   Overall Financial Resource Strain (CARDIA)    Difficulty of Paying Living Expenses: Not hard at all  Food Insecurity: No Food Insecurity (11/27/2022)   [facility]   Hunger Vital Sign    Within the past 12 months, you worried that your food would run out before you got the money to buy more.: Never true    Within the past 12 months, the food you bought just didn't last and you didn't have money to get more.: Never true  Transportation Needs: No Transportation Needs (11/27/2022)   Received from [facility] - Transportation    Lack of Transportation (Medical): No    Lack of Transportation (Non-Medical): No  Physical Activity: Not on file  Stress: No Stress Concern Present (06/06/2020)   Received from [facility] - Occupational Stress Questionnaire    Feeling of Stress : Not at all  Social Connections: Unknown (08/08/2021)   [facility] from [facility]   Social Network    Social Network: Not on file   Smoker Former Recreational Drug use: Denies  Review of  systems: 10 point review of systems performed and negative except as noted in the HPI  Physical Exam: BP 103/62 (BP Location: Left Arm, Patient Position: Sitting, Cuff Size: Large)   Pulse 93   Ht 5' 5.5 (1.664 m)   Wt 161 lb 9.6 oz (73.3 kg)   SpO2 98%   BMI 26.48 kg/m  Body mass index is 26.48 kg/m.  Physical Exam           General: Well appearing, no apparent distress. Pulm: Breathing comfortably on room air without sounds/wheezing. CV: Regular rate. Good perfusion of extremities. Abdomen: Soft, non-tender, no distension. No palpable hernia or bulge. Approximately 6 cm diastasis. Global lipodystrophy of  abdomen with excess skin in both horizontal and vertical dimensions. Skin excess in infraumbilical region with infraumbilical fat deposits.Overhanging pannus. Redundant/ptotic mons tissue.  Excess skin extends circumferentially along posterior trunk. Well healed surgical scars present.  Neuro: Moving all four extremities spontaneously. Psych: Appropriate mood and affect.  Labs: No results found for this or any previous visit (from the past 2160 hours).   Pertinent Imaging:  None  Assessment: This is a 33 y.o. female with history of massive weight loss and multiple abdominal surgeries including hernia repair with mesh who presents for consultation for body contouring. The patient has maintained a 170  lb weight loss for over 6 months and has excess skin on their abdomen. Based on the patient's exam and discussion of patient's goals today, I believe the patient would be an appropriate candidate for panniculectomy, possible abdominoplasty.   We discussed the patient's goals and priorities at length. We reviewed that body contouring operations trade better contours at the expense of long, sometimes wide scars. We further discussed the operation(s) in detail including the incision and scar patterns, need for surgical drains and importance of postoperative compression. We reviewed that  massive weight loss patients are at a higher risk for recurrent laxity and wound healing problems including dehiscence and infection. We further discussed the risks of bleeding, seroma, hematoma, asymmetries, standing cutaneous deformities or other contour deformities, asymmetry, skin/nipple/umbilical necrosis, fat necrosis/oil cysts, lymphedema, chronic wounds or sinus tracts, hypertrophic and keloid scarring, nerve injury (ie. LFCN, iliohypogastric, ilioinguinal nerves), numbness, paresthesia and sensory changes, intraabdominal injury. Revision procedures are not uncommon. We also discussed the risk of DVT/PE, fat embolism, heart attack, stroke, death as well as the risks of anesthesia. We reviewed the expected recovery period with no heavy activities or lifting >5lbs for 6 weeks postoperatively.   She has chronic pelvic pain and was told that with this surgery her pain may improve. I explain that chronic pelvic pain (CPP) is a complex condition with multiple potential causes--including gynecological, urological, gastrointestinal, musculoskeletal, and neurological factors--so surgery like a panniculectomy, which only removes excess abdominal skin and fat, is unlikely to resolve it. In some cases, surgery may even worsen pain due to nerve irritation, scar tissue formation, or central sensitization. Since panniculectomy does not address the deep pelvic structures typically involved in CPP, it is not considered a treatment for the condition. A multidisciplinary approach involving pelvic floor therapy, pain management, and specialist evaluation is usually more effective before considering any surgical intervention. I will defer management of pelvic pain to her gyn and pain management doctors. Patient expressed understanding and wants to proceed with surgery regardless.   Timing of body contouring procedures should allow 12 months after bariatric surgery and 6 months at a stable weight for patients to achieve  metabolic and nutritional homeostasis.  The patient's BMI today is Body mass index is 26.48 kg/m.SABRA We discussed that patients with BMI>30 are more likely to suffer both minor and major complications following these procedures. The patient verbalized understanding of this concept and is willing to accept these increased risks.   The patient voiced understanding and wishes to proceed. All questions and concerns were addressed to the patient's apparent satisfaction.  Plan - We will plan for CT scan abdomen and pelvis with PO contrast to r/o hernias and herniated bowel - If CT scan is negative we will proceed with panniculectomy versus abdominoplasty.  - 4 hours under general anesthesia. - Will submit for pre-determination with insurance after CT scan is done. -  Scheduling pending insurance.   The sensitive parts of the examination/procedure were performed with MA as chaperone.  The time documented represents the total time spent on the day of the encounter in preparing for and completing the visit. It does not include time spent by ancillary staff, a resident, a fellow, another trainee, or, for shared visits, time spent jointly with the patient or discussing the case or the performance of other separately performed services.  Time spent: 45 minutes.     Kaydyn Chism, MD [facility]  12/04/2023 2:29 PM

## 2023-12-05 ENCOUNTER — Telehealth: Admitting: Nurse Practitioner

## 2023-12-05 DIAGNOSIS — R399 Unspecified symptoms and signs involving the genitourinary system: Secondary | ICD-10-CM | POA: Diagnosis not present

## 2023-12-05 MED ORDER — NITROFURANTOIN MONOHYD MACRO 100 MG PO CAPS: 100.0000 mg | ORAL_CAPSULE | Freq: Two times a day (BID) | ORAL | 0 refills | Status: AC

## 2023-12-05 NOTE — Progress Notes (Signed)

## 2023-12-05 NOTE — Progress Notes (Signed)
 I have spent 5 minutes in review of e-visit questionnaire, review and updating patient chart, medical decision making and response to patient.   Claiborne Rigg, NP

## 2023-12-08 ENCOUNTER — Telehealth

## 2023-12-08 DIAGNOSIS — R110 Nausea: Secondary | ICD-10-CM

## 2023-12-08 NOTE — Progress Notes (Signed)
  Because you were recently treated for a UTI, I feel your condition warrants further evaluation and I recommend that you be seen in a face-to-face visit. This could be an indication for a very serious infection and want you to be seen today!   NOTE: There will be NO CHARGE for this E-Visit   If you are having a true medical emergency, please call 911.     For an urgent face to face visit, [place] has multiple urgent care centers for your convenience.  Click the link below for the full list of locations and hours, walk-in wait times, appointment scheduling options and driving directions:  Urgent Care - [place], [place], [place], [place], [place], [place]  [place]     Your MyChart E-visit questionnaire answers were reviewed by a board certified advanced clinical practitioner to complete your personal care plan based on your specific symptoms.    Thank you for using e-Visits.

## 2023-12-11 NOTE — Telephone Encounter (Signed)
-----   Message from Paraje sent at 12/11/2023 11:00 AM EDT ----- Swab for BV, yeast, and trichomonas is negative No further treatment needed at this time Urine culture is still pending we will call you with results

## 2023-12-18 ENCOUNTER — Telehealth: Payer: Self-pay

## 2023-12-18 NOTE — Telephone Encounter (Signed)
 Faxed signed Release of Medical Records to Medical Records for the Radiology Dept to release/mail copy to our office of CT Imaging on a disc of the Abdomen and Pelvic dated 04/27/2023 or 04/28/2023.

## 2023-12-23 ENCOUNTER — Ambulatory Visit
Admission: RE | Admit: 2023-12-23 | Discharge: 2023-12-23 | Disposition: A | Discharge status: Home / Self Care | Source: Ambulatory Visit

## 2023-12-23 DIAGNOSIS — Z8719 Personal history of other diseases of the digestive system: Secondary | ICD-10-CM

## 2023-12-23 DIAGNOSIS — E65 Localized adiposity: Secondary | ICD-10-CM

## 2023-12-23 DIAGNOSIS — R198 Other specified symptoms and signs involving the digestive system and abdomen: Secondary | ICD-10-CM

## 2023-12-23 DIAGNOSIS — Z9884 Bariatric surgery status: Secondary | ICD-10-CM

## 2023-12-24 ENCOUNTER — Ambulatory Visit (INDEPENDENT_AMBULATORY_CARE_PROVIDER_SITE_OTHER)

## 2023-12-24 VITALS — BP 120/68 | HR 68

## 2023-12-24 DIAGNOSIS — Z09 Encounter for follow-up examination after completed treatment for conditions other than malignant neoplasm: Secondary | ICD-10-CM

## 2023-12-24 NOTE — Progress Notes (Signed)
 Established Patient Office Visit  Subjective   Patient ID: Sandy [name], female    DOB: November 03, 1990  Age: 33 y.o. MRN: [id number]  Chief Complaint  Patient presents with   Follow-up    HPI  33 y.o. female with history of massive weight loss and multiple abdominal surgeries including hernia repair with mesh who presents for follow up after CT scan abdomen and pelvis to discuss results. No changes in general health.   ROS ROS negative except as noted in HPI    Objective:     BP 120/68 (BP Location: Left Arm, Patient Position: Sitting, Cuff Size: Normal)   Pulse 68   SpO2 97%  BP Readings from Last 3 Encounters:  12/24/23 120/68  12/04/23 103/62  05/09/23 121/81    Physical Exam No acute distress. Rest of exam deferred this visit.   Last CBC Lab Results  Component Value Date   WBC 6.8 11/09/2022   HGB 14.6 11/09/2022   HCT 40.5 11/09/2022   MCV 84.9 11/09/2022   MCH 30.6 11/09/2022   RDW 12.1 11/09/2022   PLT 190 11/09/2022   Last metabolic panel Lab Results  Component Value Date   GLUCOSE 85 11/09/2022   NA 137 11/09/2022   K 3.5 11/09/2022   CL 103 11/09/2022   CO2 24 11/09/2022   BUN 13 11/09/2022   CREATININE 0.64 11/09/2022   GFRNONAA >60 11/09/2022   CALCIUM  8.7 (L) 11/09/2022   PHOS 3.4 12/29/2020   PROT 7.0 11/09/2022   ALBUMIN 4.4 11/09/2022   LABGLOB 1.8 01/19/2021   AGRATIO 1.8 01/19/2021   BILITOT 0.7 11/09/2022   ALKPHOS 99 11/09/2022   AST 18 11/09/2022   ALT 16 11/09/2022   ANIONGAP 10 11/09/2022      The ASCVD Risk score (Arnett DK, et al., 2019) failed to calculate for the following reasons:   The 2019 ASCVD risk score is only valid for ages 98 to 26   Risk score cannot be calculated because patient has a medical history suggesting prior/existing ASCVD  CLINICAL DATA:  Ventral hernia.   EXAM: CT ABDOMEN AND PELVIS WITHOUT CONTRAST   TECHNIQUE: Multidetector CT imaging of the abdomen and pelvis was performed following  the standard protocol without IV contrast.   RADIATION DOSE REDUCTION: This exam was performed according to the departmental dose-optimization program which includes automated exposure control, adjustment of the mA and/or kV according to patient size and/or use of iterative reconstruction technique.   COMPARISON:  05/23/2018   FINDINGS: Lower chest: No acute findings.   Hepatobiliary: No mass visualized on this unenhanced exam. Prior cholecystectomy. No evidence of biliary obstruction.   Pancreas: No mass or inflammatory process visualized on this unenhanced exam.   Spleen:  Within normal limits in size.   Adrenals/Urinary tract: No evidence of urolithiasis or hydronephrosis. Unremarkable unopacified urinary bladder.   Stomach/Bowel: Prior gastric bypass surgery noted. No evidence of obstruction, inflammatory process, or abnormal fluid collections.   Vascular/Lymphatic: No pathologically enlarged lymph nodes identified. No evidence of abdominal aortic aneurysm. Stent again seen in left common iliac vein.   Reproductive:  No mass or other significant abnormality.   Other: Patient has undergone surgical repair of previously seen small ventral abdominal wall hernia. No evidence of recurrent hernia or mass.   Musculoskeletal:  No suspicious bone lesions identified.   IMPRESSION: Surgical repair of small ventral abdominal wall hernia since prior study. No evidence of recurrent hernia or other acute findings.     Electronically Signed  By: Norleen DELENA Kil M.D.   On: 12/23/2023 16:25    Assessment & Plan:   Problem List Items Addressed This Visit   None Visit Diagnoses       Follow-up exam    -  Primary       33 y.o. female with history of massive weight loss and multiple abdominal surgeries including hernia repair with mesh who presents for follow up after CT scan abdomen and pelvis to discuss results. No hernia recurrence on CT. Based on the patient's exam and  discussion of patient's goals today, I believe the patient would be an appropriate candidate for panniculectomy. Follow up once surgery is approved by insurance for preoperative visit.   Sandy [name] M. Kimie Pidcock, MD [facility]

## 2023-12-30 NOTE — Addendum Note (Signed)
 Addended by: EUSTACIO POUR on: 12/30/2023 11:39 AM   Modules accepted: Orders

## 2024-01-15 ENCOUNTER — Telehealth: Admitting: Physician Assistant

## 2024-01-15 DIAGNOSIS — R110 Nausea: Secondary | ICD-10-CM

## 2024-01-15 DIAGNOSIS — R519 Headache, unspecified: Secondary | ICD-10-CM

## 2024-01-15 NOTE — Progress Notes (Signed)
  Because you have had multiple visits for nausea/vomiting, I feel your condition warrants further evaluation and I recommend that you be seen in a face-to-face visit.   NOTE: There will be NO CHARGE for this E-Visit   If you are having a true medical emergency, please call 911.     For an urgent face to face visit, [place] has multiple urgent care centers for your convenience.  Click the link below for the full list of locations and hours, walk-in wait times, appointment scheduling options and driving directions:  Urgent Care - [place], [place], [place], [place], [place], [place]  [place]     Your MyChart E-visit questionnaire answers were reviewed by a board certified advanced clinical practitioner to complete your personal care plan based on your specific symptoms.    Thank you for using e-Visits.

## 2024-02-13 ENCOUNTER — Telehealth: Admitting: Physician Assistant

## 2024-02-13 DIAGNOSIS — K121 Other forms of stomatitis: Secondary | ICD-10-CM

## 2024-02-13 MED ORDER — CHLORHEXIDINE GLUCONATE 0.12 % MT SOLN: OROMUCOSAL | 0 refills | Status: AC

## 2024-02-13 NOTE — Progress Notes (Signed)
 E-Visit for Mouth Ulcers  We are sorry that you are not feeling well.  Here is how we plan to help!  Based on what you have shared with me, it appears that you do have mouth ulcer(s).     The following medications should decrease the discomfort and help with healing. Orajel (Available over the counter), Multivitamin daily, and Chlorhexidine  mouthwash daily for 5 days   Mouth ulcers are painful areas in the mouth and gums. These are also known as "canker sores".  They can occur anywhere inside the mouth. While mostly harmless, mouth ulcers can be extremely uncomfortable and may make it difficult to eat, drink, and brush your teeth.  You may have more than 1 ulcer and they can vary and change in size. Mouth ulcers are not contagious and should not be confused with cold sores.  Cold sores appear on the lip or around the outside of the mouth and often begin with a tingling, burning or itching sensation.   While the exact causes are unknown, some common causes and factors that may aggravate mouth ulcers include: Genetics - Sometimes mouth ulcers run in families High alcohol intake Acidic foods such as citrus fruits like pineapple, grapefruit, orange fruits/juices, may aggravate mouth ulcers Other foods high in acidity or spice such as coffee, chocolate, chips, pretzels, eggs, nuts, cheese Quitting smoking Injury caused by biting the tongue or inside of the cheek Diet lacking in B-12, zinc, folic acid or iron Female hormone shifts with menstruation Excessive fatigue, emotional stress or anxiety Prevention: Talk to your doctor if you are taking meds that are known to cause mouth ulcers such as:   Anti-inflammatory drugs (for example Ibuprofen , Naproxen  sodium), pain killers, Beta blockers, Oral nicotine replacement drugs, Some street drugs (heroin).   Avoid allowing any tablets to dissolve in your mouth that are meant to swallowed whole Avoid foods/drinks that trigger or worsen symptoms Keep your  mouth clean with daily brushing and flossing  Home Care: The goal with treatment is to ease the pain where ulcers occur and help them heal as quickly as possible.  There is no medical treatment to prevent mouth ulcers from coming back or recurring.  Avoid spicy and acidic foods Eat soft foods and avoid rough, crunchy foods Avoid chewing gum Do not use toothpaste that contains sodium lauryl sulphite Use a straw to drink which helps avoid liquids toughing the ulcers near the front of your mouth Use a very soft toothbrush If you have dentures or dental hardware that you feel is not fitting well or contributing to his, please see your dentist. Use saltwater mouthwash which helps healing. Dissolve a  teaspoon of salt in a glass of warm water . Swish around your mouth and spit it out. This can be used as needed if it is soothing.   GET HELP RIGHT AWAY IF: Persistent ulcers require checking IN PERSON (face to face). Any mouth lesion lasting longer than a month should be seen by your DENTIST as soon as possible for evaluation for possible oral cancer. If you have a non-painful ulcer in 1 or more areas of your mouth Ulcers that are spreading, are very large or particularly painful Ulcers last longer than one week without improving on treatment If you develop a fever, swollen glands and begin to feel unwell Ulcers that developed after starting a new medication MAKE SURE YOU: Understand these instructions. Will watch your condition. Will get help right away if you are not doing well or get worse.  Thank you for choosing an e-visit.  Your e-visit answers were reviewed by a board certified advanced clinical practitioner to complete your personal care plan. Depending upon the condition, your plan could have included both over the counter or prescription medications.  Please review your pharmacy choice. Make sure the pharmacy is open so you can pick up prescription now. If there is a problem, you may  contact your provider through [employer] and have the prescription routed to another pharmacy.  Your safety is important to us . If you have drug allergies check your prescription carefully.   For the next 24 hours you can use MyChart to ask questions about today's visit, request a non-urgent call back, or ask for a work or school excuse. You will get an email in the next two days asking about your experience. I hope that your e-visit has been valuable and will speed your recovery.  I have spent 5 minutes in review of e-visit questionnaire, review and updating patient chart, medical decision making and response to patient.   Binta Statzer, PA-C

## 2024-02-28 ENCOUNTER — Telehealth: Payer: Self-pay | Admitting: Physician Assistant

## 2024-02-28 DIAGNOSIS — S39012A Strain of muscle, fascia and tendon of lower back, initial encounter: Secondary | ICD-10-CM

## 2024-02-28 MED ORDER — CYCLOBENZAPRINE HCL 10 MG PO TABS: 5.0000 mg | ORAL_TABLET | Freq: Three times a day (TID) | ORAL | 0 refills | Status: AC | PRN

## 2024-02-28 NOTE — Progress Notes (Signed)
 We are sorry that you are not feeling well.  Here is how we plan to help!  Based on what you have shared with me it looks like you mostly have acute back pain.  Acute back pain is defined as musculoskeletal pain that can resolve in 1-3 weeks with conservative treatment.  I have prescribed Flexeril  10 mg every eight hours as needed which is a muscle relaxer.  Please keep in mind that muscle relaxer's can cause fatigue and should not be taken while at work or driving.  Back pain is very common.  The pain often gets better over time.  The cause of back pain is usually not dangerous.  Most people can learn to manage their back pain on their own.  Home Care Stay active.  Start with short walks on flat ground if you can.  Try to walk farther each day. Do not sit, drive or stand in one place for more than 30 minutes.  Do not stay in bed. Do not avoid exercise or work.  Activity can help your back heal faster. Be careful when you bend or lift an object.  Bend at your knees, keep the object close to you, and do not twist. Sleep on a firm mattress.  Lie on your side, and bend your knees.  If you lie on your back, put a pillow under your knees. Only take medicines as told by your doctor. Put ice on the injured area. Put ice in a plastic bag Place a towel between your skin and the bag Leave the ice on for 15-20 minutes, 3-4 times a day for the first 2-3 days. 210 After that, you can switch between ice and heat packs. Ask your doctor about back exercises or massage. Avoid feeling anxious or stressed.  Find good ways to deal with stress, such as exercise.  Get Help Right Way If: Your pain does not go away with rest or medicine. Your pain does not go away in 1 week. You have new problems. You do not feel well. The pain spreads into your legs. You cannot control when you poop (bowel movement) or pee (urinate) You feel sick to your stomach (nauseous) or throw up (vomit) You have belly (abdominal)  pain. You feel like you may pass out (faint). If you develop a fever.  Make Sure you: Understand these instructions. Will watch your condition Will get help right away if you are not doing well or get worse.  Your e-visit answers were reviewed by a board certified advanced clinical practitioner to complete your personal care plan.  Depending on the condition, your plan could have included both over the counter or prescription medications.  If there is a problem please reply  once you have received a response from your provider.  Your safety is important to us .  If you have drug allergies check your prescription carefully.    You can use MyChart to ask questions about today's visit, request a non-urgent call back, or ask for a work or school excuse for 24 hours related to this e-Visit. If it has been greater than 24 hours you will need to follow up with your provider, or enter a new e-Visit to address those concerns.  You will get an e-mail in the next two days asking about your experience.  I hope that your e-visit has been valuable and will speed your recovery. Thank you for using e-visits.   I have spent 5 minutes in review of e-visit questionnaire, review and  updating patient chart, medical decision making and response to patient.   Delon CHRISTELLA Dickinson, PA-C

## 2024-03-19 ENCOUNTER — Telehealth: Payer: Self-pay | Admitting: Physician Assistant

## 2024-03-19 DIAGNOSIS — R42 Dizziness and giddiness: Secondary | ICD-10-CM

## 2024-03-19 NOTE — Progress Notes (Signed)
°  Because of active dizziness, headache, etc., I feel your condition warrants further evaluation and I recommend that you be seen in an in-person visit.   NOTE: There will be NO CHARGE for this E-Visit   If you are having a true medical emergency, please call 911.     For an urgent face to face visit, [facility] has multiple urgent care centers for your convenience.  Click the link below for the full list of locations and hours, walk-in wait times, appointment scheduling options and driving directions:  Urgent Care - [place], [place], [place], [place], [place], [place]  [place]     Your MyChart E-visit questionnaire answers were reviewed by a board certified advanced clinical practitioner to complete your personal care plan based on your specific symptoms.    Thank you for using e-Visits.

## 2024-04-04 ENCOUNTER — Telehealth: Payer: Self-pay | Admitting: Nurse Practitioner

## 2024-04-04 DIAGNOSIS — J101 Influenza due to other identified influenza virus with other respiratory manifestations: Secondary | ICD-10-CM

## 2024-04-04 MED ORDER — OSELTAMIVIR PHOSPHATE 75 MG PO CAPS: 75.0000 mg | ORAL_CAPSULE | Freq: Two times a day (BID) | ORAL | 0 refills | Status: AC

## 2024-04-04 NOTE — Progress Notes (Signed)
 E visit for Flu like symptoms   We are sorry that you are not feeling well.  Here is how we plan to help! Based on what you have shared with me it looks like you may have possible exposure to a virus that causes influenza.  Influenza or the flu is  an infection caused by a respiratory virus. The flu virus is highly contagious and persons who did not receive their yearly flu vaccination may catch the flu from close contact.  We have anti-viral medications to treat the viruses that cause this infection. They are not a cure and only shorten the course of the infection. These prescriptions are most effective when they are given within the first 2 days of flu symptoms. Antiviral medications are indicated if you have a high risk of complications from the flu. You should  also consider an antiviral medication if you are in close contact with someone who is at risk. These medications can help patients avoid complications from the flu but have side effects that you should know.   Possible side effects from Tamiflu  or oseltamivir  include nausea, vomiting, diarrhea, dizziness, headaches, eye redness, sleep problems or other respiratory symptoms. You should not take Tamiflu  if you have an allergy to oseltamivir  or any to the ingredients in Tamiflu .  Based upon your symptoms and potential risk factors I have prescribed Oseltamivir  (Tamiflu ).  It has been sent to your designated pharmacy.  You will take one 75 mg capsule orally twice a day for the next 5 days.   For nasal congestion, you may use an oral decongestant such as Mucinex D or if you have glaucoma or high blood pressure use plain Mucinex.  Saline nasal spray or nasal drops can help and can safely be used as often as needed for congestion.  If you have a sore or scratchy throat, use a saltwater gargle-  to  teaspoon of salt dissolved in a 4-ounce to 8-ounce glass of warm water .  Gargle the solution for approximately 15-30 seconds and then spit.   It is important not to swallow the solution.  You can also use throat lozenges/cough drops and Chloraseptic spray to help with throat pain or discomfort.  Warm or cold liquids can also be helpful in relieving throat pain.  For headache, pain or general discomfort, you can use Ibuprofen  or Tylenol  as directed.   Some authorities believe that zinc sprays or the use of Echinacea may shorten the course of your symptoms.  I have also prescribed the following medications to help lessen symptoms: Motrin  600 mg   You are to isolate at home until you have been fever-free for at least 24 hours without a fever-reducing medication, and symptoms have been steadily improving for 24 hours.  If you must be around other household members who do not have symptoms, you need to make sure that both you and the family members are masking consistently with a high-quality mask.  If you note any worsening of symptoms despite treatment, please seek an in-person evaluation ASAP. If you note any significant shortness of breath or any chest pain, please seek ED evaluation. Please do not delay care!  ANYONE WHO HAS FLU SYMPTOMS SHOULD: Stay home. The flu is highly contagious and going out or to work exposes others! Be sure to drink plenty of fluids. Water  is fine as well as fruit juices, sodas and electrolyte beverages. You may want to stay away from caffeine or alcohol. If you are nauseated, try taking small sips  of liquids. How do you know if you are getting enough fluid? Your urine should be a pale yellow or almost colorless. Get rest. Taking a steamy shower or using a humidifier may help nasal congestion and ease sore throat pain. Using a saline nasal spray works much the same way. Cough drops, hard candies and sore throat lozenges may ease your cough. Line up a caregiver. Have someone check on you regularly.  GET HELP RIGHT AWAY IF: You cannot keep down liquids or your medications. You become short of breath Your fell  like you are going to pass out or loose consciousness. Your symptoms persist after you have completed your treatment plan  MAKE SURE YOU  Understand these instructions. Will watch your condition. Will get help right away if you are not doing well or get worse.  Your e-visit answers were reviewed by a board certified advanced clinical practitioner to complete your personal care plan.  Depending on the condition, your plan could have included both over the counter or prescription medications.  If there is a problem please reply  once you have received a response from your provider.  Your safety is important to us .  If you have drug allergies check your prescription carefully.    You can use MyChart to ask questions about todays visit, request a non-urgent call back, or ask for a work or school excuse for 24 hours related to this e-Visit. If it has been greater than 24 hours you will need to follow up with your provider, or enter a new e-Visit to address those concerns.  You will get an e-mail in the next two days asking about your experience.  I hope that your e-visit has been valuable and will speed your recovery. Thank you for using e-visits.   I have spent 5 minutes in review of e-visit questionnaire, review and updating patient chart, medical decision making and response to patient.   Ladoris Lythgoe W Jazmyne Beauchesne, NP

## 2024-04-27 ENCOUNTER — Telehealth: Payer: Self-pay | Admitting: Physician Assistant

## 2024-04-27 DIAGNOSIS — J069 Acute upper respiratory infection, unspecified: Secondary | ICD-10-CM

## 2024-04-27 MED ORDER — AZELASTINE HCL 0.1 % NA SOLN: 2.0000 | Freq: Two times a day (BID) | NASAL | 12 refills | Status: AC

## 2024-04-27 MED ORDER — PSEUDOEPH-BROMPHEN-DM 30-2-10 MG/5ML PO SYRP: 10.0000 mL | ORAL_SOLUTION | Freq: Four times a day (QID) | ORAL | 0 refills | Status: AC | PRN

## 2024-04-27 NOTE — Progress Notes (Signed)

## 2024-05-01 ENCOUNTER — Telehealth: Payer: Self-pay | Admitting: Family Medicine

## 2024-05-01 DIAGNOSIS — M545 Low back pain, unspecified: Secondary | ICD-10-CM

## 2024-05-01 MED ORDER — BACLOFEN 10 MG PO TABS: 10.0000 mg | ORAL_TABLET | Freq: Three times a day (TID) | ORAL | 0 refills | Status: AC

## 2024-05-01 NOTE — Progress Notes (Signed)
 We are sorry that you are not feeling well.  Here is how we plan to help!  Based on what you have shared with me it, appears you may be experiencing acute back pain.   Acute back pain is defined as musculoskeletal pain that can resolve in 1-3 weeks with conservative treatment.  I have prescribed a non-steroid anti-inflammatory (NSAID) Naprosyn 500 mg -- take one by mouth twice a day, as well as a muscle relaxant, Baclofen  10 mg every eight hours as needed. Some patients experience stomach irritation or in increased heartburn with anti-inflammatory drugs.  Please keep in mind that muscle relaxer's can cause fatigue and should not be taken while at work or driving.  Back pain is very common.  The pain often gets better over time.  The cause of back pain is usually not dangerous.  Most people can learn to manage their back pain on their own.  Home Care Stay active.  Start with short walks on flat ground if you can.  Try to walk farther each day. Do not sit, drive or stand in one place for more than 30 minutes.  Do not stay in bed. Do not fully avoid exercise or work.  Activity can help your back heal faster. Be careful when you bend or lift an object.  Bend at your knees, keep the object close to you, and do not twist. Sleep on a firm mattress.  Lie on your side, and bend your knees.  If you lie on your back, put a pillow under your knees. Only take medicines as told by your doctor. Put ice on the injured area. Put ice in a plastic bag Place a towel between your skin and the bag Leave the ice on for 15-20 minutes, 3-4 times a day for the first 2-3 days.  After that, you can switch between ice and heat packs. Ask your doctor about back exercises or massage.   Get Help Right Way If: Your pain does not go away with rest and treatments given today. Your pain does not go away within 1 week. You have new problems. You do not feel well. The pain spreads into your legs. You cannot control when you  poop (bowel movement) or pee (urinate). You feel sick to your stomach (nauseous) or throw up (vomit). You have belly (abdominal) pain. You feel like you may pass out (faint). If you develop a fever.  Make Sure you: Understand these instructions. Continue to monitor your condition for any changes. Will get help right away if you are not doing well or get worse.  Your e-visit answers were reviewed by a board certified advanced clinical practitioner to complete your personal care plan.  Depending on the condition, your plan could have included both over the counter or prescription medications.  If there is a problem, please reply once you have received a response from your provider.  Your safety is important to us .  If you have drug allergies, check your prescription carefully.    You can use MyChart to ask questions about todays visit, request a non-urgent call back, or ask for a work or school excuse for 24 hours related to this e-Visit. If it has been greater than 24 hours you will need to follow up with your provider or enter a new e-Visit to address those concerns.  You will get an e-mail in the next two days asking about your experience.  I hope that your e-visit has been valuable and will speed your  recovery. Thank you for using e-visits.   I have spent 5 minutes in review of e-visit questionnaire, review and updating patient chart, medical decision making and response to patient.   Belem Hintze, FNP
